# Patient Record
Sex: Female | Born: 1960 | Race: Black or African American | Hispanic: No | Marital: Married | Smoking: Never smoker
Health system: Southern US, Community
[De-identification: ages and names within clinical notes are randomized; demographics above are authoritative.]

## PROBLEM LIST (undated history)

## (undated) DIAGNOSIS — K219 Gastro-esophageal reflux disease without esophagitis: Secondary | ICD-10-CM

## (undated) DIAGNOSIS — H269 Unspecified cataract: Secondary | ICD-10-CM

## (undated) DIAGNOSIS — M359 Systemic involvement of connective tissue, unspecified: Secondary | ICD-10-CM

## (undated) DIAGNOSIS — M810 Age-related osteoporosis without current pathological fracture: Secondary | ICD-10-CM

## (undated) DIAGNOSIS — Z5189 Encounter for other specified aftercare: Secondary | ICD-10-CM

## (undated) DIAGNOSIS — E785 Hyperlipidemia, unspecified: Secondary | ICD-10-CM

## (undated) DIAGNOSIS — F419 Anxiety disorder, unspecified: Secondary | ICD-10-CM

## (undated) DIAGNOSIS — F32A Depression, unspecified: Secondary | ICD-10-CM

## (undated) DIAGNOSIS — M1990 Unspecified osteoarthritis, unspecified site: Secondary | ICD-10-CM

## (undated) DIAGNOSIS — D649 Anemia, unspecified: Secondary | ICD-10-CM

## (undated) DIAGNOSIS — K3532 Acute appendicitis with perforation and localized peritonitis, without abscess: Secondary | ICD-10-CM

## (undated) DIAGNOSIS — H409 Unspecified glaucoma: Secondary | ICD-10-CM

## (undated) DIAGNOSIS — G4730 Sleep apnea, unspecified: Secondary | ICD-10-CM

## (undated) HISTORY — DX: Unspecified glaucoma: H40.9

## (undated) HISTORY — PX: APPENDECTOMY: SHX54

## (undated) HISTORY — DX: Anxiety disorder, unspecified: F41.9

## (undated) HISTORY — DX: Systemic involvement of connective tissue, unspecified: M35.9

## (undated) HISTORY — DX: Depression, unspecified: F32.A

## (undated) HISTORY — DX: Unspecified osteoarthritis, unspecified site: M19.90

## (undated) HISTORY — DX: Hyperlipidemia, unspecified: E78.5

## (undated) HISTORY — DX: Age-related osteoporosis without current pathological fracture: M81.0

## (undated) HISTORY — PX: BREAST BIOPSY: SHX20

## (undated) HISTORY — DX: Encounter for other specified aftercare: Z51.89

## (undated) HISTORY — DX: Unspecified cataract: H26.9

## (undated) HISTORY — DX: Acute appendicitis with perforation and localized peritonitis, without abscess: K35.32

## (undated) HISTORY — DX: Sleep apnea, unspecified: G47.30

---
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
## 1983-09-22 HISTORY — PX: TUBAL LIGATION: SHX77

## 2011-12-07 ENCOUNTER — Emergency Department: Payer: Self-pay | Admitting: Emergency Medicine

## 2011-12-12 ENCOUNTER — Ambulatory Visit: Payer: Self-pay

## 2013-12-06 ENCOUNTER — Ambulatory Visit: Payer: Self-pay

## 2015-01-14 ENCOUNTER — Ambulatory Visit
Admit: 2015-01-14 | Disposition: A | Discharge status: Home / Self Care | Payer: Self-pay | Attending: Urgent Care | Admitting: Urgent Care

## 2015-01-18 ENCOUNTER — Other Ambulatory Visit: Payer: Self-pay | Admitting: Oncology

## 2015-01-18 DIAGNOSIS — R928 Other abnormal and inconclusive findings on diagnostic imaging of breast: Secondary | ICD-10-CM

## 2015-01-24 ENCOUNTER — Ambulatory Visit
Admission: RE | Admit: 2015-01-24 | Discharge: 2015-01-24 | Disposition: A | Discharge status: Home / Self Care | Payer: Self-pay | Source: Ambulatory Visit | Attending: Oncology | Admitting: Oncology

## 2015-01-24 DIAGNOSIS — R928 Other abnormal and inconclusive findings on diagnostic imaging of breast: Secondary | ICD-10-CM | POA: Insufficient documentation

## 2015-02-04 ENCOUNTER — Encounter: Payer: Self-pay | Admitting: *Deleted

## 2015-02-04 NOTE — Progress Notes (Signed)
Letter mailed from the [facility] to inform patient of her normal mammogram results.  Patient is to follow-up with annual screening in one year.  HSIS to Christy. 

## 2016-01-29 ENCOUNTER — Ambulatory Visit: Payer: Self-pay | Attending: Oncology | Admitting: *Deleted

## 2016-01-29 ENCOUNTER — Ambulatory Visit
Admission: RE | Admit: 2016-01-29 | Discharge: 2016-01-29 | Disposition: A | Discharge status: Home / Self Care | Payer: Self-pay | Source: Ambulatory Visit | Attending: Oncology | Admitting: Oncology

## 2016-01-29 ENCOUNTER — Encounter: Payer: Self-pay | Admitting: *Deleted

## 2016-01-29 VITALS — BP 117/97 | HR 90 | Temp 98.0°F | Ht 67.32 in | Wt 155.8 lb

## 2016-01-29 DIAGNOSIS — Z0000 Encounter for general adult medical examination without abnormal findings: Secondary | ICD-10-CM

## 2016-01-29 NOTE — Patient Instructions (Signed)
Gave patient hand-out, Women Staying Healthy, Active and Well from [facility], with education on breast health, pap smears, heart and colon health. 

## 2016-01-29 NOTE — Progress Notes (Signed)
Subjective:     Patient ID: Frances Davidson, female   DOB: 1960-12-26, 55 y.o.   MRN: [id number]  HPI   Review of Systems     Objective:   Physical Exam  Pulmonary/Chest: Right breast exhibits no inverted nipple, no mass, no nipple discharge, no skin change and no tenderness. Left breast exhibits no inverted nipple, no mass, no nipple discharge, no skin change and no tenderness. Breasts are asymmetrical.    Left breast larger than the right breast  Abdominal: There is no splenomegaly or hepatomegaly.  Genitourinary: Rectal exam shows external hemorrhoid. There is no rash, tenderness, lesion or injury on the right labia. There is no rash, tenderness, lesion or injury on the left labia. Cervix exhibits no motion tenderness, no discharge and no friability. Right adnexum displays no mass, no tenderness and no fullness. Left adnexum displays no mass, no tenderness and no fullness. No erythema, tenderness or bleeding in the vagina. No foreign body around the vagina. No signs of injury around the vagina. No vaginal discharge found.       Assessment:     55 year old Black female returns to [facility] for annual screening.  Husband is present during the interview and exam.  Clinical breast exam unremarkable.  Taught self breast awareness.  Pelvic exam without masses or lesions.  Last pap on 12/06/13 was HPV negative and normal.  There is an irregular shaped, dark mole at the left upper chest. Encouraged patient to follow up with a primary care provider for further evaluation of the mole.  Jeanella Anton to give patient numbers to the [facility].  Patient has been screened for eligibility.  She does not have any insurance, Medicare or Medicaid.  She also meets financial eligibility.  Hand-out given on the Affordable Care Act.     Plan:     Screening mammogram ordered.  Informed next pap will be due 2020.  Will follow-up per [facility] protocol

## 2016-01-30 ENCOUNTER — Encounter: Payer: Self-pay | Admitting: *Deleted

## 2016-01-30 NOTE — Progress Notes (Signed)
Letter mailed from the [facility] to inform patient of her normal mammogram results.  Patient is to follow-up with annual screening in one year.  HSIS to Christy. 

## 2016-07-22 DIAGNOSIS — K3532 Acute appendicitis with perforation and localized peritonitis, without abscess: Secondary | ICD-10-CM

## 2016-07-22 HISTORY — DX: Acute appendicitis with perforation, localized peritonitis, and gangrene, without abscess: K35.32

## 2016-08-16 ENCOUNTER — Emergency Department: Payer: Self-pay

## 2016-08-16 ENCOUNTER — Encounter: Payer: Self-pay | Admitting: Emergency Medicine

## 2016-08-16 ENCOUNTER — Inpatient Hospital Stay
Admission: EM | Admit: 2016-08-16 | Discharge: 2016-08-20 | DRG: 373 | Disposition: A | Discharge status: Home / Self Care | Payer: Self-pay | Attending: Surgery | Admitting: Surgery

## 2016-08-16 DIAGNOSIS — K5900 Constipation, unspecified: Secondary | ICD-10-CM | POA: Diagnosis present

## 2016-08-16 DIAGNOSIS — F17200 Nicotine dependence, unspecified, uncomplicated: Secondary | ICD-10-CM | POA: Diagnosis present

## 2016-08-16 DIAGNOSIS — K353 Acute appendicitis with localized peritonitis: Principal | ICD-10-CM | POA: Diagnosis present

## 2016-08-16 DIAGNOSIS — K3533 Acute appendicitis with perforation and localized peritonitis, with abscess: Secondary | ICD-10-CM | POA: Diagnosis present

## 2016-08-16 LAB — COMPREHENSIVE METABOLIC PANEL
ALT: 13 U/L — ABNORMAL LOW (ref 14–54)
ANION GAP: 9 (ref 5–15)
AST: 20 U/L (ref 15–41)
Albumin: 3.8 g/dL (ref 3.5–5.0)
Alkaline Phosphatase: 67 U/L (ref 38–126)
BUN: 10 mg/dL (ref 6–20)
CHLORIDE: 103 mmol/L (ref 101–111)
CO2: 27 mmol/L (ref 22–32)
CREATININE: 1.17 mg/dL — AB (ref 0.44–1.00)
Calcium: 8.9 mg/dL (ref 8.9–10.3)
GFR, EST AFRICAN AMERICAN: 59 mL/min — AB (ref >60)
GFR, EST NON AFRICAN AMERICAN: 51 mL/min — AB (ref >60)
Glucose, Bld: 110 mg/dL — ABNORMAL HIGH (ref 65–99)
Potassium: 3.6 mmol/L (ref 3.5–5.1)
SODIUM: 139 mmol/L (ref 135–145)
Total Bilirubin: 0.8 mg/dL (ref 0.3–1.2)
Total Protein: 8 g/dL (ref 6.5–8.1)

## 2016-08-16 LAB — URINALYSIS COMPLETE WITH MICROSCOPIC (ARMC ONLY)
BILIRUBIN URINE: NEGATIVE
Glucose, UA: NEGATIVE mg/dL
HGB URINE DIPSTICK: NEGATIVE
Nitrite: NEGATIVE
PH: 5 (ref 5.0–8.0)
PROTEIN: 30 mg/dL — AB
Specific Gravity, Urine: 1.027 (ref 1.005–1.030)

## 2016-08-16 LAB — CBC
HCT: 36.2 % (ref 35.0–47.0)
HEMOGLOBIN: 12.2 g/dL (ref 12.0–16.0)
MCH: 30.4 pg (ref 26.0–34.0)
MCHC: 33.7 g/dL (ref 32.0–36.0)
MCV: 90.2 fL (ref 80.0–100.0)
PLATELETS: 322 10*3/uL (ref 150–440)
RBC: 4.01 MIL/uL (ref 3.80–5.20)
RDW: 13.4 % (ref 11.5–14.5)
WBC: 14.7 10*3/uL — AB (ref 3.6–11.0)

## 2016-08-16 LAB — LIPASE, BLOOD: LIPASE: 14 U/L (ref 11–51)

## 2016-08-16 MED ORDER — SODIUM CHLORIDE 0.9% FLUSH
3.0000 mL | INTRAVENOUS | Status: DC | PRN
Start: 2016-08-16 — End: 2016-08-20

## 2016-08-16 MED ORDER — SODIUM CHLORIDE 0.9% FLUSH
3.0000 mL | Freq: Two times a day (BID) | INTRAVENOUS | Status: DC
  Administered 2016-08-16 – 2016-08-19 (×7): 3 mL via INTRAVENOUS

## 2016-08-16 MED ORDER — IOPAMIDOL (ISOVUE-300) INJECTION 61%
100.0000 mL | Freq: Once | INTRAVENOUS | Status: AC | PRN
Start: 2016-08-16 — End: 2016-08-16
  Administered 2016-08-16: 100 mL via INTRAVENOUS
  Filled 2016-08-16: qty 100

## 2016-08-16 MED ORDER — MORPHINE SULFATE (PF) 4 MG/ML IV SOLN: 2.0000 mg | INTRAVENOUS | Status: DC | PRN

## 2016-08-16 MED ORDER — MORPHINE SULFATE (PF) 4 MG/ML IV SOLN
4.0000 mg | Freq: Once | INTRAVENOUS | Status: AC
  Administered 2016-08-16: 4 mg via INTRAVENOUS
  Filled 2016-08-16: qty 1

## 2016-08-16 MED ORDER — ACETAMINOPHEN 325 MG PO TABS: 650.0000 mg | ORAL_TABLET | Freq: Four times a day (QID) | ORAL | Status: DC | PRN

## 2016-08-16 MED ORDER — ONDANSETRON HCL 4 MG/2ML IJ SOLN
4.0000 mg | Freq: Once | INTRAMUSCULAR | Status: AC
  Administered 2016-08-16: 4 mg via INTRAVENOUS
  Filled 2016-08-16: qty 2

## 2016-08-16 MED ORDER — PIPERACILLIN-TAZOBACTAM 3.375 G IVPB
3.3750 g | Freq: Three times a day (TID) | INTRAVENOUS | Status: DC
  Administered 2016-08-17 – 2016-08-19 (×7): 3.375 g via INTRAVENOUS
  Filled 2016-08-16 (×9): qty 50

## 2016-08-16 MED ORDER — HYDROMORPHONE HCL 1 MG/ML IJ SOLN
1.0000 mg | Freq: Once | INTRAMUSCULAR | Status: AC
  Administered 2016-08-16: 1 mg via INTRAVENOUS

## 2016-08-16 MED ORDER — HEPARIN SODIUM (PORCINE) 5000 UNIT/ML IJ SOLN
5000.0000 [IU] | Freq: Three times a day (TID) | INTRAMUSCULAR | Status: DC
  Administered 2016-08-16 – 2016-08-20 (×9): 5000 [IU] via SUBCUTANEOUS
  Filled 2016-08-16 (×10): qty 1

## 2016-08-16 MED ORDER — HYDROCODONE-ACETAMINOPHEN 5-325 MG PO TABS
1.0000 | ORAL_TABLET | ORAL | Status: DC | PRN
  Administered 2016-08-17 – 2016-08-18 (×2): 2 via ORAL
  Administered 2016-08-19: 1 via ORAL
  Filled 2016-08-16: qty 1
  Filled 2016-08-16: qty 2
  Filled 2016-08-16 (×2): qty 1

## 2016-08-16 MED ORDER — ONDANSETRON HCL 4 MG PO TABS
4.0000 mg | ORAL_TABLET | Freq: Four times a day (QID) | ORAL | Status: DC | PRN
  Filled 2016-08-16: qty 1

## 2016-08-16 MED ORDER — PIPERACILLIN-TAZOBACTAM 3.375 G IVPB 30 MIN
3.3750 g | Freq: Once | INTRAVENOUS | Status: AC
  Administered 2016-08-16: 3.375 g via INTRAVENOUS
  Filled 2016-08-16 (×2): qty 50

## 2016-08-16 MED ORDER — MORPHINE SULFATE (PF) 2 MG/ML IV SOLN: 2.0000 mg | INTRAVENOUS | Status: DC | PRN

## 2016-08-16 MED ORDER — HYDROMORPHONE HCL 1 MG/ML IJ SOLN
INTRAMUSCULAR | Status: AC
  Administered 2016-08-16: 1 mg via INTRAVENOUS
  Filled 2016-08-16: qty 1

## 2016-08-16 MED ORDER — SODIUM CHLORIDE 0.9 % IV SOLN: 250.0000 mL | INTRAVENOUS | Status: DC | PRN

## 2016-08-16 MED ORDER — ONDANSETRON HCL 4 MG/2ML IJ SOLN: 4.0000 mg | Freq: Four times a day (QID) | INTRAMUSCULAR | Status: DC | PRN

## 2016-08-16 MED ORDER — IOPAMIDOL (ISOVUE-300) INJECTION 61%
30.0000 mL | Freq: Once | INTRAVENOUS | Status: AC | PRN
  Administered 2016-08-16: 30 mL via ORAL
  Filled 2016-08-16: qty 30

## 2016-08-16 MED ORDER — SODIUM CHLORIDE 0.9 % IV BOLUS (SEPSIS)
1000.0000 mL | Freq: Once | INTRAVENOUS | Status: AC
  Administered 2016-08-16: 1000 mL via INTRAVENOUS

## 2016-08-16 NOTE — ED Triage Notes (Signed)
Patient to ER for c/o RLQ abd pain. States she has not had BM in 1.5 weeks. Patient reports feeling "bulge" to RLQ that is painful. Patient has been taking medications to help with BM, but has been unsuccessful.

## 2016-08-16 NOTE — ED Notes (Signed)
ED Provider at bedside. 

## 2016-08-16 NOTE — ED Provider Notes (Signed)
[facility] Emergency Department Provider Note  Time seen: 2:19 PM  I have reviewed the triage vital signs and the nursing notes.   HISTORY  Chief Complaint Abdominal Pain    HPI Frances Davidson is a 55 y.o. female presents to the emergency department with right-sided abdominal pain and constipation. According to the patient for the past 1.5 weeks she has been constipated unable to have a bowel movement. States she has now developed right sided abdominal pain over the past few days which has progressively worsen. States she is belching a lot and feeling nauseated but has not vomited. Denies fever but states she has been experiencing chills at home. Denies dysuria. States prior to the constipation she was having very hard small bowel movements. Patient has taken a laxative without any relief. Describes the pain as an 8/10 dull aching pain on the right side of her abdomen.  History reviewed. No pertinent past medical history.  There are no active problems to display for this patient.   Past Surgical History:  Procedure Laterality Date  . BREAST BIOPSY Left     Prior to Admission medications   Not on File    No Known Allergies  No family history on file.  Social History Social History  Substance Use Topics  . Smoking status: Current Some Day Smoker  . Smokeless tobacco: Never Used  . Alcohol use No    Review of Systems Constitutional: Negative for fever. Cardiovascular: Negative for chest pain. Respiratory: Negative for shortness of breath. Gastrointestinal: Right-sided abdominal pain, nausea, positive for constipation. Negative for vomiting or diarrhea. Genitourinary: Negative for dysuria. Musculoskeletal: Negative for back pain. Neurological: Negative for headache 10-point ROS otherwise negative.  ____________________________________________   PHYSICAL EXAM:  VITAL SIGNS: ED Triage Vitals  Enc Vitals Group     BP 08/16/16 1315 109/81      Pulse Rate 08/16/16 1315 (!) 108     Resp 08/16/16 1315 20     Temp 08/16/16 1315 99 F (37.2 C)     Temp Source 08/16/16 1315 Oral     SpO2 08/16/16 1315 98 %     Weight 08/16/16 1316 155 lb (70.3 kg)     Height 08/16/16 1316 5\' 6"  (1.676 m)     Head Circumference --      Peak Flow --      Pain Score 08/16/16 1316 8     Pain Loc --      Pain Edu? --      Excl. in [place]? --     Constitutional: Alert and oriented. Well appearing and in no distress. Eyes: Normal exam ENT   Head: Normocephalic and atraumatic.   Mouth/Throat: Mucous membranes are moist. Cardiovascular: Normal rate, regular rhythm. No murmur Respiratory: Normal respiratory effort without tachypnea nor retractions. Breath sounds are clear  Gastrointestinal: Soft, moderate right upper and right lower quadrant tenderness palpation. No rebound or guarding. No left-sided tenderness. Musculoskeletal: Nontender with normal range of motion in all extremities.  Neurologic:  Normal speech and language. No gross focal neurologic deficits  Skin:  Skin is warm, dry and intact.  Psychiatric: Mood and affect are normal. Speech and behavior are normal.   ____________________________________________     RADIOLOGY  CT shows likely appendiceal abscess.  ____________________________________________   INITIAL IMPRESSION / ASSESSMENT AND PLAN / ED COURSE  Pertinent labs & imaging results that were available during my care of the patient were reviewed by me and considered in my medical decision making (  see chart for details).  The patient presents the emergency department with constipation right-sided abdominal pain. Patient has moderate tenderness to the entire right side of her abdomen, no rebound or guarding or distention. Active bowel sounds on examination. Denies any prior abdominal surgeries. Patient has a leukocytosis of 14,000, otherwise normal labs. Equivocal urinalysis we will send for urine culture. We will  proceed with a CT abdomen/pelvis to further evaluate.  CT shows a fluid collection in the right lower quadrant most consistent with appendiceal abscess. I discussed the patient with Dr. Burt Knack who will review the CT scan and be down to see the patient. I discussed the results with the patient.  Dr. Burt Knack will be admitting for IV antibiotics, and possible drainage.  ____________________________________________   FINAL CLINICAL IMPRESSION(S) / ED DIAGNOSES  Abdominal pain Constipation Appendiceal abscess   Harvest Dark, MD 08/16/16 [phone]

## 2016-08-16 NOTE — ED Notes (Signed)
Patient returned from radiology

## 2016-08-16 NOTE — H&P (Signed)
Frances Davidson is an 55 y.o. female.    Chief Complaint: Abdominal pain  HPI: This a patient with a week and a half of abdominal pain in the right lower quadrant she states she remembers a getting worse and then severe and then better but she's been having fevers and chills nausea but no emesis and she feels constipated. She also gets bloated after eating and it takes a while to subside. She's had some urinary pressure but no pain and no hematuria  A workup in the emergency room suggests ruptured appendicitis with periappendiceal abscess.  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  Family history noncontributory  History reviewed. No pertinent past medical history.  Past Surgical History:  Procedure Laterality Date  . BREAST BIOPSY Left     No family history on file. Social History:  reports that she has been smoking.  She has never used smokeless tobacco. She reports that she does not drink alcohol. Her drug history is not on file.  Allergies: No Known Allergies   (Not in a hospital admission)   Review of Systems  Constitutional: Positive for chills, diaphoresis, fever and malaise/fatigue. Negative for weight loss.  HENT: Negative.   Eyes: Negative.   Respiratory: Negative.   Cardiovascular: Negative.   Gastrointestinal: Positive for abdominal pain, constipation and nausea. Negative for blood in stool, diarrhea, heartburn, melena and vomiting.  Genitourinary: Negative for dysuria and hematuria.  Musculoskeletal: Negative.   Skin: Negative.   Neurological: Negative.   Endo/Heme/Allergies: Negative.   Psychiatric/Behavioral: Negative.      Physical Exam:  BP 109/81   Pulse (!) 108   Temp 99 F (37.2 C) (Oral)   Resp 20   Ht 5' 6"   (1.676 m)   Wt 155 lb (70.3 kg)   SpO2 98%   BMI 25.02 kg/m   Physical Exam  Constitutional: She is oriented to person, place, and time and well-developed, well-nourished, and in no distress. No distress.  HENT:  Head: Normocephalic and atraumatic.  Eyes: Pupils are equal, round, and reactive to light. Right eye exhibits no discharge. Left eye exhibits no discharge. No scleral icterus.  Neck: Normal range of motion.  Cardiovascular: Normal rate, regular rhythm and normal heart sounds.   Pulmonary/Chest: Effort normal. No respiratory distress. She has no wheezes. She has no rales.  Abdominal: Soft. She exhibits distension and mass. There is tenderness. There is guarding. There is no rebound.  Maximal tenderness in the right lower quadrant and right upper quadrant with mass effect and fullness.  Musculoskeletal: Normal range of motion. She exhibits no edema or tenderness.  Lymphadenopathy:    She has no cervical adenopathy.  Neurological: She is alert and oriented to person, place, and time.  Skin: Skin is warm and dry. No rash noted. She is not diaphoretic. No erythema.  Psychiatric: Mood and affect normal.  Vitals reviewed.       Results for orders placed or performed during the hospital encounter of 08/16/16 (from the past 48 hour(s))  Lipase, blood     Status: None   Collection Time: 08/16/16  1:20 PM  Result Value Ref Range   Lipase 14 11 - 51 U/L  Comprehensive metabolic panel     Status: Abnormal   Collection  Time: 08/16/16  1:20 PM  Result Value Ref Range   Sodium 139 135 - 145 mmol/L   Potassium 3.6 3.5 - 5.1 mmol/L   Chloride 103 101 - 111 mmol/L   CO2 27 22 - 32 mmol/L   Glucose, Bld 110 (H) 65 - 99 mg/dL   BUN 10 6 - 20 mg/dL   Creatinine, Ser 1.17 (H) 0.44 - 1.00 mg/dL   Calcium 8.9 8.9 - 10.3 mg/dL   Total Protein 8.0 6.5 - 8.1 g/dL   Albumin 3.8 3.5 - 5.0 g/dL   AST 20 15 - 41 U/L   ALT 13 (L) 14 - 54 U/L   Alkaline Phosphatase 67 38 - 126 U/L   Total  Bilirubin 0.8 0.3 - 1.2 mg/dL   GFR calc non Af Amer 51 (L) >60 mL/min   GFR calc Af Amer 59 (L) >60 mL/min    Comment: (NOTE) The eGFR has been calculated using the CKD EPI equation. This calculation has not been validated in all clinical situations. eGFR's persistently <60 mL/min signify possible Chronic Kidney Disease.    Anion gap 9 5 - 15  CBC     Status: Abnormal   Collection Time: 08/16/16  1:20 PM  Result Value Ref Range   WBC 14.7 (H) 3.6 - 11.0 K/uL   RBC 4.01 3.80 - 5.20 MIL/uL   Hemoglobin 12.2 12.0 - 16.0 g/dL   HCT 36.2 35.0 - 47.0 %   MCV 90.2 80.0 - 100.0 fL   MCH 30.4 26.0 - 34.0 pg   MCHC 33.7 32.0 - 36.0 g/dL   RDW 13.4 11.5 - 14.5 %   Platelets 322 150 - 440 K/uL  Urinalysis complete, with microscopic     Status: Abnormal   Collection Time: 08/16/16  1:20 PM  Result Value Ref Range   Color, Urine AMBER (A) YELLOW   APPearance HAZY (A) CLEAR   Glucose, UA NEGATIVE NEGATIVE mg/dL   Bilirubin Urine NEGATIVE NEGATIVE   Ketones, ur TRACE (A) NEGATIVE mg/dL   Specific Gravity, Urine 1.027 1.005 - 1.030   Hgb urine dipstick NEGATIVE NEGATIVE   pH 5.0 5.0 - 8.0   Protein, ur 30 (A) NEGATIVE mg/dL   Nitrite NEGATIVE NEGATIVE   Leukocytes, UA 1+ (A) NEGATIVE   RBC / HPF 0-5 0 - 5 RBC/hpf   WBC, UA 6-30 0 - 5 WBC/hpf   Bacteria, UA RARE (A) NONE SEEN   Squamous Epithelial / LPF 6-30 (A) NONE SEEN   Mucous PRESENT    Dg Abdomen 1 View  Result Date: 08/16/2016 CLINICAL DATA:  Last normal BM was 1.5 weeks ago. Pt states she has been taking laxatives off/on and it helps some but the feeling doesn't go completely away. Pain mostly on the right lower quadrant area. EXAM: ABDOMEN - 1 VIEW COMPARISON:  None. FINDINGS: Bowel gas pattern is nonobstructive. No evidence of soft tissue mass or abnormal fluid collection. No evidence of free intraperitoneal air, although the upper most portion of the abdomen is excluded on this exam. Mild degenerative change noted within the  slightly scoliotic lumbar spine. No acute or suspicious osseous finding. Dystrophic calcifications within the central pelvis are suspected uterine fibroids. Additional small vascular phleboliths are present within the lower pelvis. IMPRESSION: No acute findings.  Nonobstructive bowel gas pattern. Electronically Signed   By: Franki Cabot M.D.   On: 08/16/2016 14:06   Ct Abdomen Pelvis W Contrast  Result Date: 08/16/2016 CLINICAL DATA:  Constipation.  Right lower  quadrant abdominal pain. EXAM: CT ABDOMEN AND PELVIS WITH CONTRAST TECHNIQUE: Multidetector CT imaging of the abdomen and pelvis was performed using the standard protocol following bolus administration of intravenous contrast. CONTRAST:  192m ISOVUE-300 IOPAMIDOL (ISOVUE-300) INJECTION 61%, 344mISOVUE-300 IOPAMIDOL (ISOVUE-300) INJECTION 61% COMPARISON:  One-view abdomen radiographs from the same day. FINDINGS: Lower chest: Mild dependent atelectasis is present bilaterally. There is no focal nodule, mass, or airspace disease. The distal esophagus is mildly dilated. The heart size is normal. No significant pleural or pericardial effusion is present. Hepatobiliary: Liver is within normal limits. There there are no focal lesions. Liver contour is smooth. The common bile duct and gallbladder are within normal limits. Pancreas: No significant inflammatory changes are present. There is mild pancreatic duct dilation without obstructing mass or stone. No solid or cystic mass lesion is present. Spleen: Within normal limits. Adrenals/Urinary Tract: The adrenal glands are normal bilaterally. Kidneys are somewhat under rotated bilaterally. Extra renal pelvis is noted bilaterally. There is slight dilation of the collecting system bilaterally which is likely due to rotation. Inflammatory changes are present about the right ureter. An ill-defined right hilar collection measures 2.5 x 2.0 x 1.9 cm. Stomach/Bowel: The stomach and duodenum are within normal limits. The  small bowel is unremarkable. A multiloculated enhancing collection is present in the right lower quadrant at the level of the ileocecal valve and appendix. The collection measures 7.5 x 2.9 x 4.1 cm. The descending colon is mostly fluid filled. The transverse colon is unremarkable. Diverticular changes are present within the sigmoid colon without focal inflammation to suggest diverticulitis. Vascular/Lymphatic: No significant retroperitoneal adenopathy is present. No significant vascular calcifications or aneurysm are evident. Reproductive: Multiple calcified fibroids are present in the uterus. The right ovary appears separate from the multiloculated enhancing collection or mass. The left ovary is within normal limits. Other: No significant free fluid or free air is present. Musculoskeletal: Mild leftward curvature is present and AA upper lumbar spine. There is pseudoarticulation of the right transverse process of L5 and the sacral ala. No focal lytic or blastic lesions are present. Vertebral body heights and AP alignment are maintained. IMPRESSION: 1. Multiloculated enhance seen mass lesion or collection at the level of the ileocecal valve and appendix measures 7.5 x 2.9 x 4.1 cm. This is most concerning for an appendiceal abscess. A tubo-ovarian abscess is considered less likely. This appears inflammatory rather than neoplastic. There does appear to be some inflammation ascending along the right ureter. 2. A second low-density peripherally enhancing collection is present at the right renal hilum measuring 2.5 x 2.0 x 1.9 cm. This this likely represents a a necrotic lymph node along the ileocolonic ligament. Recommend follow-up CT after resolution or intervention for the larger right lower quadrant process. 3. Incomplete rotation of the kidneys bilaterally without evidence for obstruction. 4. Sigmoid diverticulosis without diverticulitis. These results were called by telephone at the time of interpretation on  08/16/2016 at 4:32 pm to Dr. KEHarvest Dark who verbally acknowledged these results. Electronically Signed   By: ChSan Morelle.D.   On: 08/16/2016 16:32     Assessment/Plan  This patient with a likely ruptured appendix CT scan is been personally reviewed suggesting a multiloculated phlegmon made up of small abscesses that are not amenable to drainage. This would be the worst possible time to operate on this patient. My recommendations would be for IV antibiotic therapy for a few days and then repeat his CT scan to see if there is any drainable  collection. At this point it is not amenable to drainage due to the small nature of the individual and multiple abscesses. This was discussed with the emergency room physician as well as with patient and husband they understood and agreed with this plan. I also discussed the fact that she's having nausea but no emesis but the CT scan suggests that this could easily progress to a bowel obstruction in which case nasogastric and TPN may be required.  Florene Glen, MD, FACS

## 2016-08-17 LAB — CBC
HEMATOCRIT: 31.3 % — AB (ref 35.0–47.0)
Hemoglobin: 10.4 g/dL — ABNORMAL LOW (ref 12.0–16.0)
MCH: 30 pg (ref 26.0–34.0)
MCHC: 33.3 g/dL (ref 32.0–36.0)
MCV: 90 fL (ref 80.0–100.0)
PLATELETS: 251 10*3/uL (ref 150–440)
RBC: 3.48 MIL/uL — ABNORMAL LOW (ref 3.80–5.20)
RDW: 13.4 % (ref 11.5–14.5)
WBC: 10.9 10*3/uL (ref 3.6–11.0)

## 2016-08-17 LAB — URINE CULTURE: Culture: <10000 — AB

## 2016-08-17 LAB — BASIC METABOLIC PANEL
Anion gap: 6 (ref 5–15)
BUN: 7 mg/dL (ref 6–20)
CHLORIDE: 104 mmol/L (ref 101–111)
CO2: 27 mmol/L (ref 22–32)
CREATININE: 0.99 mg/dL (ref 0.44–1.00)
Calcium: 8.3 mg/dL — ABNORMAL LOW (ref 8.9–10.3)
GFR calc Af Amer: >60 mL/min (ref >60)
GFR calc non Af Amer: >60 mL/min (ref >60)
GLUCOSE: 87 mg/dL (ref 65–99)
POTASSIUM: 3.7 mmol/L (ref 3.5–5.1)
SODIUM: 137 mmol/L (ref 135–145)

## 2016-08-17 MED ORDER — ALUM & MAG HYDROXIDE-SIMETH 200-200-20 MG/5ML PO SUSP
30.0000 mL | ORAL | Status: DC | PRN
Start: 2016-08-17 — End: 2016-08-20
  Administered 2016-08-17 – 2016-08-18 (×2): 30 mL via ORAL
  Filled 2016-08-17 (×2): qty 30

## 2016-08-17 NOTE — Progress Notes (Signed)
Initial Nutrition Assessment  DOCUMENTATION CODES:   Not applicable  INTERVENTION:  1. Monitor intake on CLD, diet advancement per MD/PA/NP 2. Recommend Boost Breeze po TID, each supplement provides 250 kcal and 9 grams of protein while on CLD  NUTRITION DIAGNOSIS:   Inadequate oral intake related to nausea, poor appetite as evidenced by per patient/family report.  GOAL:   Patient will meet greater than or equal to 90% of their needs  MONITOR:   PO intake, Supplement acceptance, I & O's, Labs, Weight trends  REASON FOR ASSESSMENT:   Malnutrition Screening Tool    ASSESSMENT:   This a patient with a week and a half of abdominal pain in the right lower quadrant she states she remembers a getting worse and then severe and then better but she's been having fevers and chills nausea but no emesis and she feels constipated. Believed to be a ruptured appendix by MD. Waiting for drainable abscesses  Spoke with patient, Husband at bedside. She reports good PO intake PTA up until about 1.5 weeks ago when she started feeling constipated, nauseated. Decreased PO intake to 2 small meals per day.  Reports a normal weight of 162-165# States she lost 7# over 1.5 weeks, Husband did not believe this. Would be a 7#/4.3% severe wt loss over 1.5 weeks. Pt had grits, bacon, eggs for breakfast.  Ate 50%, Denies nausea at this time, but continues with "tightness," mild distension in her abdomen. She states MD was going to put her on a CLD after Lunch, but current diet order is clear liquids. Nutrition-Focused physical exam completed. Findings are no fat depletion, no muscle depletion, and no edema.  Labs and medications reviewed  Diet Order:  Diet clear liquid Room service appropriate? Yes; Fluid consistency: Thin  Skin:  Reviewed, no issues  Last BM:  11/27  Height:   Ht Readings from Last 1 Encounters:  08/16/16 5\' 6"  (1.676 m)    Weight:   Wt Readings from Last 1 Encounters:   08/16/16 154 lb 4.8 oz (70 kg)    Ideal Body Weight:  59.09 kg  BMI:  Body mass index is 24.9 kg/m.  Estimated Nutritional Needs:   Kcal:  1750-2100 calories  Protein:  70-84 gm  Fluid:  >/= 1.75L  EDUCATION NEEDS:   No education needs identified at this time  Satira Anis. Francess Mullen, MS, RD LDN Inpatient Clinical Dietitian Pager [phone]

## 2016-08-17 NOTE — Progress Notes (Signed)
08/17/2016  Subjective: No acute events overnight. Patient reports that her pain is improved on the right lower quadrant. She did tolerate regular diet this morning but felt some tightness on the right lower side afterwards. Denies any fevers or chills.  Vital signs: Temp:  [98.1 F (36.7 C)-99 F (37.2 C)] 99 F (37.2 C) (11/27 1300) Pulse Rate:  [84-92] 92 (11/27 1300) Resp:  [16-18] 18 (11/27 1300) BP: (99-102)/(65-76) 102/68 (11/27 1300) SpO2:  [96 %-98 %] 97 % (11/27 1300) Weight:  [70 kg (154 lb 4.8 oz)] 70 kg (154 lb 4.8 oz) (11/26 1911)   Intake/Output: 11/26 0701 - 11/27 0700 In: 50 [IV Piggyback:50] Out: 450 [Urine:450] Last BM Date: 08/17/16  Physical Exam: Constitutional: No acute distress Abdomen: Soft, tender to palpation in the right lower quadrant but the patient reports this has improved compared to yesterday.  Labs:   Recent Labs  08/16/16 1320 08/17/16 0614  WBC 14.7* 10.9  HGB 12.2 10.4*  HCT 36.2 31.3*  PLT 322 251    Recent Labs  08/16/16 1320 08/17/16 0614  NA 139 137  K 3.6 3.7  CL 103 104  CO2 27 27  GLUCOSE 110* 87  BUN 10 7  CREATININE 1.17* 0.99  CALCIUM 8.9 8.3*   No results for input(s): LABPROT, INR in the last 72 hours.  Imaging: Ct Abdomen Pelvis W Contrast  Result Date: 08/16/2016 CLINICAL DATA:  Constipation.  Right lower quadrant abdominal pain. EXAM: CT ABDOMEN AND PELVIS WITH CONTRAST TECHNIQUE: Multidetector CT imaging of the abdomen and pelvis was performed using the standard protocol following bolus administration of intravenous contrast. CONTRAST:  168mL ISOVUE-300 IOPAMIDOL (ISOVUE-300) INJECTION 61%, 35mL ISOVUE-300 IOPAMIDOL (ISOVUE-300) INJECTION 61% COMPARISON:  One-view abdomen radiographs from the same day. FINDINGS: Lower chest: Mild dependent atelectasis is present bilaterally. There is no focal nodule, mass, or airspace disease. The distal esophagus is mildly dilated. The heart size is normal. No significant  pleural or pericardial effusion is present. Hepatobiliary: Liver is within normal limits. There there are no focal lesions. Liver contour is smooth. The common bile duct and gallbladder are within normal limits. Pancreas: No significant inflammatory changes are present. There is mild pancreatic duct dilation without obstructing mass or stone. No solid or cystic mass lesion is present. Spleen: Within normal limits. Adrenals/Urinary Tract: The adrenal glands are normal bilaterally. Kidneys are somewhat under rotated bilaterally. Extra renal pelvis is noted bilaterally. There is slight dilation of the collecting system bilaterally which is likely due to rotation. Inflammatory changes are present about the right ureter. An ill-defined right hilar collection measures 2.5 x 2.0 x 1.9 cm. Stomach/Bowel: The stomach and duodenum are within normal limits. The small bowel is unremarkable. A multiloculated enhancing collection is present in the right lower quadrant at the level of the ileocecal valve and appendix. The collection measures 7.5 x 2.9 x 4.1 cm. The descending colon is mostly fluid filled. The transverse colon is unremarkable. Diverticular changes are present within the sigmoid colon without focal inflammation to suggest diverticulitis. Vascular/Lymphatic: No significant retroperitoneal adenopathy is present. No significant vascular calcifications or aneurysm are evident. Reproductive: Multiple calcified fibroids are present in the uterus. The right ovary appears separate from the multiloculated enhancing collection or mass. The left ovary is within normal limits. Other: No significant free fluid or free air is present. Musculoskeletal: Mild leftward curvature is present and AA upper lumbar spine. There is pseudoarticulation of the right transverse process of L5 and the sacral ala. No focal  lytic or blastic lesions are present. Vertebral body heights and AP alignment are maintained. IMPRESSION: 1. Multiloculated  enhance seen mass lesion or collection at the level of the ileocecal valve and appendix measures 7.5 x 2.9 x 4.1 cm. This is most concerning for an appendiceal abscess. A tubo-ovarian abscess is considered less likely. This appears inflammatory rather than neoplastic. There does appear to be some inflammation ascending along the right ureter. 2. A second low-density peripherally enhancing collection is present at the right renal hilum measuring 2.5 x 2.0 x 1.9 cm. This this likely represents a a necrotic lymph node along the ileocolonic ligament. Recommend follow-up CT after resolution or intervention for the larger right lower quadrant process. 3. Incomplete rotation of the kidneys bilaterally without evidence for obstruction. 4. Sigmoid diverticulosis without diverticulitis. These results were called by telephone at the time of interpretation on 08/16/2016 at 4:32 pm to Dr. Harvest Dark , who verbally acknowledged these results. Electronically Signed   By: San Morelle M.D.   On: 08/16/2016 16:32    Assessment/Plan: 55 year old female with rupture appendicitis with appendiceal abscess  -We'll back down the patient to clear liquid diet at this point given the mild soreness that she had after her regular diet this morning. Continue IV fluid hydration and IV antibiotics. -Repeat labs in the morning. There may be a need for CT scan over the next couple of days to reevaluate her abdomen to see if there is anything that could be drained.   Melvyn Neth, [place]

## 2016-08-17 NOTE — Progress Notes (Signed)
Pt alert with husband in room. Husband raised question about finances and insurance. CH is available.   08/17/16 1125  Clinical Encounter Type  Visited With Patient and family together  Visit Type Initial  Referral From Nurse  Spiritual Encounters  Spiritual Needs Emotional  Stress Factors  Patient Stress Factors None identified  Family Stress Factors Financial concerns

## 2016-08-18 LAB — CBC WITH DIFFERENTIAL/PLATELET
Basophils Absolute: 0.1 10*3/uL (ref 0–0.1)
Basophils Relative: 1 %
EOS PCT: 2 %
Eosinophils Absolute: 0.2 10*3/uL (ref 0–0.7)
HEMATOCRIT: 33.9 % — AB (ref 35.0–47.0)
Hemoglobin: 11.3 g/dL — ABNORMAL LOW (ref 12.0–16.0)
LYMPHS ABS: 1.6 10*3/uL (ref 1.0–3.6)
LYMPHS PCT: 15 %
MCH: 29.8 pg (ref 26.0–34.0)
MCHC: 33.2 g/dL (ref 32.0–36.0)
MCV: 89.7 fL (ref 80.0–100.0)
MONO ABS: 0.6 10*3/uL (ref 0.2–0.9)
Monocytes Relative: 5 %
NEUTROS ABS: 8.4 10*3/uL — AB (ref 1.4–6.5)
Neutrophils Relative %: 77 %
PLATELETS: 293 10*3/uL (ref 150–440)
RBC: 3.78 MIL/uL — AB (ref 3.80–5.20)
RDW: 13.4 % (ref 11.5–14.5)
WBC: 10.8 10*3/uL (ref 3.6–11.0)

## 2016-08-18 MED ORDER — PANTOPRAZOLE SODIUM 40 MG PO TBEC
40.0000 mg | DELAYED_RELEASE_TABLET | Freq: Every day | ORAL | Status: DC
  Administered 2016-08-18 – 2016-08-20 (×3): 40 mg via ORAL
  Filled 2016-08-18 (×3): qty 1

## 2016-08-18 NOTE — Progress Notes (Signed)
08/18/2016  Subjective: No acute events overnight. She reports that she still having some mild discomfort in the right lower quadrant but the pain has not worsened. Has not had any fevers or chills. Denies any nausea and has had loose bowel movements.  Vital signs: Temp:  [99 F (37.2 C)-99.3 F (37.4 C)] 99.3 F (37.4 C) (11/28 0501) Pulse Rate:  [87-94] 87 (11/28 0501) Resp:  [18-22] 22 (11/28 0501) BP: (102-124)/(63-74) 108/74 (11/28 0501) SpO2:  [96 %-99 %] 96 % (11/28 0501)   Intake/Output: 11/27 0701 - 11/28 0700 In: 2221 [P.O.:2040; IV Piggyback:181] Out: 1250 [Urine:1250] Last BM Date: 08/17/16  Physical Exam: Constitutional: No acute distress Abdomen: Soft, nondistended, with mild tenderness to palpation in the right lower quadrant. No peritoneal signs  Labs:   Recent Labs  08/17/16 0614 08/18/16 0452  WBC 10.9 10.8  HGB 10.4* 11.3*  HCT 31.3* 33.9*  PLT 251 293    Recent Labs  08/16/16 1320 08/17/16 0614  NA 139 137  K 3.6 3.7  CL 103 104  CO2 27 27  GLUCOSE 110* 87  BUN 10 7  CREATININE 1.17* 0.99  CALCIUM 8.9 8.3*   No results for input(s): LABPROT, INR in the last 72 hours.  Imaging: No results found.  Assessment/Plan: 55 year old female with the ruptured appendicitis and associated abscess  -Patient currently stable and will advance her diet to regular diet this morning. -Patient's white blood cell count is stable although has stayed at 10.8. We'll recheck her white blood cell count tomorrow and pending on this she may require a repeat CT scan to assess the right lower quadrant abscess noted on CT scan.   Melvyn Neth, [place]

## 2016-08-19 LAB — CBC WITH DIFFERENTIAL/PLATELET
BASOS PCT: 1 %
Basophils Absolute: 0.1 10*3/uL (ref 0–0.1)
Eosinophils Absolute: 0.2 10*3/uL (ref 0–0.7)
Eosinophils Relative: 2 %
HEMATOCRIT: 31.8 % — AB (ref 35.0–47.0)
HEMOGLOBIN: 10.5 g/dL — AB (ref 12.0–16.0)
Lymphocytes Relative: 14 %
Lymphs Abs: 1.3 10*3/uL (ref 1.0–3.6)
MCH: 29.8 pg (ref 26.0–34.0)
MCHC: 33.1 g/dL (ref 32.0–36.0)
MCV: 90 fL (ref 80.0–100.0)
MONOS PCT: 5 %
Monocytes Absolute: 0.5 10*3/uL (ref 0.2–0.9)
NEUTROS ABS: 6.9 10*3/uL — AB (ref 1.4–6.5)
NEUTROS PCT: 78 %
Platelets: 279 10*3/uL (ref 150–440)
RBC: 3.54 MIL/uL — ABNORMAL LOW (ref 3.80–5.20)
RDW: 13.1 % (ref 11.5–14.5)
WBC: 8.9 10*3/uL (ref 3.6–11.0)

## 2016-08-19 MED ORDER — AMOXICILLIN-POT CLAVULANATE 875-125 MG PO TABS
1.0000 | ORAL_TABLET | Freq: Two times a day (BID) | ORAL | Status: DC
  Administered 2016-08-19 – 2016-08-20 (×3): 1 via ORAL
  Filled 2016-08-19 (×3): qty 1

## 2016-08-19 NOTE — Progress Notes (Signed)
08/19/2016  Subjective: No acute events overnight.  No worsening pain.  Tolerating regular diet well and continues with bowel function.  Does report occasional gas pain.  No nausea/vomiting.  Vital signs: Temp:  [98.3 F (36.8 C)-99 F (37.2 C)] 99 F (37.2 C) (11/29 0533) Pulse Rate:  [83-96] 96 (11/29 0533) Resp:  [18] 18 (11/29 0533) BP: (82-102)/(57-72) 102/72 (11/29 0812) SpO2:  [95 %-98 %] 96 % (11/29 0533)   Intake/Output: 11/28 0701 - 11/29 0700 In: 853.6 [P.O.:720; I.V.:93.6; IV Piggyback:40] Out: 500 [Urine:500] Last BM Date: 08/18/16  Physical Exam: Constitutional: No acute distress Abdomen:  Soft, nondistened, nontender to palpation.  Labs:   Recent Labs  08/18/16 0452 08/19/16 0450  WBC 10.8 8.9  HGB 11.3* 10.5*  HCT 33.9* 31.8*  PLT 293 279    Recent Labs  08/16/16 1320 08/17/16 0614  NA 139 137  K 3.6 3.7  CL 103 104  CO2 27 27  GLUCOSE 110* 87  BUN 10 7  CREATININE 1.17* 0.99  CALCIUM 8.9 8.3*   No results for input(s): LABPROT, INR in the last 72 hours.  Imaging: No results found.  Assessment/Plan: 55 yo female with ruptured appendicitis with abscess.  Her WBC today has continued to improve.  --Will change antibiotics to po Augmentin today. --Continue regular diet today. --Possible discharge tomorrow.   Melvyn Neth, [place]

## 2016-08-20 LAB — CBC WITH DIFFERENTIAL/PLATELET
BASOS ABS: 0.1 10*3/uL (ref 0–0.1)
Basophils Relative: 1 %
EOS PCT: 2 %
Eosinophils Absolute: 0.2 10*3/uL (ref 0–0.7)
HEMATOCRIT: 31.6 % — AB (ref 35.0–47.0)
Hemoglobin: 10.9 g/dL — ABNORMAL LOW (ref 12.0–16.0)
LYMPHS ABS: 1.4 10*3/uL (ref 1.0–3.6)
LYMPHS PCT: 19 %
MCH: 31.2 pg (ref 26.0–34.0)
MCHC: 34.4 g/dL (ref 32.0–36.0)
MCV: 90.7 fL (ref 80.0–100.0)
MONO ABS: 0.5 10*3/uL (ref 0.2–0.9)
MONOS PCT: 6 %
NEUTROS ABS: 5.2 10*3/uL (ref 1.4–6.5)
Neutrophils Relative %: 72 %
Platelets: 308 10*3/uL (ref 150–440)
RBC: 3.49 MIL/uL — ABNORMAL LOW (ref 3.80–5.20)
RDW: 13.6 % (ref 11.5–14.5)
WBC: 7.3 10*3/uL (ref 3.6–11.0)

## 2016-08-20 MED ORDER — HYDROCODONE-ACETAMINOPHEN 5-325 MG PO TABS: 1.0000 | ORAL_TABLET | ORAL | 0 refills | Status: DC | PRN

## 2016-08-20 MED ORDER — AMOXICILLIN-POT CLAVULANATE 875-125 MG PO TABS: 1.0000 | ORAL_TABLET | Freq: Two times a day (BID) | ORAL | 0 refills | Status: AC

## 2016-08-20 MED ORDER — PANTOPRAZOLE SODIUM 40 MG PO TBEC: 40.0000 mg | DELAYED_RELEASE_TABLET | Freq: Every day | ORAL | 3 refills | Status: DC

## 2016-08-20 NOTE — Progress Notes (Signed)
Patient is alert and oriented and ambulates without assistance, patient denies pain at this time. IV saline lock discontinued site without s/s of infiltration or infection. No acute distress noted. Patient given discharge instructions, follow up appointment, and prescription as ordered. Husband at the bedside to take patient home. Vital signs stable

## 2016-08-20 NOTE — Discharge Summary (Signed)
Patient ID: Kiri Molinaro MRN: [id number] DOB/AGE: August 01, 1961 55 y.o.  Admit date: 08/16/2016 Discharge date: 08/20/2016   Discharge Diagnoses:  Active Problems:   Appendiceal abscess   Procedures: None  Hospital Course: Patient was admitted on 11/26 with rupture appendicitis with a large abscess/inflammatory area in the right lower quadrant. She was initially made nothing by mouth with IV fluid hydration and IV antibiotics. Her white blood cell count was 14.7 on admission and eventually normalized down to 7.3 prior to discharge. Her diet was slowly advanced and then she was also transitioned to oral antibiotics. By 11/30 the patient was tolerating a regular diet with none to minimal pain or discomfort in the right lower quadrant, was ambulating without issues, had normal bowel function, and was deemed ready for discharge to home.  Consults: None  Disposition: Home, self-care  Discharge Instructions    Call MD for:  difficulty breathing, headache or visual disturbances    Complete by:  As directed    Call MD for:  persistant nausea and vomiting    Complete by:  As directed    Call MD for:  severe uncontrolled pain    Complete by:  As directed    Call MD for:  temperature >100.4    Complete by:  As directed    Diet - low sodium heart healthy    Complete by:  As directed    Driving Restrictions    Complete by:  As directed    Do not drive while taking narcotics for pain control   Increase activity slowly    Complete by:  As directed        Medication List    TAKE these medications   amoxicillin-clavulanate 875-125 MG tablet Commonly known as:  AUGMENTIN Take 1 tablet by mouth every 12 (twelve) hours.   HYDROcodone-acetaminophen 5-325 MG tablet Commonly known as:  NORCO/VICODIN Take 1-2 tablets by mouth every 4 (four) hours as needed for moderate pain.   pantoprazole 40 MG tablet Commonly known as:  PROTONIX Take 1 tablet (40 mg total) by mouth daily.    pseudoephedrine-acetaminophen 30-500 MG Tabs tablet Commonly known as:  TYLENOL SINUS Take 1 tablet by mouth.      Follow-up [place], MD Follow up in 3 week(s).   Specialty:  Surgery Why:  2-3 weeks Contact information: [address]  [address] [place] [address] [phone]

## 2016-08-20 NOTE — Care Management (Signed)
Patient to discharge on Augmentin, Protonix, and Vicodin, out of pocket cost with coupons provided from [facility] $14.76, $11.50, $8.78.  Patient discharged prior to providing application to [facility] and medication management application

## 2016-08-20 NOTE — Progress Notes (Signed)
08/20/2016  Subjective: No acute events overnight. Patient has tolerated her regular diet with no complications. Antibiotics had been transitioned to oral yesterday and there were no issues with that. Denies any nausea. Patient has been ambulating well.  Vital signs: Temp:  [98.3 F (36.8 C)-98.8 F (37.1 C)] 98.8 F (37.1 C) (11/30 0503) Pulse Rate:  [86-90] 86 (11/30 0503) Resp:  [18-20] 20 (11/30 0503) BP: (101-120)/(68-81) 120/81 (11/30 0503) SpO2:  [95 %-99 %] 99 % (11/30 0503)   Intake/Output: 11/29 0701 - 11/30 0700 In: 480 [P.O.:480] Out: 700 [Urine:700] Last BM Date: 08/18/16  Physical Exam: Constitutional: No acute distress Abdomen: Soft, nondistended, nontender to palpation.  Labs:   Recent Labs  08/19/16 0450 08/20/16 0601  WBC 8.9 7.3  HGB 10.5* 10.9*  HCT 31.8* 31.6*  PLT 279 308   No results for input(s): NA, K, CL, CO2, GLUCOSE, BUN, CREATININE, CALCIUM in the last 72 hours.  Invalid input(s): MAGNESIUM No results for input(s): LABPROT, INR in the last 72 hours.  Imaging: No results found.  Assessment/Plan: 55 year old female with rupture appendicitis with abscess.  -Patient will be discharged to home today. She will have an antibiotic course for a total 10 days.   Melvyn Neth, [place]

## 2016-09-07 ENCOUNTER — Encounter: Payer: Self-pay | Admitting: Surgery

## 2016-09-07 ENCOUNTER — Ambulatory Visit (INDEPENDENT_AMBULATORY_CARE_PROVIDER_SITE_OTHER): Payer: Self-pay | Admitting: Surgery

## 2016-09-07 VITALS — BP 149/105 | HR 94 | Temp 99.2°F | Ht 66.0 in | Wt 148.0 lb

## 2016-09-07 DIAGNOSIS — K3533 Acute appendicitis with perforation and localized peritonitis, with abscess: Secondary | ICD-10-CM

## 2016-09-07 DIAGNOSIS — K353 Acute appendicitis with localized peritonitis: Secondary | ICD-10-CM

## 2016-09-07 NOTE — Patient Instructions (Addendum)
We have your surgery scheduled for the week of  February 5th tentatively. Please call our office once you see your schedule. Please see your follow up appointment listed below. Please see the blue pre-care sheet for surgery information.  Laparoscopic Appendectomy, Adult, Care After Refer to this sheet in the next few weeks. These instructions provide you with information about caring for yourself after your procedure. Your health care provider may also give you more specific instructions. Your treatment has been planned according to current medical practices, but problems sometimes occur. Call your health care provider if you have any problems or questions after your procedure. What can I expect after the procedure? After the procedure, it is common to have:  A decrease in your energy level.  Mild pain in the area where the surgical cuts (incisions) were made.  Constipation. This can be caused by pain medicine and a decrease in your activity. Follow these instructions at home: Medicines  Take over-the-counter and prescription medicines only as told by your health care provider.  Do not drive for 24 hours if you received a sedative.  Do not drive or operate heavy machinery while taking prescription pain medicine.  If you were prescribed an antibiotic medicine, take it as told by your health care provider. Do not stop taking the antibiotic even if you start to feel better. Activity  For 3 weeks or as long as told by your health care provider:  Do not lift anything that is heavier than 10 pounds (4.5 kg).  Do not play contact sports.  Gradually return to your normal activities. Ask your health care provider what activities are safe for you. Bathing  Keep your incisions clean and dry. Clean them as often as told by your health care provider:  Gently wash the incisions with soap and water.  Rinse the incisions with water to remove all soap.  Pat the incisions dry with a clean towel.  Do not rub the incisions.  You may take showers after 48 hours.  Do not take baths, swim, or use hot tubs for 2 weeks or as told by your health care provider. Incision care  Follow instructions from your healthcare provider about how to take care of your incisions. Make sure you:  Wash your hands with soap and water before you change your bandage (dressing). If soap and water are not available, use hand sanitizer.  Change your dressing as told by your health care provider.  Leave stitches (sutures), skin glue, or adhesive strips in place. These skin closures may need to stay in place for 2 weeks or longer. If adhesive strip edges start to loosen and curl up, you may trim the loose edges. Do not remove adhesive strips completely unless your health care provider tells you to do that.  Check your incision areas every day for signs of infection. Check for:  More redness, swelling, or pain.  More fluid or blood.  Warmth.  Pus or a bad smell. Other Instructions  If you were sent home with a drain, follow instructions from your health care provider about how to care for the drain and how to empty it.  Take deep breaths. This helps to prevent your lungs from becoming inflamed.  To relieve and prevent constipation:  Drink plenty of fluids.  Eat plenty of fruits and vegetables.  Keep all follow-up visits as told by your health care provider. This is important. Contact a health care provider if:  You have more redness, swelling, or pain  around an incision.  You have more fluid or blood coming from an incision.  Your incision feels warm to the touch.  You have pus or a bad smell coming from an incision or dressing.  Your incision edges break open after your sutures have been removed.  You have increasing pain in your shoulders.  You feel dizzy or you faint.  You develop shortness of breath.  You keep feeling nauseous or vomiting.  You have diarrhea or you cannot control  your bowel functions.  You lose your appetite.  You develop swelling or pain in your legs. Get help right away if:  You have a fever.  You develop a rash.  You have difficulty breathing.  You have sharp pains in your chest. This information is not intended to replace advice given to you by your health care provider. Make sure you discuss any questions you have with your health care provider. Document Released: 09/07/2005 Document Revised: 02/07/2016 Document Reviewed: 02/25/2015 Elsevier Interactive Patient Education  2017 [employer].

## 2016-09-07 NOTE — Progress Notes (Signed)
09/07/2016  HPI: Patient had been admitted to the hospital on level/26 with acute appendicitis with possible abscess versus phlegmon at the tip of the appendix. She was treated conservatively with antibiotics was discharged to home on 11/30. She presents today for her first follow-up appointment. She has finished her antibiotic course and reports that she's been doing well with no worsening pain on the right lower quadrant. Denies having any fevers, chills, chest pain, shortness of breath. Reports some occasional constipation.  Vital signs: BP (!) 149/105   Pulse 94   Temp 99.2 F (37.3 C) (Oral)   Ht 5\' 6"  (1.676 m)   Wt 67.1 kg (148 lb)   BMI 23.89 kg/m    Physical Exam: Constitutional: No acute distress Abdomen:  Soft, nondistended, nontender to palpation.  Assessment/Plan: 55 year old female with the appendiceal abscess treated conservatively with antibiotics.  -At this point the patient continues to do well clinically and there is no indication at this time for any laboratory studies or imaging studies. I reassured the patient that if she continues to do well we can discuss doing an interval appendectomy in the next 6 weeks. In the meantime we will book her tentatively for February and she will come back to see [place] at the end of January for preoperative evaluation for surgery. In the meantime the patient does understand that if she is to get any worsening symptoms, fevers, chills, or worsening pain on the right lower quadrant that she is to call us right away so that she can be reevaluated.   Melvyn Neth, [place]

## 2016-09-10 ENCOUNTER — Ambulatory Visit: Payer: Self-pay | Admitting: Surgery

## 2016-10-15 ENCOUNTER — Encounter: Payer: Self-pay | Admitting: Surgery

## 2016-10-15 ENCOUNTER — Ambulatory Visit (INDEPENDENT_AMBULATORY_CARE_PROVIDER_SITE_OTHER): Payer: Self-pay | Admitting: Surgery

## 2016-10-15 VITALS — BP 101/70 | HR 114 | Temp 98.6°F | Ht 66.0 in | Wt 150.4 lb

## 2016-10-15 DIAGNOSIS — K3533 Acute appendicitis with perforation and localized peritonitis, with abscess: Secondary | ICD-10-CM

## 2016-10-15 DIAGNOSIS — K353 Acute appendicitis with localized peritonitis: Secondary | ICD-10-CM

## 2016-10-15 NOTE — Patient Instructions (Addendum)
You have requested to have your Appendix removed. We will arrange for this to be done on 10/27/16 at [facility] with Dr. Hampton Abbot.  You will most likely be out of work 1-2 weeks for this surgery. You will return after your post-op appointment with a lifting restriction for approximately 4 more weeks. If you have FMLA or Disability paperwork for your job please have this faxed to our office or bring this in prior to surgery. This will be filled out within 3 days of your surgery being completed. Our fax number is [phone].  You will be able to eat anything you would like to following surgery. But, start by eating a bland diet and advance this as tolerated.   Laparoscopic Appendectomy, Adult A laparoscopic appendectomy is a surgery to take out the appendix. The appendix is a finger-like structure that is attached to the large intestine. In this surgery, the appendix is removed through two or three small cuts (incisions) with the help of a thin, lighted tube that has a tiny camera on the end (laparoscope). A laparoscopic appendectomy may be done to prevent an inflamed appendix from bursting (rupturing). Or, it may be done to treat the infection from an appendix that has already ruptured. It is usually done immediately after inflammation of the appendix (appendicitis) is diagnosed. Tell a health care provider about:  Any allergies you have.  All medicines you are taking, including vitamins, herbs, eye drops, creams, and over-the-counter medicines.  Any problems you or family members have had with anesthetic medicines.  Any blood disorders you have.  Any surgeries you have had.  Any medical conditions you have.  Whether you are pregnant or may be pregnant. What are the risks? Generally, this is a safe procedure. However, problems may occur, including:  Infection.  Bleeding.  Allergic reactions to medicines.  Damage to other structures or organs.  The formation of collections of  pus (abscesses).  Long-lasting pain or scarring at the incision sites or inside the abdomen.  Blood clots in the legs. What happens before the procedure?  Follow instructions from your health care provider about eating or drinking restrictions.  Ask your health care provider about:  Changing or stopping your regular medicines. This is especially important if you are taking diabetes medicines or blood thinners.  Taking medicines such as aspirin and ibuprofen. These medicines can thin your blood. Do not take these medicines before your procedure if your health care provider instructs you not to.  Ask your health care provider how your surgical site will be marked or identified.  You may be given antibiotic medicine to help prevent infection or to treat existing inflammation or infection.  If you will be going home on the same day as your surgery, plan to have someone with you for 24 hours. What happens during the procedure?  To reduce your risk of infection:  Your health care team will wash or sanitize their hands.  Your skin will be washed with soap.  An IV tube will be inserted into one of your veins. You will receive medicine and fluids through this tube.  You will be given one or more of the following:  A medicine to help you relax (sedative).  A medicine to numb the area (local anesthetic).  A medicine to make you fall asleep (general anesthetic).  A medicine that is injected into your spine to numb the area below and slightly above the injection site (spinal anesthetic).  A medicine that is injected into  an area of your body to numb everything below the injection site (regional anesthetic).  A thin, flexible tube (catheter) may be put into your bladder to drain urine.  A tube may be passed through your nose and into your stomach (NG tube, or nasogastric tube) to drain any stomach contents.  Your surgeon will make two or three small incisions near your belly button  (navel).  Air-like gas will be used to fill your abdomen. The gas will make your abdomen expand. This helps the surgeon to see clearly and gives him or her more room to work.  A laparoscope will be passed through one of the incisions.  Other long, thin surgical instruments will be passed through the other incisions.  The appendix will be located and removed through one of the incisions.  The abdomen may be washed out to remove bacteria.  The incisions will be closed with stitches (sutures), staples, or adhesive strips.  A bandage (dressing) may be used to cover the incisions.  If a tube was inserted into your bladder or stomach, it will be removed. What happens after the procedure?  Your blood pressure, heart rate, breathing rate, and blood oxygen level will be monitored often until the medicines you were given have worn off.  You will be given pain medicine as needed to keep you comfortable.  If your appendix did not rupture, you may be able to go home the same day as your surgery.  Do not drive for 24 hours if you received a sedative.  If your appendix ruptured:  You will get antibiotic medicine through an IV tube.  You may be sent home with a temporary drain. This information is not intended to replace advice given to you by your health care provider. Make sure you discuss any questions you have with your health care provider. Document Released: 04/21/2004 Document Revised: 02/13/2016 Document Reviewed: 02/25/2015 Elsevier Interactive Patient Education  2017 Elsevier Inc.   Laparoscopic Appendectomy, Adult, Care After Refer to this sheet in the next few weeks. These instructions provide you with information about caring for yourself after your procedure. Your health care provider may also give you more specific instructions. Your treatment has been planned according to current medical practices, but problems sometimes occur. Call your health care provider if you have any  problems or questions after your procedure. What can I expect after the procedure? After the procedure, it is common to have:  A decrease in your energy level.  Mild pain in the area where the surgical cuts (incisions) were made.  Constipation. This can be caused by pain medicine and a decrease in your activity. Follow these instructions at home: Medicines  Take over-the-counter and prescription medicines only as told by your health care provider.  Do not drive for 24 hours if you received a sedative.  Do not drive or operate heavy machinery while taking prescription pain medicine.  If you were prescribed an antibiotic medicine, take it as told by your health care provider. Do not stop taking the antibiotic even if you start to feel better. Activity  For 3 weeks or as long as told by your health care provider:  Do not lift anything that is heavier than 10 pounds (4.5 kg).  Do not play contact sports.  Gradually return to your normal activities. Ask your health care provider what activities are safe for you. Bathing  Keep your incisions clean and dry. Clean them as often as told by your health care provider:  Gently wash the incisions with soap and water.  Rinse the incisions with water to remove all soap.  Pat the incisions dry with a clean towel. Do not rub the incisions.  You may take showers after 48 hours.  Do not take baths, swim, or use hot tubs for 2 weeks or as told by your health care provider. Incision care  Follow instructions from your healthcare provider about how to take care of your incisions. Make sure you:  Wash your hands with soap and water before you change your bandage (dressing). If soap and water are not available, use hand sanitizer.  Change your dressing as told by your health care provider.  Leave stitches (sutures), skin glue, or adhesive strips in place. These skin closures may need to stay in place for 2 weeks or longer. If adhesive strip  edges start to loosen and curl up, you may trim the loose edges. Do not remove adhesive strips completely unless your health care provider tells you to do that.  Check your incision areas every day for signs of infection. Check for:  More redness, swelling, or pain.  More fluid or blood.  Warmth.  Pus or a bad smell. Other Instructions  If you were sent home with a drain, follow instructions from your health care provider about how to care for the drain and how to empty it.  Take deep breaths. This helps to prevent your lungs from becoming inflamed.  To relieve and prevent constipation:  Drink plenty of fluids.  Eat plenty of fruits and vegetables.  Keep all follow-up visits as told by your health care provider. This is important. Contact a health care provider if:  You have more redness, swelling, or pain around an incision.  You have more fluid or blood coming from an incision.  Your incision feels warm to the touch.  You have pus or a bad smell coming from an incision or dressing.  Your incision edges break open after your sutures have been removed.  You have increasing pain in your shoulders.  You feel dizzy or you faint.  You develop shortness of breath.  You keep feeling nauseous or vomiting.  You have diarrhea or you cannot control your bowel functions.  You lose your appetite.  You develop swelling or pain in your legs. Get help right away if:  You have a fever.  You develop a rash.  You have difficulty breathing.  You have sharp pains in your chest. This information is not intended to replace advice given to you by your health care provider. Make sure you discuss any questions you have with your health care provider. Document Released: 09/07/2005 Document Revised: 02/07/2016 Document Reviewed: 02/25/2015 Elsevier Interactive Patient Education  2017 [employer].

## 2016-10-15 NOTE — Progress Notes (Signed)
10/15/2016  History of Present Illness: Frances Davidson is a 56 y.o. female who presents with a history of acute appendicitis with possible abscess vs phlegmon on 11/26.  She was discharged on 11/30 after conservative management.  She presents today for discussion and scheduling for surgery.    She reports that she has not had any abdominal pain since discharge and has been having a regular diet without nausea or vomiting or pain.  Denies having any fevers or chills, chest pain or shortness of breath.  She is still interested in having surgery to remove the appendix so this does not happen again.  Past Medical History: Past Medical History:  Diagnosis Date  . 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 BREAST BIOPSY Left    1976 -  . BREAST BIOPSY Left   . TUBAL LIGATION  1985    Home Medications: Prior to Admission medications   Medication Sig Start Date End Date Taking? Authorizing Provider  pantoprazole (PROTONIX) 40 MG tablet Take 1 tablet (40 mg total) by mouth daily. 08/20/16  Yes Olean Ree, MD  pseudoephedrine-acetaminophen (TYLENOL SINUS) 30-500 MG TABS tablet Take 2 tablets by mouth daily as needed (sinus headache).    Yes Historical Provider, MD    Allergies: No Known Allergies  Social History:  reports that she has been smoking Cigarettes.  She has never used smokeless tobacco. She reports that she drinks alcohol. She reports that she does not use drugs.   Family History: Family History  Problem Relation Age of Onset  . Diabetes Mother   . Lung cancer Father   . Hypertension Father     Review of Systems: Review of Systems  Constitutional: Negative for chills and fever.  HENT: Negative for hearing loss.   Eyes: Negative for blurred vision.  Respiratory: Negative for cough and shortness of breath.   Cardiovascular: Negative for chest pain and leg swelling.  Gastrointestinal: Negative for  abdominal pain, constipation, diarrhea, heartburn, nausea and vomiting.  Genitourinary: Negative for dysuria.  Musculoskeletal: Negative for myalgias.  Skin: Negative for rash.  Neurological: Negative for dizziness.  Psychiatric/Behavioral: Negative for depression.  All other systems reviewed and are negative.   Physical Exam BP 101/70   Pulse (!) 114   Temp 98.6 F (37 C) (Oral)   Ht 5\' 6"  (1.676 m)   Wt 68.2 kg (150 lb 6.4 oz)   BMI 24.28 kg/m  CONSTITUTIONAL: No acute distress HEENT:  Normocephalic, atraumatic, extraocular motion intact. NECK: Trachea is midline, and there is no jugular venous distension.  RESPIRATORY:  Lungs are clear, and breath sounds are equal bilaterally. Normal respiratory effort without pathologic use of accessory muscles. CARDIOVASCULAR: Heart is regular without murmurs, gallops, or rubs. GI: The abdomen is soft, non-distended, non-tender to palpation. There were no palpable masses.  MUSCULOSKELETAL:  Normal muscle strength and tone in all four extremities.  No peripheral edema or cyanosis. SKIN: Skin turgor is normal. There are no pathologic skin lesions.  NEUROLOGIC:  Motor and sensation is grossly normal.  Cranial nerves are grossly intact. PSYCH:  Alert and oriented to person, place and time. Affect is normal.   Assessment and Plan: This is a 56 y.o. female who presents with a history of acute appendicitis with abscess vs phlegmon, treated conservatively.  --Had a lengthy discussion with the patient regarding the options of continued watchful waiting vs surgical management.  She is anxious about this potentially happening again  despite the low risk and she would like to undergo an interval appendectomy. --The risks and benefits of a laparoscopic appendectomy were discussed with the patient, including risk of bleeding, infection, injury to surrounding structures, and the need to convert to an open procedure.  She understands these risks and has given  informed consent. --She will be scheduled for 10/27/16 for a laparoscopic appendectomy.  All of her questions have been answered.   Melvyn Neth, [place]

## 2016-10-16 ENCOUNTER — Telehealth: Payer: Self-pay | Admitting: Surgery

## 2016-10-16 NOTE — Telephone Encounter (Signed)
Pt advised of pre op date/time and sx date. Sx: 10/27/16 with Dr Audry Riles appendectomy.  Pre op: 10/20/16 @ 1:00pm--office.   Patient made aware to call [phone], between 1-3:00pm the day before surgery, to find out what time to arrive.    Patient has been advised of the physician estimate for CPT: [phone]. Amount: 570.00  Patient was advised of the deposit of 250.00. Patient will call back to advise if she can make this payment.

## 2016-10-20 ENCOUNTER — Inpatient Hospital Stay: Admission: RE | Admit: 2016-10-20 | Payer: Self-pay | Source: Ambulatory Visit

## 2016-10-21 ENCOUNTER — Encounter
Admission: RE | Admit: 2016-10-21 | Discharge: 2016-10-21 | Disposition: A | Discharge status: Home / Self Care | Payer: Self-pay | Source: Ambulatory Visit | Attending: Surgery | Admitting: Surgery

## 2016-10-21 DIAGNOSIS — Z01818 Encounter for other preprocedural examination: Secondary | ICD-10-CM | POA: Insufficient documentation

## 2016-10-21 DIAGNOSIS — K353 Acute appendicitis with localized peritonitis: Secondary | ICD-10-CM | POA: Insufficient documentation

## 2016-10-21 HISTORY — DX: Anemia, unspecified: D64.9

## 2016-10-21 HISTORY — DX: Gastro-esophageal reflux disease without esophagitis: K21.9

## 2016-10-21 LAB — CBC
HCT: 36.2 % (ref 35.0–47.0)
Hemoglobin: 12 g/dL (ref 12.0–16.0)
MCH: 30.3 pg (ref 26.0–34.0)
MCHC: 33.2 g/dL (ref 32.0–36.0)
MCV: 91.3 fL (ref 80.0–100.0)
PLATELETS: 210 10*3/uL (ref 150–440)
RBC: 3.97 MIL/uL (ref 3.80–5.20)
RDW: 15.1 % — ABNORMAL HIGH (ref 11.5–14.5)
WBC: 4.7 10*3/uL (ref 3.6–11.0)

## 2016-10-21 NOTE — Patient Instructions (Signed)
Your procedure is scheduled on: October 27, 2016 TUESDAY Su procedimiento est programado para: Report to [facility]. Presntese a: To find out your arrival time please call [phone] between 1PM - 3PM on Monday October 26, 2016 Para saber su hora de llegada por favor llame al [phone] entre la 1PM - 3PM el da:  Remember: Instructions that are not followed completely may result in serious medical risk, up to and including death, or upon the discretion of your surgeon and anesthesiologist your surgery may need to be rescheduled.  Recuerde: Las instrucciones que no se siguen completamente Heritage manager en un riesgo de salud grave, incluyendo hasta la [place] o a discrecin de su cirujano y Environmental health practitioner, su ciruga se puede posponer.   __X__ 1. Do not eat food or drink liquids after midnight. No gum chewing or hard candies.  No coma alimentos ni tome lquidos despus de la medianoche.  No mastique chicle ni caramelos  duros.     __X__ 2. No alcohol for 24 hours before or after surgery.    No tome alcohol durante las 24 horas antes ni despus de la [place].   ____ 3. Bring all medications with you on the day of surgery if instructed.    Lleve todos los medicamentos con usted el da de su ciruga si se le ha indicado as.   __X__ 4. Notify your doctor if there is any change in your medical condition (cold, fever,                             infections).    Informe a su mdico si hay algn cambio en su condicin mdica (resfriado, fiebre, infecciones).   Do not wear jewelry, make-up, hairpins, clips or nail polish.  No use joyas, maquillajes, pinzas/ganchos para el cabello ni esmalte de uas.  Do not wear lotions, powders, or perfumes. You may wear deodorant.  No use lociones, polvos o perfumes.  Puede usar desodorante.    Do not shave 48 hours prior to surgery. Men may shave face and neck.  No se afeite 48 horas antes de la [place].  Los  hombres pueden [employer] cara y el cuello.   Do not bring valuables to the hospital.   No lleve objetos [place] is not responsible for any belongings or valuables.  [place] no se hace responsable de ningn tipo de pertenencias u objetos de Geographical information systems officer.               Contacts, dentures or bridgework may not be worn into surgery.  Los lentes de [place], las dentaduras postizas o puentes no se pueden usar en la [place].  Leave your suitcase in the car. After surgery it may be brought to your room.  Deje su maleta en el auto.  Despus de la ciruga podr traerla a su habitacin.  For patients admitted to the hospital, discharge time is determined by your treatment team.  Para los pacientes que sean ingresados al hospital, el tiempo en el cual se le dar de alta es determinado por su                equipo de [place].   Patients discharged the day of surgery will not be allowed to drive home. A los pacientes que se les da de alta el mismo da de la ciruga no se les permitir conducir a Holiday representative.  Please read over the following fact sheets that you were given: Por favor [place] informacin que le dieron:   CHG INSTRUCTIONS    __X__ Take these medicines the morning of surgery with A SIP OF WATER:          [employer] medicinas la maana de la ciruga con UN SORBO DE AGUA:  1. TAKE PROTONIX NIGHT BEFORE AND MORNING OF SURGERY  2.   3.   4.       5.  6.  ____ Fleet Enema (as directed)          Enema de Fleet (segn lo indicado)    __X_ Use CHG Soap as directed          Utilice el jabn de CHG segn lo indicado  ____ Use inhalers on the day of surgery          Use los inhaladores el da de la ciruga  ____ Stop metformin 2 days prior to surgery          Deje de tomar el metformin 2 das antes de la ciruga    ____ Take 1/2 of usual insulin dose the night before surgery and none on the morning of surgery           Tome la mitad de la  dosis habitual de insulina la noche antes de la [place] y no tome nada en la maana de la             ciruga  ____ Stop Coumadin/Plavix/aspirin on           Deje de tomar el Coumadin/Plavix/aspirina el da:  __X__ Stop Anti-inflammatories on MOTRIN, ADVIL, ALEVE, IBUPROFEN USE TYLENOL ONLY          Deje de tomar antiinflamatorios el da:   ____ Stop supplements until after surgery            Deje de tomar suplementos hasta despus de la ciruga  ____ Bring C-Pap to the hospital          [place] al hospital

## 2016-10-26 MED ORDER — CEFOXITIN SODIUM-DEXTROSE 2-2.2 GM-% IV SOLR (PREMIX)
2.0000 g | INTRAVENOUS | Status: AC
  Administered 2016-10-27: 2000 mg via INTRAVENOUS

## 2016-10-27 ENCOUNTER — Ambulatory Visit: Payer: Self-pay | Admitting: Anesthesiology

## 2016-10-27 ENCOUNTER — Encounter: Payer: Self-pay | Admitting: *Deleted

## 2016-10-27 ENCOUNTER — Ambulatory Visit
Admission: RE | Admit: 2016-10-27 | Discharge: 2016-10-27 | Disposition: A | Discharge status: Home / Self Care | Payer: Self-pay | Source: Ambulatory Visit | Attending: Surgery | Admitting: Surgery

## 2016-10-27 ENCOUNTER — Encounter
Admission: RE | Disposition: A | Discharge status: Home / Self Care | Payer: Self-pay | Source: Ambulatory Visit | Attending: Surgery

## 2016-10-27 DIAGNOSIS — K219 Gastro-esophageal reflux disease without esophagitis: Secondary | ICD-10-CM | POA: Insufficient documentation

## 2016-10-27 DIAGNOSIS — K353 Acute appendicitis with localized peritonitis: Secondary | ICD-10-CM | POA: Insufficient documentation

## 2016-10-27 DIAGNOSIS — F17210 Nicotine dependence, cigarettes, uncomplicated: Secondary | ICD-10-CM | POA: Insufficient documentation

## 2016-10-27 DIAGNOSIS — Z8719 Personal history of other diseases of the digestive system: Secondary | ICD-10-CM

## 2016-10-27 HISTORY — PX: LAPAROSCOPIC APPENDECTOMY: SHX408

## 2016-10-27 SURGERY — APPENDECTOMY, LAPAROSCOPIC
Anesthesia: General

## 2016-10-27 MED ORDER — EPHEDRINE SULFATE 50 MG/ML IJ SOLN
INTRAMUSCULAR | Status: DC | PRN
  Administered 2016-10-27: 5 mg via INTRAVENOUS
  Administered 2016-10-27 (×3): 10 mg via INTRAVENOUS

## 2016-10-27 MED ORDER — BUPIVACAINE-EPINEPHRINE (PF) 0.5% -1:200000 IJ SOLN
INTRAMUSCULAR | Status: AC
  Filled 2016-10-27: qty 30

## 2016-10-27 MED ORDER — ONDANSETRON HCL 4 MG/2ML IJ SOLN
INTRAMUSCULAR | Status: AC
  Administered 2016-10-27: 4 mg via INTRAVENOUS
  Filled 2016-10-27: qty 2

## 2016-10-27 MED ORDER — HYDROMORPHONE HCL 1 MG/ML IJ SOLN
INTRAMUSCULAR | Status: AC
  Administered 2016-10-27: 0.5 mg via INTRAVENOUS
  Filled 2016-10-27: qty 1

## 2016-10-27 MED ORDER — LIDOCAINE HCL (CARDIAC) 20 MG/ML IV SOLN
INTRAVENOUS | Status: DC | PRN
  Administered 2016-10-27: 60 mg via INTRAVENOUS

## 2016-10-27 MED ORDER — HYDROMORPHONE HCL 1 MG/ML IJ SOLN
0.5000 mg | INTRAMUSCULAR | Status: DC | PRN
  Administered 2016-10-27 (×2): 0.5 mg via INTRAVENOUS

## 2016-10-27 MED ORDER — GABAPENTIN 300 MG PO CAPS
300.0000 mg | ORAL_CAPSULE | ORAL | Status: AC
  Administered 2016-10-27: 300 mg via ORAL

## 2016-10-27 MED ORDER — SODIUM CHLORIDE 0.9 % IJ SOLN
INTRAMUSCULAR | Status: AC
  Filled 2016-10-27: qty 10

## 2016-10-27 MED ORDER — ACETAMINOPHEN 500 MG PO TABS
ORAL_TABLET | ORAL | Status: AC
  Administered 2016-10-27: 1000 mg via ORAL
  Filled 2016-10-27: qty 2

## 2016-10-27 MED ORDER — DEXAMETHASONE SODIUM PHOSPHATE 10 MG/ML IJ SOLN
INTRAMUSCULAR | Status: AC
  Filled 2016-10-27: qty 1

## 2016-10-27 MED ORDER — KETOROLAC TROMETHAMINE 30 MG/ML IJ SOLN
30.0000 mg | Freq: Once | INTRAMUSCULAR | Status: AC
  Administered 2016-10-27: 30 mg via INTRAVENOUS

## 2016-10-27 MED ORDER — FENTANYL CITRATE (PF) 100 MCG/2ML IJ SOLN
25.0000 ug | INTRAMUSCULAR | Status: DC | PRN
  Administered 2016-10-27 (×5): 25 ug via INTRAVENOUS

## 2016-10-27 MED ORDER — IBUPROFEN 600 MG PO TABS: 600.0000 mg | ORAL_TABLET | Freq: Three times a day (TID) | ORAL | 0 refills | Status: DC | PRN

## 2016-10-27 MED ORDER — MIDAZOLAM HCL 2 MG/2ML IJ SOLN
INTRAMUSCULAR | Status: DC | PRN
  Administered 2016-10-27: 2 mg via INTRAVENOUS

## 2016-10-27 MED ORDER — PROPOFOL 10 MG/ML IV BOLUS
INTRAVENOUS | Status: DC | PRN
  Administered 2016-10-27: 140 mg via INTRAVENOUS

## 2016-10-27 MED ORDER — ONDANSETRON HCL 4 MG/2ML IJ SOLN
INTRAMUSCULAR | Status: DC | PRN
  Administered 2016-10-27: 4 mg via INTRAVENOUS

## 2016-10-27 MED ORDER — FENTANYL CITRATE (PF) 100 MCG/2ML IJ SOLN
INTRAMUSCULAR | Status: AC
  Filled 2016-10-27: qty 2

## 2016-10-27 MED ORDER — CHLORHEXIDINE GLUCONATE CLOTH 2 % EX PADS: 6.0000 | MEDICATED_PAD | Freq: Once | CUTANEOUS | Status: DC

## 2016-10-27 MED ORDER — ONDANSETRON HCL 4 MG/2ML IJ SOLN
INTRAMUSCULAR | Status: AC
  Filled 2016-10-27: qty 2

## 2016-10-27 MED ORDER — ROCURONIUM BROMIDE 100 MG/10ML IV SOLN
INTRAVENOUS | Status: DC | PRN
  Administered 2016-10-27: 10 mg via INTRAVENOUS
  Administered 2016-10-27: 30 mg via INTRAVENOUS

## 2016-10-27 MED ORDER — SUGAMMADEX SODIUM 200 MG/2ML IV SOLN
INTRAVENOUS | Status: DC | PRN
  Administered 2016-10-27: 140 mg via INTRAVENOUS

## 2016-10-27 MED ORDER — ROCURONIUM BROMIDE 50 MG/5ML IV SOLN
INTRAVENOUS | Status: AC
  Filled 2016-10-27: qty 1

## 2016-10-27 MED ORDER — OXYCODONE HCL 5 MG PO TABS: 5.0000 mg | ORAL_TABLET | Freq: Four times a day (QID) | ORAL | 0 refills | Status: DC | PRN

## 2016-10-27 MED ORDER — LIDOCAINE HCL (PF) 2 % IJ SOLN
INTRAMUSCULAR | Status: AC
Start: 2016-10-27 — End: 2016-10-27
  Filled 2016-10-27: qty 2

## 2016-10-27 MED ORDER — KETOROLAC TROMETHAMINE 30 MG/ML IJ SOLN
INTRAMUSCULAR | Status: AC
  Administered 2016-10-27: 30 mg via INTRAVENOUS
  Filled 2016-10-27: qty 1

## 2016-10-27 MED ORDER — EPHEDRINE SULFATE 50 MG/ML IJ SOLN
INTRAMUSCULAR | Status: AC
  Filled 2016-10-27: qty 1

## 2016-10-27 MED ORDER — CEFOXITIN SODIUM-DEXTROSE 2-2.2 GM-% IV SOLR (PREMIX)
INTRAVENOUS | Status: AC
  Administered 2016-10-27: 2000 mg via INTRAVENOUS
  Filled 2016-10-27: qty 50

## 2016-10-27 MED ORDER — PHENYLEPHRINE HCL 10 MG/ML IJ SOLN
INTRAMUSCULAR | Status: DC | PRN
  Administered 2016-10-27: 100 ug via INTRAVENOUS
  Administered 2016-10-27: 200 ug via INTRAVENOUS

## 2016-10-27 MED ORDER — FENTANYL CITRATE (PF) 100 MCG/2ML IJ SOLN
INTRAMUSCULAR | Status: DC | PRN
  Administered 2016-10-27 (×2): 50 ug via INTRAVENOUS

## 2016-10-27 MED ORDER — FENTANYL CITRATE (PF) 100 MCG/2ML IJ SOLN
INTRAMUSCULAR | Status: AC
  Administered 2016-10-27: 25 ug via INTRAVENOUS
  Filled 2016-10-27: qty 2

## 2016-10-27 MED ORDER — PROPOFOL 10 MG/ML IV BOLUS
INTRAVENOUS | Status: AC
Start: 2016-10-27 — End: 2016-10-27
  Filled 2016-10-27: qty 20

## 2016-10-27 MED ORDER — MIDAZOLAM HCL 2 MG/2ML IJ SOLN
INTRAMUSCULAR | Status: AC
  Filled 2016-10-27: qty 2

## 2016-10-27 MED ORDER — BUPIVACAINE-EPINEPHRINE (PF) 0.5% -1:200000 IJ SOLN
INTRAMUSCULAR | Status: DC | PRN
  Administered 2016-10-27: 20 mL

## 2016-10-27 MED ORDER — ACETAMINOPHEN 500 MG PO TABS
1000.0000 mg | ORAL_TABLET | ORAL | Status: AC
  Administered 2016-10-27: 1000 mg via ORAL

## 2016-10-27 MED ORDER — DEXAMETHASONE SODIUM PHOSPHATE 10 MG/ML IJ SOLN
INTRAMUSCULAR | Status: DC | PRN
  Administered 2016-10-27: 10 mg via INTRAVENOUS

## 2016-10-27 MED ORDER — LACTATED RINGERS IV SOLN
INTRAVENOUS | Status: DC
  Administered 2016-10-27 (×2): via INTRAVENOUS

## 2016-10-27 MED ORDER — GABAPENTIN 300 MG PO CAPS
ORAL_CAPSULE | ORAL | Status: AC
  Administered 2016-10-27: 300 mg via ORAL
  Filled 2016-10-27: qty 1

## 2016-10-27 MED ORDER — ONDANSETRON HCL 4 MG/2ML IJ SOLN
4.0000 mg | Freq: Once | INTRAMUSCULAR | Status: AC | PRN
  Administered 2016-10-27: 4 mg via INTRAVENOUS

## 2016-10-27 MED ORDER — SUGAMMADEX SODIUM 200 MG/2ML IV SOLN
INTRAVENOUS | Status: AC
  Filled 2016-10-27: qty 2

## 2016-10-27 SURGICAL SUPPLY — 38 items
CANISTER SUCT 1200ML W/VALVE (MISCELLANEOUS) ×3 IMPLANT
CHLORAPREP W/TINT 26ML (MISCELLANEOUS) ×3 IMPLANT
CUTTER FLEX LINEAR 45M (STAPLE) IMPLANT
DERMABOND ADVANCED (GAUZE/BANDAGES/DRESSINGS) ×2
DERMABOND ADVANCED .7 DNX12 (GAUZE/BANDAGES/DRESSINGS) ×1 IMPLANT
ELECT CAUTERY BLADE 6.4 (BLADE) ×3 IMPLANT
ELECT REM PT RETURN 9FT ADLT (ELECTROSURGICAL) ×3
ELECTRODE REM PT RTRN 9FT ADLT (ELECTROSURGICAL) ×1 IMPLANT
ENDOPOUCH RETRIEVER 10 (MISCELLANEOUS) IMPLANT
GLOVE SURG SYN 7.0 (GLOVE) ×3 IMPLANT
GLOVE SURG SYN 7.5  E (GLOVE) ×2
GLOVE SURG SYN 7.5 E (GLOVE) ×1 IMPLANT
GOWN STRL REUS W/ TWL LRG LVL3 (GOWN DISPOSABLE) ×2 IMPLANT
GOWN STRL REUS W/TWL LRG LVL3 (GOWN DISPOSABLE) ×4
IRRIGATION STRYKERFLOW (MISCELLANEOUS) IMPLANT
IRRIGATOR STRYKERFLOW (MISCELLANEOUS)
IV NS 1000ML (IV SOLUTION)
IV NS 1000ML BAXH (IV SOLUTION) IMPLANT
KIT RM TURNOVER STRD PROC AR (KITS) ×3 IMPLANT
LABEL OR SOLS (LABEL) ×3 IMPLANT
LIGASURE MARYLAND LAP STAND (ELECTROSURGICAL) ×3 IMPLANT
NEEDLE HYPO 22GX1.5 SAFETY (NEEDLE) ×3 IMPLANT
NS IRRIG 500ML POUR BTL (IV SOLUTION) ×3 IMPLANT
PACK LAP CHOLECYSTECTOMY (MISCELLANEOUS) ×3 IMPLANT
PENCIL ELECTRO HAND CTR (MISCELLANEOUS) ×3 IMPLANT
RELOAD 45 VASCULAR/THIN (ENDOMECHANICALS) IMPLANT
RELOAD STAPLE TA45 3.5 REG BLU (ENDOMECHANICALS) ×3 IMPLANT
SCISSORS METZENBAUM CVD 33 (INSTRUMENTS) IMPLANT
SLEEVE ADV FIXATION 5X100MM (TROCAR) ×6 IMPLANT
SUT MNCRL 4-0 (SUTURE) ×4
SUT MNCRL 4-0 27XMFL (SUTURE) ×2
SUT VICRYL 0 AB UR-6 (SUTURE) ×3 IMPLANT
SUTURE MNCRL 4-0 27XMF (SUTURE) ×2 IMPLANT
TRAY FOLEY W/METER SILVER 16FR (SET/KITS/TRAYS/PACK) ×3 IMPLANT
TROCAR 130MM GELPORT  DAV (MISCELLANEOUS) IMPLANT
TROCAR XCEL BLUNT TIP 100MML (ENDOMECHANICALS) ×3 IMPLANT
TROCAR Z-THREAD OPTICAL 5X100M (TROCAR) ×3 IMPLANT
TUBING INSUFFLATOR HI FLOW (MISCELLANEOUS) ×3 IMPLANT

## 2016-10-27 NOTE — Transfer of Care (Signed)
Immediate Anesthesia Transfer of Care Note  Patient: Frances Davidson  Procedure(s) Performed: Procedure(s): APPENDECTOMY LAPAROSCOPIC (N/A)  Patient Location: PACU  Anesthesia Type:General  Level of Consciousness: oriented, patient cooperative and lethargic  Airway & Oxygen Therapy: Patient Spontanous Breathing and Patient connected to face mask oxygen  Post-op Assessment: Report given to RN and Post -op Vital signs reviewed and stable  Post vital signs: Reviewed and stable  Last Vitals:  Vitals:   10/27/16 0903 10/27/16 1130  BP: 125/88 112/78  Pulse: 88 100  Resp: 18 13  Temp: (!) 36 C     Last Pain:  Vitals:   10/27/16 0903  TempSrc: Tympanic         Complications: No apparent anesthesia complications

## 2016-10-27 NOTE — Anesthesia Post-op Follow-up Note (Cosign Needed)
Anesthesia QCDR form completed.        

## 2016-10-27 NOTE — Op Note (Signed)
  Procedure Date:  10/27/2016  Pre-operative Diagnosis:  History of appendicitis with phlegmon  Post-operative Diagnosis: History of appendicitis with phlegmon  Procedure:  Laparoscopic appendectomy  Surgeon:  Melvyn Neth, MD  Anesthesia:  General endotracheal  Estimated Blood Loss:  10 ml  Specimens:  appendix  Complications:  None  Indications for Procedure:  This is a 56 y.o. female with a history of acute appendicitis with a phlegmon on 07/2016 which was treated conservatively.  She presents today for interval appendectomy. The options of surgery versus observation were reviewed with the patient and/or family. The risks of bleeding, infection, recurrence of symptoms, negative laparoscopy, potential for an open procedure, bowel injury, abscess or infection, were all discussed with the patient and she was willing to proceed.  Description of Procedure: The patient was correctly identified in the preoperative area and brought into the operating room.  The patient was placed supine with VTE prophylaxis in place.  Appropriate time-outs were performed.  Anesthesia was induced and the patient was intubated.  Foley catheter was placed.  Appropriate antibiotics were infused.  The abdomen was prepped and draped in a sterile fashion. An infraumbilical incision was made. A cutdown technique was used to enter the abdominal cavity without injury, and a Hasson trocar was inserted.  Pneumoperitoneum was obtained with appropriate opening pressures.  Two 5-mm ports were placed in the suprapubic and left lateral positions under direct visualization.  The right lower quadrant was inspected and the appendix was identified and found to be inflamed and surrounded by adhesions.  The appendix was carefully dissected. The base of the appendix was dissected out and divided with a standard load Endo GIA. The mesoappendix was divided using the LigaSure.  The appendix was placed in an Endocatch bag and brought out  through the umbilical incision.  The right lower quadrant was then inspected again revealing an intact staple line, no bleeding, and no bowel injury.  The area was thoroughly irrigated.  The 5 mm ports were removed under direct visualization and the Hasson trocar was removed.  The fascial opening was closed using 0 vicryl suture.  Local anesthetic was infused in all incisions and the incisions were closed with 4-0 Monocryl.  The wounds were cleaned and sealed with DermaBond.  Foley catheter was removed and the patient was emerged from anesthesia and extubated and brought to the recovery room for further management.  The patient tolerated the procedure well and all counts were correct at the end of the case.   Melvyn Neth, MD

## 2016-10-27 NOTE — Discharge Instructions (Signed)
AMBULATORY SURGERY  DISCHARGE INSTRUCTIONS   1) The drugs that you were given will stay in your system until tomorrow so for the next 24 hours you should not:  A) Drive an automobile B) Make any legal decisions C) Drink any alcoholic beverage   2) You may resume regular meals tomorrow.  Today it is better to start with liquids and gradually work up to solid foods.  You may eat anything you prefer, but it is better to start with liquids, then soup and crackers, and gradually work up to solid foods.   3) Please notify your doctor immediately if you have any unusual bleeding, trouble breathing, redness and pain at the surgery site, drainage, fever, or pain not relieved by medication.    4) Additional Instructions:  Hospital main number  [id number]

## 2016-10-27 NOTE — Interval H&P Note (Signed)
History and Physical Interval Note:  10/27/2016 9:34 AM  Frances Davidson  has presented today for surgery, with the diagnosis of appendiceal abscess  The various methods of treatment have been discussed with the patient and family. After consideration of risks, benefits and other options for treatment, the patient has consented to  Procedure(s): APPENDECTOMY LAPAROSCOPIC (N/A) as a surgical intervention .  The patient's history has been reviewed, patient examined, no change in status, stable for surgery.  I have reviewed the patient's chart and labs.  Questions were answered to the patient's satisfaction.     Clif Serio

## 2016-10-27 NOTE — H&P (View-Only) (Signed)
10/15/2016  History of Present Illness: Frances Davidson is a 56 y.o. female who presents with a history of acute appendicitis with possible abscess vs phlegmon on 11/26.  She was discharged on 11/30 after conservative management.  She presents today for discussion and scheduling for surgery.    She reports that she has not had any abdominal pain since discharge and has been having a regular diet without nausea or vomiting or pain.  Denies having any fevers or chills, chest pain or shortness of breath.  She is still interested in having surgery to remove the appendix so this does not happen again.  Past Medical History: Past Medical History:  Diagnosis Date  . 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 BREAST BIOPSY Left    1976 -  . BREAST BIOPSY Left   . TUBAL LIGATION  1985    Home Medications: Prior to Admission medications   Medication Sig Start Date End Date Taking? Authorizing Provider  pantoprazole (PROTONIX) 40 MG tablet Take 1 tablet (40 mg total) by mouth daily. 08/20/16  Yes Olean Ree, MD  pseudoephedrine-acetaminophen (TYLENOL SINUS) 30-500 MG TABS tablet Take 2 tablets by mouth daily as needed (sinus headache).    Yes Historical Provider, MD    Allergies: No Known Allergies  Social History:  reports that she has been smoking Cigarettes.  She has never used smokeless tobacco. She reports that she drinks alcohol. She reports that she does not use drugs.   Family History: Family History  Problem Relation Age of Onset  . Diabetes Mother   . Lung cancer Father   . Hypertension Father     Review of Systems: Review of Systems  Constitutional: Negative for chills and fever.  HENT: Negative for hearing loss.   Eyes: Negative for blurred vision.  Respiratory: Negative for cough and shortness of breath.   Cardiovascular: Negative for chest pain and leg swelling.  Gastrointestinal: Negative for  abdominal pain, constipation, diarrhea, heartburn, nausea and vomiting.  Genitourinary: Negative for dysuria.  Musculoskeletal: Negative for myalgias.  Skin: Negative for rash.  Neurological: Negative for dizziness.  Psychiatric/Behavioral: Negative for depression.  All other systems reviewed and are negative.   Physical Exam BP 101/70   Pulse (!) 114   Temp 98.6 F (37 C) (Oral)   Ht 5\' 6"  (1.676 m)   Wt 68.2 kg (150 lb 6.4 oz)   BMI 24.28 kg/m  CONSTITUTIONAL: No acute distress HEENT:  Normocephalic, atraumatic, extraocular motion intact. NECK: Trachea is midline, and there is no jugular venous distension.  RESPIRATORY:  Lungs are clear, and breath sounds are equal bilaterally. Normal respiratory effort without pathologic use of accessory muscles. CARDIOVASCULAR: Heart is regular without murmurs, gallops, or rubs. GI: The abdomen is soft, non-distended, non-tender to palpation. There were no palpable masses.  MUSCULOSKELETAL:  Normal muscle strength and tone in all four extremities.  No peripheral edema or cyanosis. SKIN: Skin turgor is normal. There are no pathologic skin lesions.  NEUROLOGIC:  Motor and sensation is grossly normal.  Cranial nerves are grossly intact. PSYCH:  Alert and oriented to person, place and time. Affect is normal.   Assessment and Plan: This is a 56 y.o. female who presents with a history of acute appendicitis with abscess vs phlegmon, treated conservatively.  --Had a lengthy discussion with the patient regarding the options of continued watchful waiting vs surgical management.  She is anxious about this potentially happening again  despite the low risk and she would like to undergo an interval appendectomy. --The risks and benefits of a laparoscopic appendectomy were discussed with the patient, including risk of bleeding, infection, injury to surrounding structures, and the need to convert to an open procedure.  She understands these risks and has given  informed consent. --She will be scheduled for 10/27/16 for a laparoscopic appendectomy.  All of her questions have been answered.   Melvyn Neth, [place]

## 2016-10-27 NOTE — Anesthesia Procedure Notes (Signed)
Procedure Name: Intubation Date/Time: 10/27/2016 9:59 AM Performed by: Silvana Newness Pre-anesthesia Checklist: Patient identified, Emergency Drugs available, Suction available, Patient being monitored and Timeout performed Patient Re-evaluated:Patient Re-evaluated prior to inductionOxygen Delivery Method: Circle system utilized Preoxygenation: Pre-oxygenation with 100% oxygen Intubation Type: IV induction Ventilation: Mask ventilation without difficulty Laryngoscope Size: Mac and 3 Grade View: Grade I Tube type: Oral Tube size: 7.0 mm Number of attempts: 1 Airway Equipment and Method: Stylet Placement Confirmation: ETT inserted through vocal cords under direct vision,  positive ETCO2 and breath sounds checked- equal and bilateral Secured at: 18 cm Tube secured with: Tape Dental Injury: Teeth and Oropharynx as per pre-operative assessment

## 2016-10-27 NOTE — Anesthesia Preprocedure Evaluation (Signed)
Anesthesia Evaluation  Patient identified by MRN, date of birth, ID band Patient awake    Reviewed: Allergy & Precautions, H&P , NPO status , Patient's Chart, lab work & pertinent test results, reviewed documented beta blocker date and time   Airway Mallampati: II  TM Distance: >3 FB Neck ROM: full    Dental  (+) Teeth Intact   Pulmonary neg pulmonary ROS, Current Smoker,    Pulmonary exam normal        Cardiovascular negative cardio ROS Normal cardiovascular exam Rhythm:regular Rate:Normal     Neuro/Psych negative neurological ROS  negative psych ROS   GI/Hepatic negative GI ROS, Neg liver ROS, GERD  Medicated,  Endo/Other  negative endocrine ROS  Renal/GU negative Renal ROS  negative genitourinary   Musculoskeletal   Abdominal   Peds  Hematology negative hematology ROS (+) anemia ,   Anesthesia Other Findings Past Medical History: No date: Anemia No date: GERD (gastroesophageal reflux disease) 07/2016: Ruptured appendix Past Surgical History: No date: BREAST BIOPSY Left     Comment: 1976 - No date: BREAST BIOPSY Left 1985: TUBAL LIGATION BMI    Body Mass Index:  24.21 kg/m     Reproductive/Obstetrics negative OB ROS                             Anesthesia Physical Anesthesia Plan  ASA: II  Anesthesia Plan: General ETT   Post-op Pain Management:    Induction:   Airway Management Planned:   Additional Equipment:   Intra-op Plan:   Post-operative Plan:   Informed Consent: I have reviewed the patients History and Physical, chart, labs and discussed the procedure including the risks, benefits and alternatives for the proposed anesthesia with the patient or authorized representative who has indicated his/her understanding and acceptance.   Dental Advisory Given  Plan Discussed with: CRNA  Anesthesia Plan Comments:         Anesthesia Quick Evaluation

## 2016-10-28 ENCOUNTER — Telehealth: Payer: Self-pay

## 2016-10-28 NOTE — Telephone Encounter (Signed)
Patient came in today and paid $250.00 towards her surgery. She would like for you to call her when you come in to let her know where she needs to make the other payment at, how much time she has to do it, and if there is anything else that she needs to do. Please call patient and advice.

## 2016-10-28 NOTE — Anesthesia Postprocedure Evaluation (Signed)
Anesthesia Post Note  Patient: Frances Davidson  Procedure(s) Performed: Procedure(s) (LRB): APPENDECTOMY LAPAROSCOPIC (N/A)  Patient location during evaluation: PACU Anesthesia Type: General Level of consciousness: awake and alert Pain management: pain level controlled Vital Signs Assessment: post-procedure vital signs reviewed and stable Respiratory status: spontaneous breathing, nonlabored ventilation, respiratory function stable and patient connected to nasal cannula oxygen Cardiovascular status: blood pressure returned to baseline and stable Postop Assessment: no signs of nausea or vomiting Anesthetic complications: no     Last Vitals:  Vitals:   10/27/16 1239 10/27/16 1318  BP: 117/86 110/63  Pulse: 84 91  Resp: 16 16  Temp: 36.2 C     Last Pain:  Vitals:   10/27/16 1318  TempSrc:   PainSc: Duryea Adams

## 2016-10-29 LAB — SURGICAL PATHOLOGY

## 2016-10-29 NOTE — Telephone Encounter (Signed)
I have called patient back. I have advised her that when she get's her bill from her surgery, that there will be a number on the statement. She can call that number to set up a payment arrangement at that time.

## 2016-11-06 ENCOUNTER — Ambulatory Visit (INDEPENDENT_AMBULATORY_CARE_PROVIDER_SITE_OTHER): Payer: Self-pay | Admitting: Surgery

## 2016-11-06 ENCOUNTER — Encounter: Payer: Self-pay | Admitting: Surgery

## 2016-11-06 VITALS — BP 133/87 | HR 80 | Temp 97.5°F | Ht 66.0 in | Wt 151.4 lb

## 2016-11-06 DIAGNOSIS — Z9049 Acquired absence of other specified parts of digestive tract: Secondary | ICD-10-CM

## 2016-11-06 DIAGNOSIS — Z8719 Personal history of other diseases of the digestive system: Secondary | ICD-10-CM

## 2016-11-06 NOTE — Patient Instructions (Signed)
We would like for you to try Miralax. Please take 17 grams each day. Please increase your water intake to 72 ounces daily. Please do not lift anything over 20 pounds until November 27, 2016. Please call our office if you have questions or concerns.    Constipation, Adult Constipation is when a person has fewer bowel movements in a week than normal, has difficulty having a bowel movement, or has stools that are dry, hard, or larger than normal. Constipation may be caused by an underlying condition. It may become worse with age if a person takes certain medicines and does not take in enough fluids. Follow these instructions at home: Eating and drinking  Eat foods that have a lot of fiber, such as fresh fruits and vegetables, whole grains, and beans.  Limit foods that are high in fat, low in fiber, or overly processed, such as french fries, hamburgers, cookies, candies, and soda.  Drink enough fluid to keep your urine clear or pale yellow. General instructions  Exercise regularly or as told by your health care provider.  Go to the restroom when you have the urge to go. Do not hold it in.  Take over-the-counter and prescription medicines only as told by your health care provider. These include any fiber supplements.  Practice pelvic floor retraining exercises, such as deep breathing while relaxing the lower abdomen and pelvic floor relaxation during bowel movements.  Watch your condition for any changes.  Keep all follow-up visits as told by your health care provider. This is important. Contact a health care provider if:  You have pain that gets worse.  You have a fever.  You do not have a bowel movement after 4 days.  You vomit.  You are not hungry.  You lose weight.  You are bleeding from the anus.  You have thin, pencil-like stools. Get help right away if:  You have a fever and your symptoms suddenly get worse.  You leak stool or have blood in your stool.  Your abdomen is  bloated.  You have severe pain in your abdomen.  You feel dizzy or you faint. This information is not intended to replace advice given to you by your health care provider. Make sure you discuss any questions you have with your health care provider. Document Released: 06/05/2004 Document Revised: 03/27/2016 Document Reviewed: 02/26/2016 Elsevier Interactive Patient Education  2017 [employer].  .

## 2016-11-06 NOTE — Progress Notes (Signed)
11/06/2016  HPI: Patient is s/p laparoscopic appendectomy on 2/6.  She had presented in 07/2016 with appendicitis with phlegmon vs abscess which resolved conservatively.  Post-op, pt reports not needing much pain medication.  Denies any nausea or vomiting, but does have some constipation.  Reports feeling indigestion after meals and takes pantroprazole for it.  Vital signs: BP 133/87   Pulse 80   Temp 97.5 F (36.4 C) (Oral)   Ht 5\' 6"  (1.676 m)   Wt 68.7 kg (151 lb 6.4 oz)   BMI 24.44 kg/m    Physical Exam: Constitutional: No acute distress Abdomen: soft, nondistended, nontender to palpation.  Incisions are clean, dry, and intact.  No evidence of infection.  No drainage.  Assessment/Plan: 56 yo female s/p interval laparoscopic appendectomy.  --Patient has been advised regarding MiraLax for constipation.  She may take senna or dulcolax if no improvement with Miralax --Patient still has a no heavy lifting restriction of more than 10-15 lbs for 3 more weeks. --Patient may follow up on an as needed basis.   Melvyn Neth, [place]

## 2017-04-21 ENCOUNTER — Ambulatory Visit
Admission: RE | Admit: 2017-04-21 | Discharge: 2017-04-21 | Disposition: A | Discharge status: Home / Self Care | Payer: Self-pay | Source: Ambulatory Visit | Attending: Oncology | Admitting: Oncology

## 2017-04-21 ENCOUNTER — Encounter: Payer: Self-pay | Admitting: *Deleted

## 2017-04-21 ENCOUNTER — Ambulatory Visit: Payer: Self-pay | Attending: Oncology | Admitting: *Deleted

## 2017-04-21 ENCOUNTER — Encounter (INDEPENDENT_AMBULATORY_CARE_PROVIDER_SITE_OTHER): Payer: Self-pay

## 2017-04-21 VITALS — BP 138/99 | HR 78 | Temp 98.4°F | Ht 67.0 in | Wt 147.0 lb

## 2017-04-21 DIAGNOSIS — Z0000 Encounter for general adult medical examination without abnormal findings: Secondary | ICD-10-CM

## 2017-04-21 NOTE — Progress Notes (Signed)
Subjective:     Patient ID: Frances Davidson, female   DOB: [birth year], 56 y.o.   MRN: [id number]  HPI   Review of Systems     Objective:   Physical Exam  Pulmonary/Chest: Right breast exhibits no inverted nipple, no mass, no nipple discharge, no skin change and no tenderness. Left breast exhibits no inverted nipple, no mass, no nipple discharge, no skin change and no tenderness. Breasts are symmetrical.         Assessment:     56 year old Black female returns to [facility] for annual screening.  Clinical breast exam with diffuse grainy fibroglandular like tissue making the exam more difficult.  No dominant mass, skin changes, nipple discharge or lymphadenopathy noted.  Taught self breast awareness.  Last pap on 12/06/13 was negative / negative.  Last pelvic exam was normal in 2017.  Encouraged pelvic exam next year and next pap due in 2020.  Blood pressure elevated at 138/99.  She is to recheck her blood pressure at [facility] or [facility], and if remains higher than 140/90 she is to follow-up with her primary care provider.  Hand out on hypertention given to patient.  Patient has been screened for eligibility.  She does not have any insurance, Medicare or Medicaid.  She also meets financial eligibility.  Hand-out given on the Affordable Care Act.    Plan:     Screening mammogram ordered.  Will follow-up per [facility] protocol.

## 2017-04-21 NOTE — Patient Instructions (Signed)

## 2017-04-22 ENCOUNTER — Encounter: Payer: Self-pay | Admitting: *Deleted

## 2017-04-22 NOTE — Progress Notes (Signed)
Letter mailed from the [facility] to inform patient of her normal mammogram results.  Patient is to follow-up with annual screening in one year.  HSIS to Christy. 

## 2018-04-27 ENCOUNTER — Encounter: Payer: Self-pay | Admitting: *Deleted

## 2018-04-27 ENCOUNTER — Ambulatory Visit
Admission: RE | Admit: 2018-04-27 | Discharge: 2018-04-27 | Disposition: A | Discharge status: Home / Self Care | Payer: Self-pay | Source: Ambulatory Visit | Attending: Oncology | Admitting: Oncology

## 2018-04-27 ENCOUNTER — Encounter (INDEPENDENT_AMBULATORY_CARE_PROVIDER_SITE_OTHER): Payer: Self-pay

## 2018-04-27 ENCOUNTER — Ambulatory Visit: Payer: Self-pay | Attending: Oncology | Admitting: *Deleted

## 2018-04-27 VITALS — BP 122/77 | HR 73 | Temp 98.4°F | Resp 12 | Ht 66.0 in | Wt 155.0 lb

## 2018-04-27 DIAGNOSIS — Z0000 Encounter for general adult medical examination without abnormal findings: Secondary | ICD-10-CM | POA: Insufficient documentation

## 2018-04-27 NOTE — Patient Instructions (Signed)
Gave patient hand-out, Women Staying Healthy, Active and Well from [facility], with education on breast health, pap smears, heart and colon health. 

## 2018-04-27 NOTE — Progress Notes (Signed)
  Subjective:     Patient ID: Frances Davidson, female   DOB: December 29, 1960, 57 y.o.   MRN: [id number]  HPI   Review of Systems     Objective:   Physical Exam  Pulmonary/Chest: Right breast exhibits no inverted nipple, no mass, no nipple discharge, no skin change and no tenderness. Left breast exhibits no inverted nipple, no mass, no nipple discharge, no skin change and no tenderness. Breasts are asymmetrical.  Right breast slightly larger than the left         Assessment:     57 year old Black female returns to [facility] for annual screening.  Bilateral breast with diffuse fibroglandular like tissue making the clinical breast exam more difficult.  There is no dominant mass, skin changes, nipple discharge or lymphadenopathy.  Taught self breast awareness.  Last pap on 12/06/13 was negative / negative.  Next pap due in 2020.  Patient has been screened for eligibility.  She does not have any insurance, Medicare or Medicaid.  She also meets financial eligibility.  Hand-out given on the Affordable Care Act. Risk Assessment    Risk Scores      04/27/2018   Last edited by: Manus Rudd, RN   5-year risk: 1.4 %   Lifetime risk: 7.1 %            Plan:     Screening mammogram ordered.  Will follow-up per [facility] protocol.

## 2018-04-28 ENCOUNTER — Encounter: Payer: Self-pay | Admitting: *Deleted

## 2018-04-28 NOTE — Progress Notes (Signed)
Letter mailed from the [facility] to inform patient of her normal mammogram results.  Patient is to follow-up with annual screening in one year.  HSIS to Christy. 

## 2019-04-21 ENCOUNTER — Other Ambulatory Visit: Payer: Self-pay

## 2019-04-21 DIAGNOSIS — Z20822 Contact with and (suspected) exposure to covid-19: Secondary | ICD-10-CM

## 2019-04-24 LAB — NOVEL CORONAVIRUS, NAA: SARS-CoV-2, NAA: NOT DETECTED

## 2019-06-07 ENCOUNTER — Other Ambulatory Visit: Payer: Self-pay

## 2019-06-07 ENCOUNTER — Ambulatory Visit
Admission: RE | Admit: 2019-06-07 | Discharge: 2019-06-07 | Disposition: A | Discharge status: Home / Self Care | Payer: Self-pay | Source: Ambulatory Visit | Attending: Oncology | Admitting: Oncology

## 2019-06-07 ENCOUNTER — Ambulatory Visit: Payer: Self-pay | Attending: Oncology | Admitting: *Deleted

## 2019-06-07 VITALS — BP 126/92 | HR 80 | Temp 97.8°F | Ht 65.0 in | Wt 155.0 lb

## 2019-06-07 DIAGNOSIS — Z0000 Encounter for general adult medical examination without abnormal findings: Secondary | ICD-10-CM

## 2019-06-07 NOTE — Progress Notes (Signed)
  Subjective:     Patient ID: Frances Davidson, female   DOB: [birth year], 58 y.o.   MRN: [id number]  HPI   Review of Systems     Objective:   Physical Exam Chest:     Breasts: Breasts are asymmetrical.        Right: No swelling, bleeding, inverted nipple, mass, nipple discharge, skin change or tenderness.        Left: No swelling, bleeding, inverted nipple, mass, nipple discharge, skin change or tenderness.       Comments: Left breast larger than the right Abdominal:     Palpations: There is no hepatomegaly or splenomegaly.  Genitourinary:    Exam position: Lithotomy position.     Labia:        Right: No rash, tenderness, lesion or injury.        Left: No rash, tenderness, lesion or injury.      Urethra: No prolapse, urethral pain, urethral swelling or urethral lesion.     Vagina: No signs of injury and foreign body. No vaginal discharge, erythema, tenderness, bleeding, lesions or prolapsed vaginal walls.     Cervix: No cervical motion tenderness, discharge, friability, lesion, erythema, cervical bleeding or eversion.     Uterus: Not deviated, not enlarged, not fixed, not tender and no uterine prolapse.      Adnexa:        Right: No mass, tenderness or fullness.         Left: No mass, tenderness or fullness.       Rectum: No mass.  Lymphadenopathy:     Upper Body:     Right upper body: No supraclavicular or axillary adenopathy.     Left upper body: No supraclavicular or axillary adenopathy.        Assessment:     58 year old Black female returns to [facility] for annual screening.  Clinical breast exam unremarkable.  Taught self breast awareness.  Specimen collected for pap smear without difficulty.  Patient has been screened for eligibility.  She does not have any insurance, Medicare or Medicaid.  She also meets financial eligibility.  Hand-out given on the Affordable Care Act. Risk Assessment    No risk assessment data for the current encounter   Risk Scores      06/02/2019    Last edited by: Orson Slick, CMA   5-year risk: 1.4 %   Lifetime risk: 6.9 %            Plan:     Screening mammogram ordered.  Specimen for pap smear sent to the lab.  Will follow-up per [facility]CP protocol.

## 2019-06-16 LAB — PAP LB AND HPV HIGH-RISK: HPV, high-risk: NEGATIVE

## 2019-06-30 ENCOUNTER — Encounter: Payer: Self-pay | Admitting: *Deleted

## 2019-06-30 NOTE — Progress Notes (Signed)
Letter mailed to inform patient of her normal mammogram and pap smear.  Next mammo due in 1 year and next pap due in 5 years.  HSIS to [place].

## 2019-11-26 ENCOUNTER — Ambulatory Visit: Payer: Self-pay | Attending: Internal Medicine

## 2019-11-26 DIAGNOSIS — Z23 Encounter for immunization: Secondary | ICD-10-CM | POA: Insufficient documentation

## 2019-11-26 NOTE — Progress Notes (Signed)
   Covid-19 Vaccination Clinic  Name:  Frances Davidson    MRN: [id number] DOB: 1961/06/19  11/26/2019  [name] was observed post Covid-19 immunization for 15 minutes without incident. She was provided with Vaccine Information Sheet and instruction to access the V-Safe system.   [name] was instructed to call 911 with any severe reactions post vaccine: Marland Kitchen Difficulty breathing  . Swelling of face and throat  . A fast heartbeat  . A bad rash all over body  . Dizziness and weakness   Immunizations Administered    Name Date Dose VIS Date Route   Pfizer COVID-19 Vaccine 11/26/2019  9:01 AM 0.3 mL 09/01/2019 Intramuscular   Manufacturer: [place]   Lot: [id number]   [place]: [id number]

## 2019-12-19 ENCOUNTER — Ambulatory Visit: Payer: Self-pay | Attending: Internal Medicine

## 2019-12-19 DIAGNOSIS — Z23 Encounter for immunization: Secondary | ICD-10-CM

## 2019-12-19 NOTE — Progress Notes (Signed)
   Covid-19 Vaccination Clinic  Name:  Frances Davidson    MRN: [id number] DOB: [birth year]  12/19/2019  Frances Davidson was observed post Covid-19 immunization for 15 minutes without incident. She was provided with Vaccine Information Sheet and instruction to access the V-Safe system.   Frances Davidson was instructed to call 911 with any severe reactions post vaccine: Marland Kitchen Difficulty breathing  . Swelling of face and throat  . A fast heartbeat  . A bad rash all over body  . Dizziness and weakness   Immunizations Administered    Name Date Dose VIS Date Route   Pfizer COVID-19 Vaccine 12/19/2019  8:58 AM 0.3 mL 09/01/2019 Intramuscular   Manufacturer: [place]   Lot: [id number]   [place]: [id number]

## 2020-02-23 ENCOUNTER — Ambulatory Visit: Payer: Self-pay | Admitting: Nurse Practitioner

## 2020-06-12 ENCOUNTER — Ambulatory Visit: Payer: Self-pay | Attending: Oncology | Admitting: *Deleted

## 2020-06-12 ENCOUNTER — Other Ambulatory Visit: Payer: Self-pay

## 2020-06-12 ENCOUNTER — Encounter: Payer: Self-pay | Admitting: *Deleted

## 2020-06-12 ENCOUNTER — Ambulatory Visit
Admission: RE | Admit: 2020-06-12 | Discharge: 2020-06-12 | Disposition: A | Discharge status: Home / Self Care | Payer: Medicaid Other | Source: Ambulatory Visit | Attending: Oncology | Admitting: Oncology

## 2020-06-12 VITALS — BP 119/92 | HR 85 | Temp 98.4°F | Ht 68.0 in | Wt 150.0 lb

## 2020-06-12 DIAGNOSIS — Z0000 Encounter for general adult medical examination without abnormal findings: Secondary | ICD-10-CM

## 2020-06-12 NOTE — Patient Instructions (Signed)
Gave patient hand-out, Women Staying Healthy, Active and Well from [facility], with education on breast health, pap smears, heart and colon health. 

## 2020-06-12 NOTE — Progress Notes (Signed)
Subjective:     Patient ID: Frances Davidson, female   DOB: 12-Jul-1961, 58 y.o.   MRN: [id number]  HPI   [facility] Medical History Record - 06/12/20 0944      Breast History   Screening cycle Rescreen    CBE Date 06/07/19    Provider (CBE) [facility]    Initial Mammogram 06/12/20    Last Mammogram Annual    Last Mammogram Date 06/07/19    Provider (Mammogram)  [facility]    Recent Breast Symptoms None      Breast Cancer History   Breast Cancer History No personal or family history      Previous History of Breast Problems   Breast Surgery or Biopsy Left    Breast Implants N/A    BSE Done Monthly      Gynecological/Obstetrical History   Is there any chance that the client could be pregnant?  No    Age at menarche 77    Age at menopause 24    PAP smear history Annually    Date of last PAP  06/07/19    Provider (PAP) [facility]    Age at first live birth 58    Breast fed children No    DES Exposure No    Cervical, Uterine or Ovarian cancer No    Family history of Cervial, Uterine or Ovarian cancer No    Hysterectomy No    Cervix removed No    Ovaries removed No    Laser/Cryosurgery No    Current method of birth control Other (see comments)   tubal ligation   Current method of Estrogen/Hormone replacement None    Smoking history None            Review of Systems     Objective:   Physical Exam Exam conducted with a chaperone present.  Chest:     Breasts:        Right: No swelling, bleeding, inverted nipple, mass, nipple discharge, skin change or tenderness.        Left: No swelling, bleeding, inverted nipple, mass, nipple discharge, skin change or tenderness.    Abdominal:     Palpations: There is no hepatomegaly, splenomegaly or mass.  Genitourinary:    Exam position: Lithotomy position.     Labia:        Right: No rash, tenderness, lesion or injury.        Left: No rash, tenderness, lesion or injury.      Urethra: No prolapse, urethral pain, urethral swelling or  urethral lesion.     Vagina: No signs of injury and foreign body. No vaginal discharge, erythema, tenderness, bleeding, lesions or prolapsed vaginal walls.     Cervix: No cervical motion tenderness or discharge.  Lymphadenopathy:     Upper Body:     Right upper body: No supraclavicular or axillary adenopathy.     Left upper body: No supraclavicular or axillary adenopathy.        Assessment:     59 year old female returns to [facility] for annual screening.  She is accompanied by her husband today and would like for him to stay in the room during the exam.  Clinical breast exam with bilateral breast with diffuse fibroglandular like tissue.  There is no dominant mass, skin changes, nipple discharge or lymphadenopathy.  Taught self breast awareness.  Patient states she had vaginal "itching and pain" for a few weeks that has since resolved.  She would like for me to do a  vaginal exam to see if there is any residual problem.  Last pap on 06/07/19 was negative/ negative. On visual inspection of the labia and vagina there is no lesions or rashes noted. Patient has been screened for eligibility.  She does not have any insurance, Medicare or Medicaid.  She also meets financial eligibility.   Risk Assessment    Risk Scores      06/12/2020 06/02/2019   Last edited by: Orson Slick, CMA [place], [place], [place]   5-year risk: 1.5 % 1.4 %   Lifetime risk: 6.7 % 6.9 %             Plan:     Screening mammogram ordered.  Will follow up per [facility]CP protocol.

## 2020-06-13 ENCOUNTER — Encounter: Payer: Self-pay | Admitting: *Deleted

## 2020-06-13 NOTE — Progress Notes (Signed)
Letter mailed from [facility] to inform patient of her normal mammogram results.  Patient is to follow-up with annual screening in one year. 

## 2021-02-10 ENCOUNTER — Encounter: Payer: Self-pay | Admitting: Nurse Practitioner

## 2021-02-10 ENCOUNTER — Other Ambulatory Visit: Payer: Self-pay

## 2021-02-10 ENCOUNTER — Encounter: Payer: Self-pay | Admitting: *Deleted

## 2021-02-10 ENCOUNTER — Ambulatory Visit (INDEPENDENT_AMBULATORY_CARE_PROVIDER_SITE_OTHER): Payer: Self-pay | Admitting: Nurse Practitioner

## 2021-02-10 VITALS — BP 93/62 | HR 93 | Temp 98.9°F | Ht 65.0 in | Wt 144.8 lb

## 2021-02-10 DIAGNOSIS — M25561 Pain in right knee: Secondary | ICD-10-CM

## 2021-02-10 DIAGNOSIS — R197 Diarrhea, unspecified: Secondary | ICD-10-CM

## 2021-02-10 DIAGNOSIS — Z7689 Persons encountering health services in other specified circumstances: Secondary | ICD-10-CM

## 2021-02-10 DIAGNOSIS — Z1211 Encounter for screening for malignant neoplasm of colon: Secondary | ICD-10-CM

## 2021-02-10 DIAGNOSIS — F320 Major depressive disorder, single episode, mild: Secondary | ICD-10-CM | POA: Insufficient documentation

## 2021-02-10 DIAGNOSIS — Z136 Encounter for screening for cardiovascular disorders: Secondary | ICD-10-CM

## 2021-02-10 DIAGNOSIS — Z1159 Encounter for screening for other viral diseases: Secondary | ICD-10-CM

## 2021-02-10 DIAGNOSIS — G8929 Other chronic pain: Secondary | ICD-10-CM

## 2021-02-10 MED ORDER — SERTRALINE HCL 25 MG PO TABS
25.0000 mg | ORAL_TABLET | Freq: Every day | ORAL | 0 refills | Status: DC
Start: 2021-02-10 — End: 2021-03-10

## 2021-02-10 NOTE — Assessment & Plan Note (Signed)
Chronic.  Ongoing.  Begin Zoloft daily. Side effects and benefits of medication discussed with patient during visit. Return in 1 month for reevaluation.

## 2021-02-10 NOTE — Progress Notes (Signed)
BP 93/62 (BP Location: Left Arm, Cuff Size: Normal)   Pulse 93   Temp 98.9 F (37.2 C) (Oral)   Ht 5\' 5"  (1.651 m)   Wt 144 lb 12.8 oz (65.7 kg)   LMP 04/21/2014 (Approximate)   SpO2 99%   BMI 24.10 kg/m    Subjective:    Patient ID: Frances Davidson, female    DOB: 1961-03-06, 60 y.o.   MRN: [id number]  HPI: Frances Davidson is a 60 y.o. female  Chief Complaint  Patient presents with  . Establish Care  . Abdominal Pain    Pt states she has had stomach problems since 2018. States she has had issues after she eats, states food just runs right through her  . Knee Pain    Pt states she has had R knee pain for the last few weeks. States it hurts all the time, sitting and standing. No known injuries per patient.    Patient presents to clinic to establish care with new PCP.  Patient reports a history of Depression, Anxiety, IBS- undiagnosed.  Patient has never had a colonoscopy.   Patient denies a history of: Hypertension, Elevated Cholesterol, Thyroid problems, Diabetes, Depression, Anxiety, Neurological problems.  ABDOMINAL PAIN Patient reports that after eating anything she has to go straight to the bathroom.  It does not matter what she eats.  Patient states that water her bothers her stomach. She has to force herself to drink. She denies nausea. Patient states she has heart burn. Patient takes pepto bismal and omeprazole.  Patient has a history of appendicitis for which she had surgery.  History of a breast biopsy in 1977.   KNEE PAIN Patient states that she has been having right knee pain.  She was having to help pick her mom up and since then (about 2 months ago) her knee hasn't stopped hurting.  Patient states that bending down makes it worse. Feels better when she rubs it. Pain is 5/10.  Sometimes she can't walk.  States it feels like it may give out on her.  Pain is worse in the morning.  Patient states she has a slight amount of swelling.    DEPRESSION Patient has a lot  of stress in her life right now with caring for her mom. She is away from home in [place] for several months at a time.  Patient states she has never been on medication for depression. Patient states she does not want medication at this time.  States she does have difficulty sleeping because she has a lot on her mind.  Denies SI.   [place] Office Visit from 02/10/2021 in [place]  PHQ-9 Total Score 12       Relevant past medical, surgical, family and social history reviewed and updated as indicated. Interim medical history since our last visit reviewed. Allergies and medications reviewed and updated.  Review of Systems  Constitutional: Positive for appetite change.  Eyes: Negative for visual disturbance.  Respiratory: Negative for cough, chest tightness and shortness of breath.   Cardiovascular: Negative for chest pain, palpitations and leg swelling.  Gastrointestinal: Positive for abdominal pain and diarrhea.  Musculoskeletal:       Right knee pain  Neurological: Negative for dizziness and headaches.  Psychiatric/Behavioral: Positive for dysphoric mood and sleep disturbance. Negative for suicidal ideas. The patient is nervous/anxious.     Per HPI unless specifically indicated above     Objective:    BP 93/62 (BP Location: Left Arm, Cuff  Size: Normal)   Pulse 93   Temp 98.9 F (37.2 C) (Oral)   Ht 5\' 5"  (1.651 m)   Wt 144 lb 12.8 oz (65.7 kg)   LMP 04/21/2014 (Approximate)   SpO2 99%   BMI 24.10 kg/m   Wt Readings from Last 3 Encounters:  02/10/21 144 lb 12.8 oz (65.7 kg)  06/12/20 150 lb (68 kg)  06/07/19 155 lb (70.3 kg)    Physical Exam Vitals and nursing note reviewed.  Constitutional:      General: She is awake. She is not in acute distress.    Appearance: She is well-developed. She is not ill-appearing.  HENT:     Head: Normocephalic and atraumatic.     Right Ear: Hearing, tympanic membrane, ear canal and external ear normal. No  drainage.     Left Ear: Hearing, tympanic membrane, ear canal and external ear normal. No drainage.     Nose: Nose normal.     Right Sinus: No maxillary sinus tenderness or frontal sinus tenderness.     Left Sinus: No maxillary sinus tenderness or frontal sinus tenderness.     Mouth/Throat:     Mouth: Mucous membranes are moist.     Pharynx: Oropharynx is clear. Uvula midline. No pharyngeal swelling, oropharyngeal exudate or posterior oropharyngeal erythema.  Eyes:     General: Lids are normal.        Right eye: No discharge.        Left eye: No discharge.     Extraocular Movements: Extraocular movements intact.     Conjunctiva/sclera: Conjunctivae normal.     Pupils: Pupils are equal, round, and reactive to light.     Visual Fields: Right eye visual fields normal and left eye visual fields normal.  Neck:     Thyroid: No thyromegaly.     Vascular: No carotid bruit.     Trachea: Trachea normal.  Cardiovascular:     Rate and Rhythm: Normal rate and regular rhythm.     Heart sounds: Normal heart sounds. No murmur heard. No gallop.   Pulmonary:     Effort: Pulmonary effort is normal. No accessory muscle usage or respiratory distress.     Breath sounds: Normal breath sounds.  Chest:  Breasts:     Right: Normal. No axillary adenopathy or supraclavicular adenopathy.     Left: Normal. No axillary adenopathy or supraclavicular adenopathy.    Abdominal:     General: Bowel sounds are normal.     Palpations: Abdomen is soft. There is no hepatomegaly or splenomegaly.     Tenderness: There is abdominal tenderness in the periumbilical area.  Musculoskeletal:        General: Normal range of motion.     Cervical back: Normal range of motion and neck supple.     Right lower leg: No edema.     Left lower leg: No edema.  Lymphadenopathy:     Head:     Right side of head: No submental, submandibular, tonsillar, preauricular or posterior auricular adenopathy.     Left side of head: No  submental, submandibular, tonsillar, preauricular or posterior auricular adenopathy.     Cervical: No cervical adenopathy.     Upper Body:     Right upper body: No supraclavicular, axillary or pectoral adenopathy.     Left upper body: No supraclavicular, axillary or pectoral adenopathy.  Skin:    General: Skin is warm and dry.     Capillary Refill: Capillary refill takes less than 2 seconds.  Findings: No rash.  Neurological:     Mental Status: She is alert and oriented to person, place, and time.     Cranial Nerves: Cranial nerves are intact.     Gait: Gait is intact.     Deep Tendon Reflexes: Reflexes are normal and symmetric.     Reflex Scores:      Brachioradialis reflexes are 2+ on the right side and 2+ on the left side.      Patellar reflexes are 2+ on the right side and 2+ on the left side. Psychiatric:        Attention and Perception: Attention normal.        Mood and Affect: Mood normal.        Speech: Speech normal.        Behavior: Behavior normal. Behavior is cooperative.        Thought Content: Thought content normal.        Judgment: Judgment normal.     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  Ongoing.  Begin Zoloft daily. Side effects and benefits of medication discussed with patient during visit. Return in 1 month for reevaluation.      Relevant Medications   sertraline (ZOLOFT) 25 MG tablet    Other Visit Diagnoses    Diarrhea, unspecified type       Recommend seeing GI for evaluation of ongoing symptoms.   Relevant Orders   Ambulatory referral to  Gastroenterology   Screening for colon cancer       Relevant Orders   Ambulatory referral to Gastroenterology   Chronic pain of right knee       Recommend obtaining xray of knee. Will make recommendations based on xray results.    Relevant Orders   DG Knee Complete 4 Views Right   Encounter to establish care           Follow up plan: Return in about 1 year (around 02/10/2022) for Depression/Anxiety FU.

## 2021-02-14 ENCOUNTER — Encounter: Payer: Self-pay | Admitting: Nurse Practitioner

## 2021-02-18 ENCOUNTER — Other Ambulatory Visit: Payer: Self-pay

## 2021-02-18 ENCOUNTER — Ambulatory Visit (INDEPENDENT_AMBULATORY_CARE_PROVIDER_SITE_OTHER): Payer: Medicaid Other | Admitting: Obstetrics

## 2021-02-18 ENCOUNTER — Other Ambulatory Visit (HOSPITAL_COMMUNITY)
Admission: RE | Admit: 2021-02-18 | Discharge: 2021-02-18 | Disposition: A | Discharge status: Home / Self Care | Payer: Medicaid Other | Source: Ambulatory Visit | Attending: Obstetrics | Admitting: Obstetrics

## 2021-02-18 ENCOUNTER — Encounter: Payer: Self-pay | Admitting: Obstetrics

## 2021-02-18 VITALS — BP 120/76 | Ht 65.0 in | Wt 144.2 lb

## 2021-02-18 DIAGNOSIS — Z01419 Encounter for gynecological examination (general) (routine) without abnormal findings: Secondary | ICD-10-CM

## 2021-02-18 DIAGNOSIS — Z809 Family history of malignant neoplasm, unspecified: Secondary | ICD-10-CM | POA: Diagnosis not present

## 2021-02-18 DIAGNOSIS — Z9189 Other specified personal risk factors, not elsewhere classified: Secondary | ICD-10-CM

## 2021-02-18 DIAGNOSIS — Z124 Encounter for screening for malignant neoplasm of cervix: Secondary | ICD-10-CM

## 2021-02-18 DIAGNOSIS — Z1231 Encounter for screening mammogram for malignant neoplasm of breast: Secondary | ICD-10-CM

## 2021-02-18 DIAGNOSIS — Z1151 Encounter for screening for human papillomavirus (HPV): Secondary | ICD-10-CM | POA: Insufficient documentation

## 2021-02-18 DIAGNOSIS — Z1211 Encounter for screening for malignant neoplasm of colon: Secondary | ICD-10-CM

## 2021-02-18 NOTE — Progress Notes (Signed)
Gynecology Annual Exam  PCP: Jon Billings, NP  Chief Complaint:  Chief Complaint  Patient presents with  . Gynecologic Exam    History of Present Illness:Patient is a 60 y.o. No obstetric history on file. presents for annual exam. The patient has no complaints today. She is new to [place] has not had a GYN annual exam in many years. She is requesting cologard order. Recenly got some insurance, and has a PCP now. Frances Davidson is singel  And engaged- She is postmenoparusal and is sexually active.  There is  Strong family hx of Cancer: her mother had ovarian, bladder and uterine cancer.  LMP: Patient's last menstrual period was 04/21/2014 (approximate). She is postmenopausal. The patient is sexually active. She denies dyspareunia.  The patient does perform self breast exams.  There is notable family history of breast or ovarian cancer in her family.  The patient wears seatbelts: yes.   The patient has regular exercise: yes.    The patient denies current symptoms of depression.   She has been started on daily Zoloft for hot flashes and some depressive sxs, by her PCP  Review of Systems: Review of Systems  Constitutional: Positive for malaise/fatigue.       Noted since she started on daily Sertraline  HENT: Negative.   Eyes: Negative.   Respiratory: Negative.   Cardiovascular: Negative.   Gastrointestinal: Negative.   Genitourinary: Negative.   Musculoskeletal: Positive for joint pain.  Skin: Negative.   Neurological: Negative.   Endo/Heme/Allergies: Negative.   Psychiatric/Behavioral: Positive for depression. The patient is nervous/anxious.        Seen by her PCP recenntly, and started on Zoloft.    Past Medical History:  Patient Active Problem List   Diagnosis Date Noted  . Family history of cancer in mother 02/18/2021    02/18/2021 [facility] testing drawn at [place] Mother had bladder, ovarian and uterine cancer   . Depression, major, single episode,  mild ([place]) 02/10/2021  . H/O appendicitis   . Appendiceal abscess     Past Surgical History:  Past Surgical History:  Procedure Laterality Date  . BREAST EXCISIONAL BIOPSY Left 1976  . LAPAROSCOPIC APPENDECTOMY N/A 10/27/2016   Procedure: APPENDECTOMY LAPAROSCOPIC;  Surgeon: Olean Ree, MD;  Location: [facility] ORS;  Service: General;  Laterality: N/A;  . [place]    Gynecologic History:  Patient's last menstrual period was 04/21/2014 (approximate). Last Pap: Results were: NILM no abnormalities  Last mammogram: ordered for this year. Results were: BI-RAD I  Obstetric History: No obstetric history on file.  Family History:  Family History  Problem Relation Age of Onset  . Diabetes Mother   . Bladder Cancer Mother   . Cervical cancer Mother   . Thyroid cancer Mother   . Lung cancer Father   . Hypertension Father   . Hypertension Brother   . Hypertension Daughter   . Hypertension Son   . Diabetes Son   . Cancer Maternal Grandmother   . Diabetes Maternal Grandmother   . Cancer Maternal Grandfather   . Diabetes Maternal Grandfather   . Cancer Paternal Grandmother   . Diabetes Paternal Grandmother   . Cancer Paternal Grandfather   . Diabetes Paternal Grandfather   . Lymphoma Brother   . Breast cancer Neg Hx     Social History:  Social History   Socioeconomic History  . Marital status: Single    Spouse name: Not on file  . Number of  children: Not on file  . Years of education: Not on file  . Highest education level: Not on file  Occupational History  . Not on file  Tobacco Use  . Smoking status: Never Smoker  . Smokeless tobacco: Never Used  Vaping Use  . Vaping Use: Never used  Substance and Sexual Activity  . Alcohol use: Yes    Comment: occasional  . Drug use: No  . Sexual activity: Yes  Other Topics Concern  . Not on file  Social History Narrative   ** Merged History Encounter **       Social Determinants of Health   Financial Resource  Strain: Not on file  Food Insecurity: Not on file  Transportation Needs: Not on file  Physical Activity: Not on file  Stress: Not on file  Social Connections: Not on file  Intimate Partner Violence: Not on file    Allergies:  No Known Allergies  Medications: Prior to Admission medications   Medication Sig Start Date End Date Taking? Authorizing Provider  sertraline (ZOLOFT) 25 MG tablet Take 1 tablet (25 mg total) by mouth daily. 02/10/21   Jon Billings, NP    Physical Exam Vitals: Blood pressure 120/76, height _0  (1.651 m), weight 144 lb 3.2 oz (65.4 kg), last menstrual period 04/21/2014.  General: NAD HEENT: normocephalic, anicteric Thyroid: no enlargement, no palpable nodules Pulmonary: No increased work of breathing, CTAB Cardiovascular: RRR, distal pulses 2+ Breast: Breast symmetrical,pendulous, slight tenderness and on left breast increased "thickening" to the tissue in the upper outer quadrant of the left., no skin or nipple retraction present, no nipple discharge.  No axillary or supraclavicular lymphadenopathy. Abdomen: NABS, soft, non-tender, non-distended.  Umbilicus without lesions.  No hepatomegaly, splenomegaly or masses palpable. No evidence of hernia  Genitourinary:  External: Normal external female genitalia.  Normal urethral meatus, normal Bartholin's and Skene's glands.    Vagina: Normal vaginal mucosa, no evidence of prolapse.    Cervix: Grossly normal in appearance, no bleeding  Uterus: Non-enlarged, slightly retrovertedmobile, normal contour.  No CMT  Adnexa: ovaries non-enlarged, no adnexal masses  Rectal: deferred  Lymphatic: no evidence of inguinal lymphadenopathy Extremities: no edema, erythema, or tenderness Neurologic: Grossly intact Psychiatric: mood appropriate, affect full  Female chaperone present for pelvic and breast  portions of the physical exam     Assessment: 60 y.o. No obstetric history on file. routine annual exam [facility]  testing today due to family hx of cancer. Slight tissue "thickning " in her left breast- will do a mammogram order today. Cologard ordfered for her- no hx of Colon Cancer screening previously.  Plan: Problem List Items Addressed This Visit      Other   Family history of cancer in mother   Relevant Orders   Integrated BRACAnalysis ([place])    Other Visit Diagnoses    Pap smear for cervical cancer screening    -  Primary   Relevant Orders   Cytology - PAP   Women's annual routine gynecological examination       Cervical cancer screening       Screening for colon cancer       Relevant Orders   Cologuard   Increased risk for hereditary cancer syndrome       Relevant Orders   Integrated BRACAnalysis ([place])      1) Mammogram - recommend yearly screening mammogram.  Mammogram Was ordered today to be done sooner .  2) STI screening  was offered and  declined  3) ASCCP guidelines and rational discussed.  Patient opts for discontinue age >17 screening interval  4) Osteoporosis - addressed with her PCP - per USPTF routine screening DEXA at age 84   Consider FDA-approved medical therapies in postmenopausal women and men aged 35 years and older, based on the following: a) A hip or vertebral (clinical or morphometric) fracture b) T-score ? -2.5 at the femoral neck or spine after appropriate evaluation to exclude secondary causes C) Low bone mass (T-score between -1.0 and -2.5 at the femoral neck or spine) and a 10-year probability of a hip fracture ? 3% or a 10-year probability of a major osteoporosis-related fracture ? 20% based on the [place]-adapted WHO algorithm   5) Routine healthcare maintenance including cholesterol, diabetes screening discussed managed by PCP  6) Colonoscopy screening needed. cologard ordered for her..  Screening recommended starting at age 77 for average risk individuals, age 18 for individuals deemed at increased risk  (including African Americans) and recommended to continue until age 44.  For patient age 23-85 individualized approach is recommended.  Gold standard screening is via colonoscopy, Cologuard screening is an acceptable alternative for patient unwilling or unable to undergo colonoscopy.  "Colorectal cancer screening for average?risk adults: 2018 guideline update from the [place]: A Cancer Journal for Clinicians: Feb 17, 2017   7) Return in about 1 year (around 02/18/2022) for annual.  8)Will order her mammogram  To be done sooner, due to today's breast exam.    Imagene Riches, CNM  02/18/2021 10:30 AM   Mosetta Pigeon, [facility] 02/18/2021, 10:02 AM

## 2021-02-20 LAB — CYTOLOGY - PAP
Comment: NEGATIVE
Diagnosis: NEGATIVE
High risk HPV: NEGATIVE

## 2021-02-21 ENCOUNTER — Encounter: Payer: Self-pay | Admitting: Obstetrics

## 2021-02-26 LAB — COLOGUARD: Cologuard: NEGATIVE

## 2021-03-10 ENCOUNTER — Encounter: Payer: Self-pay | Admitting: Nurse Practitioner

## 2021-03-10 ENCOUNTER — Ambulatory Visit (INDEPENDENT_AMBULATORY_CARE_PROVIDER_SITE_OTHER): Payer: Self-pay | Admitting: Nurse Practitioner

## 2021-03-10 ENCOUNTER — Telehealth: Payer: Self-pay

## 2021-03-10 ENCOUNTER — Telehealth: Payer: Self-pay | Admitting: Nurse Practitioner

## 2021-03-10 ENCOUNTER — Other Ambulatory Visit: Payer: Self-pay

## 2021-03-10 VITALS — BP 130/91 | HR 91 | Temp 98.6°F | Wt 143.5 lb

## 2021-03-10 DIAGNOSIS — F320 Major depressive disorder, single episode, mild: Secondary | ICD-10-CM

## 2021-03-10 DIAGNOSIS — R197 Diarrhea, unspecified: Secondary | ICD-10-CM

## 2021-03-10 DIAGNOSIS — Z1159 Encounter for screening for other viral diseases: Secondary | ICD-10-CM

## 2021-03-10 DIAGNOSIS — R1013 Epigastric pain: Secondary | ICD-10-CM

## 2021-03-10 DIAGNOSIS — Z599 Problem related to housing and economic circumstances, unspecified: Secondary | ICD-10-CM

## 2021-03-10 DIAGNOSIS — Z136 Encounter for screening for cardiovascular disorders: Secondary | ICD-10-CM

## 2021-03-10 LAB — URINALYSIS, ROUTINE W REFLEX MICROSCOPIC
Bilirubin, UA: NEGATIVE
Glucose, UA: NEGATIVE
Ketones, UA: NEGATIVE
Leukocytes,UA: NEGATIVE
Nitrite, UA: NEGATIVE
Protein,UA: NEGATIVE
RBC, UA: NEGATIVE
Specific Gravity, UA: <1.005 — ABNORMAL LOW (ref 1.005–1.030)
Urobilinogen, Ur: 0.2 mg/dL (ref 0.2–1.0)
pH, UA: 5.5 (ref 5.0–7.5)

## 2021-03-10 NOTE — Telephone Encounter (Signed)
Pt is calling stating that she missed a call.

## 2021-03-10 NOTE — Assessment & Plan Note (Signed)
Chronic.  Improved from prior.  Stable off medication. Denies SI. Return to clinic in 2 months for reevaluation.  Call sooner if concerns arise.

## 2021-03-10 NOTE — Progress Notes (Signed)
BP (!) 130/91   Pulse 91   Temp 98.6 F (37 C)   Wt 143 lb 8 oz (65.1 kg)   LMP 04/21/2014 (Approximate)   SpO2 98%   BMI 23.88 kg/m    Subjective:    Patient ID: Frances Davidson, female    DOB: 11-02-1960, 60 y.o.   MRN: [id number]  HPI: Frances Davidson is a 60 y.o. female  Chief Complaint  Patient presents with   Depression   Anxiety   Irritable Bowel Syndrome   DEPRESSION/ ANXIETY Patient sates she took the zoloft for a couple of weeks and it made her extremely tired.  She weaned off of the medication and feels a lot better. She believes she can deal with this without medication at this time. Denies SI.  Depression screen [facility] 2/9 03/10/2021 02/10/2021  Decreased Interest 0 1  Down, Depressed, Hopeless 0 1  PHQ - 2 Score 0 2  Altered sleeping 0 3  Tired, decreased energy 1 3  Change in appetite 0 3  Feeling bad or failure about yourself  0 0  Trouble concentrating 0 1  Moving slowly or fidgety/restless 0 0  Suicidal thoughts 0 0  PHQ-9 Score 1 12  Difficult doing work/chores Not difficult at all Not difficult at all   GAD 7 : Generalized Anxiety Score 03/10/2021  Nervous, Anxious, on Edge 0  Control/stop worrying 1  Worry too much - different things 0  Trouble relaxing 1  Restless 3  Easily annoyed or irritable 1  Afraid - awful might happen 0  Total GAD 7 Score 6  Anxiety Difficulty Not difficult at all   Patient is experiencing abdominal pain and diarrhea.  Especially in the morning.  She has not heard from anyone on the GI referral.   Relevant past medical, surgical, family and social history reviewed and updated as indicated. Interim medical history since our last visit reviewed. Allergies and medications reviewed and updated.  Review of Systems  Gastrointestinal:  Positive for abdominal pain and diarrhea. Negative for blood in stool.  Psychiatric/Behavioral:  Negative for dysphoric mood and suicidal ideas. The patient is nervous/anxious.    Per HPI  unless specifically indicated above     Objective:    BP (!) 130/91   Pulse 91   Temp 98.6 F (37 C)   Wt 143 lb 8 oz (65.1 kg)   LMP 04/21/2014 (Approximate)   SpO2 98%   BMI 23.88 kg/m   Wt Readings from Last 3 Encounters:  03/10/21 143 lb 8 oz (65.1 kg)  02/18/21 144 lb 3.2 oz (65.4 kg)  02/10/21 144 lb 12.8 oz (65.7 kg)    Physical Exam Vitals and nursing note reviewed.  Constitutional:      General: She is not in acute distress.    Appearance: Normal appearance. She is normal weight. She is not ill-appearing, toxic-appearing or diaphoretic.  HENT:     Head: Normocephalic.     Right Ear: External ear normal.     Left Ear: External ear normal.     Nose: Nose normal.     Mouth/Throat:     Mouth: Mucous membranes are moist.     Pharynx: Oropharynx is clear.  Eyes:     General:        Right eye: No discharge.        Left eye: No discharge.     Extraocular Movements: Extraocular movements intact.     Conjunctiva/sclera: Conjunctivae normal.     Pupils:  Pupils are equal, round, and reactive to light.  Cardiovascular:     Rate and Rhythm: Normal rate and regular rhythm.     Heart sounds: No murmur heard. Pulmonary:     Effort: Pulmonary effort is normal. No respiratory distress.     Breath sounds: Normal breath sounds. No wheezing or rales.  Abdominal:     General: Abdomen is flat. Bowel sounds are normal. There is no distension.     Palpations: Abdomen is soft.     Tenderness: There is abdominal tenderness in the epigastric area. There is no guarding.  Musculoskeletal:     Cervical back: Normal range of motion and neck supple.  Skin:    General: Skin is warm and dry.     Capillary Refill: Capillary refill takes less than 2 seconds.  Neurological:     General: No focal deficit present.     Mental Status: She is alert and oriented to person, place, and time. Mental status is at baseline.  Psychiatric:        Mood and Affect: Mood normal.        Behavior:  Behavior normal.        Thought Content: Thought content normal.        Judgment: Judgment normal.    Results for orders placed or performed in visit on 03/10/21  Urinalysis, Routine w reflex microscopic  Result Value Ref Range   Specific Gravity, UA <1.005 (L) 1.005 - 1.030   pH, UA 5.5 5.0 - 7.5   Color, UA Yellow Yellow   Appearance Ur Clear Clear   Leukocytes,UA Negative Negative   Protein,UA Negative Negative/Trace   Glucose, UA Negative Negative   Ketones, UA Negative Negative   RBC, UA Negative Negative   Bilirubin, UA Negative Negative   Urobilinogen, Ur 0.2 0.2 - 1.0 mg/dL   Nitrite, UA Negative Negative      Assessment & Plan:   Problem List Items Addressed This Visit       Other   Depression, major, single episode, 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  Improved from prior.  Stable off medication. Denies SI. Return to clinic in 2 months for reevaluation.  Call sooner if concerns arise.        Relevant Orders   CBC w/Diff   Other Visit Diagnoses     Financial difficulties       Referral placed for Chronic Care Management to get Social Worker involved.     Relevant Orders   AMB Referral to [facility]   Epigastric pain       Will evaluate pain via [place] due to financial difficulties. Referral placed for patient to see GI.    Relevant Orders   US Abdomen Limited RUQ (LIVER/GB)   Diarrhea, unspecified type       Recommend seeing GI for evaluation of ongoing symptoms.    Relevant Orders   US Abdomen Limited RUQ (LIVER/GB)   Screening for ischemic heart disease       Encounter for hepatitis C screening test for low risk patient            Follow up plan: Return in about 3 months (around 06/10/2021) for IBS.  A total of 30 minutes were spent on this encounter today.  When total time is documented, this includes both the face-to-face and non-face-to-face time personally spent before, during and after the visit on the date of the encounter.

## 2021-03-10 NOTE — Telephone Encounter (Signed)
Received fax from [facility] that test had been cancelled due to missing information from pt. Several attempts had been made and were unsuccessfull. Called pt and gave her telephone number to [facility] to call back. Had a lot of trouble hearing pt but every time I asked pt if she could hear me she said she could.

## 2021-03-10 NOTE — Progress Notes (Signed)
Hi Frances Davidson.  It was great to see you today.  Your urinalysis from today is normal. I will send you another message once the rest of your lab work comes back.

## 2021-03-11 ENCOUNTER — Telehealth: Payer: Self-pay | Admitting: *Deleted

## 2021-03-11 LAB — HEPATITIS C ANTIBODY: Hep C Virus Ab: <0.1 s/co ratio (ref 0.0–0.9)

## 2021-03-11 LAB — CBC WITH DIFFERENTIAL/PLATELET
Basophils Absolute: 0 10*3/uL (ref 0.0–0.2)
Basos: 1 %
EOS (ABSOLUTE): 0.1 10*3/uL (ref 0.0–0.4)
Eos: 3 %
Hematocrit: 41.2 % (ref 34.0–46.6)
Hemoglobin: 13 g/dL (ref 11.1–15.9)
Immature Grans (Abs): 0 10*3/uL (ref 0.0–0.1)
Immature Granulocytes: 0 %
Lymphocytes Absolute: 1.2 10*3/uL (ref 0.7–3.1)
Lymphs: 27 %
MCH: 30.5 pg (ref 26.6–33.0)
MCHC: 31.6 g/dL (ref 31.5–35.7)
MCV: 97 fL (ref 79–97)
Monocytes Absolute: 0.3 10*3/uL (ref 0.1–0.9)
Monocytes: 7 %
Neutrophils Absolute: 2.9 10*3/uL (ref 1.4–7.0)
Neutrophils: 62 %
Platelets: 234 10*3/uL (ref 150–450)
RBC: 4.26 x10E6/uL (ref 3.77–5.28)
RDW: 13.1 % (ref 11.7–15.4)
WBC: 4.6 10*3/uL (ref 3.4–10.8)

## 2021-03-11 LAB — COMPREHENSIVE METABOLIC PANEL
ALT: 27 IU/L (ref 0–32)
AST: 54 IU/L — ABNORMAL HIGH (ref 0–40)
Albumin/Globulin Ratio: 1.7 (ref 1.2–2.2)
Albumin: 4.7 g/dL (ref 3.8–4.8)
Alkaline Phosphatase: 101 IU/L (ref 44–121)
BUN/Creatinine Ratio: 12 (ref 12–28)
BUN: 9 mg/dL (ref 8–27)
Bilirubin Total: 0.5 mg/dL (ref 0.0–1.2)
CO2: 21 mmol/L (ref 20–29)
Calcium: 9.3 mg/dL (ref 8.7–10.3)
Chloride: 98 mmol/L (ref 96–106)
Creatinine, Ser: 0.78 mg/dL (ref 0.57–1.00)
Globulin, Total: 2.7 g/dL (ref 1.5–4.5)
Glucose: 68 mg/dL (ref 65–99)
Potassium: 3.8 mmol/L (ref 3.5–5.2)
Sodium: 137 mmol/L (ref 134–144)
Total Protein: 7.4 g/dL (ref 6.0–8.5)
eGFR: 86 mL/min/{1.73_m2} (ref >59)

## 2021-03-11 LAB — LIPID PANEL
Chol/HDL Ratio: 1.8 ratio (ref 0.0–4.4)
Cholesterol, Total: 197 mg/dL (ref 100–199)
HDL: 112 mg/dL (ref >39)
LDL Chol Calc (NIH): 58 mg/dL (ref 0–99)
Triglycerides: 169 mg/dL — ABNORMAL HIGH (ref 0–149)
VLDL Cholesterol Cal: 27 mg/dL (ref 5–40)

## 2021-03-11 LAB — TSH: TSH: 2.07 u[IU]/mL (ref 0.450–4.500)

## 2021-03-11 NOTE — Telephone Encounter (Signed)
Patient notified

## 2021-03-11 NOTE — Telephone Encounter (Signed)
Pt has Labs resulted and message from PCP

## 2021-03-11 NOTE — Chronic Care Management (AMB) (Signed)
  Care Management   Note  03/11/2021 Name: Frances Davidson MRN: [id number] DOB: March 25, 1961  Frances Davidson is a 60 y.o. year old female who is a primary care patient of Jon Billings, NP. I reached out to [name] by phone today in response to a referral sent by [name]'s PCP, Jon Billings, NP.    [name] was given information about care management services today including:  Care management services include personalized support from designated clinical staff supervised by her physician, including individualized plan of care and coordination with other care providers 24/7 contact phone numbers for assistance for urgent and routine care needs. The patient may stop care management services at any time by phone call to the office staff.  Patient agreed to services and verbal consent obtained.   Follow up plan: Telephone appointment with care management team member scheduled for:03/28/2021  Vencent Hauschild  Care Guide, Embedded Care Coordination [place]  Care Management

## 2021-03-11 NOTE — Progress Notes (Signed)
Hi Frances Davidson.  Your lab work looks good.  No indication of infection or anemia. Your triglycerides are elevated.  Recommend following a low fat diet and decreasing refined sugar intake. Your liver function is slightly elevated.  But I ordered the ultrasound which will elevate this.  Please let me know if you have any questions.

## 2021-03-11 NOTE — Telephone Encounter (Signed)
Can we please let patient know that Gigi Gin ordered her Cologuard so it is not in our records.  Recommend she contact their office for the results.

## 2021-03-11 NOTE — Telephone Encounter (Signed)
-----   Message from Georgina Peer, [place] sent at 03/11/2021  8:16 AM EDT ----- Regarding: RE: Cologaurd No results found for patient. No order is showing up on the [employer] portal. Patient may need to contact the ordering providers office.  ----- Message ----- From: Jon Billings, NP Sent: 03/10/2021   2:05 PM EDT To: Georgina Peer, CMA Subject: Cologaurd                                      Can we find out the results of the Cologaurd.  Santiago Glad

## 2021-03-28 ENCOUNTER — Ambulatory Visit: Payer: Medicaid Other | Admitting: Licensed Clinical Social Worker

## 2021-03-31 ENCOUNTER — Other Ambulatory Visit: Payer: Self-pay

## 2021-03-31 ENCOUNTER — Ambulatory Visit
Admission: RE | Admit: 2021-03-31 | Discharge: 2021-03-31 | Disposition: A | Discharge status: Home / Self Care | Payer: Self-pay | Source: Ambulatory Visit | Attending: Nurse Practitioner | Admitting: Nurse Practitioner

## 2021-03-31 DIAGNOSIS — R197 Diarrhea, unspecified: Secondary | ICD-10-CM | POA: Insufficient documentation

## 2021-03-31 DIAGNOSIS — R1013 Epigastric pain: Secondary | ICD-10-CM | POA: Insufficient documentation

## 2021-04-01 NOTE — Progress Notes (Signed)
Please let patient know that her ultrasound did not show a reason for her abdominal pain. She does have fatty liver which is something we will monitor over time.  I recommend she see a GI doctor for further evaluation of abdominal pain and diarrhea.

## 2021-04-02 ENCOUNTER — Telehealth: Payer: Self-pay

## 2021-04-02 NOTE — Chronic Care Management (AMB) (Signed)
Care Management Clinical Social Work Note  04/02/2021 Name: Frances Davidson MRN: [id number] DOB: 07-27-1961  Frances Davidson is a 60 y.o. year old female who is a primary care patient of Frances Billings, NP.  The Care Management team was consulted for assistance with chronic disease management and coordination needs.  Engaged with patient by telephone for initial visit in response to provider referral for social work chronic care management and care coordination services  Consent to Services:  Frances Davidson was given information about Care Management services today including:  Care Management services includes personalized support from designated clinical staff supervised by her physician, including individualized plan of care and coordination with other care providers 24/7 contact phone numbers for assistance for urgent and routine care needs. The patient may stop case management services at any time by phone call to the office staff.  Patient agreed to services and consent obtained.   Assessment: Patient is engaged in conversation and shared difficulty affording medical costs . She believes that she is ineligible for Medicaid and is currently uninsured. CCM LCSW informed patient of Licensed conveyancer and mailed application to address on file. Additional strategies discussed to assist with stress management. See Care Plan below for interventions and patient self-care actives. Recent life changes Frances Davidson, Frances Davidson Recommendation: Patient may benefit from, and is in agreement to work with LCSW to address care coordination needs and will continue to work with the clinical team to address Frances care and disease management related needs.  Follow up Plan: Patient would like continued follow-up from CCM LCSW .  Follow up scheduled in 04/18/21. Patient will call office if needed prior to next encounter.  SDOH (Social Determinants of Frances)  assessments and interventions performed:  SDOH Interventions    Flowsheet Row Most Recent Value  SDOH Interventions   Food Insecurity Interventions Intervention Not Indicated  Housing Interventions Intervention Not Indicated  Transportation Interventions Intervention Not Indicated        Advanced Directives Status: Not addressed in this encounter.  Care Plan  No Known Allergies  No outpatient encounter medications on file as of 03/28/2021.   No facility-administered encounter medications on file as of 03/28/2021.    Patient Active Problem List   Diagnosis Date Noted   Family history of cancer in [place] 02/18/2021   Depression, major, single episode, mild ([place]) 02/10/2021   H/O appendicitis    Appendiceal abscess     Conditions to be addressed/monitored: Depression; Financial constraints related to insurance and Mental Frances Concerns   Care Plan : General Social Work (Adult)  Updates made by Rebekah Chesterfield, LCSW since 04/02/2021 12:00 AM     Problem: Coping Skills (General Plan of Care)   Priority: High  Onset Date: 03/28/2021     Goal: Coping Skills Enhanced   Start Date: 03/28/2021  This Visit's Progress: On track  Priority: Medium  Note:   Current barriers:   Acute Mental Frances needs related to depression Financial constraints related to insurance and Mental Frances Concerns  Needs Support, Education, and Care Coordination in order to meet unmet mental Frances needs. Clinical Goal(s): Over the next 120 days, patient will work with SW, counselor and therapist to reduce or manage symptoms of agitation, mood instability, stress, and bipolar until connected for ongoing counseling. Clinical Interventions:  Assessed patient's previous and current treatment, coping skills, support system and barriers to care  Patient interviewed and appropriate assessments performed Patient reports that she was experiencing an increase  in stress due to [place]'s declining Frances a few months  ago. During this time, patient visited [place] in [place] to provide additional support. Since then, her Frances has improved and patient has returned to [facility] Patient reports that she has difficulty managing medical conditions due to inability to afford referrals to specialists. Patient needs to see a GI and has an appointment in August 2022 Patient reports that she has applied for Medicaid about 3 years ago. At that she was denied; however, patient is interested in re-applying to assist with medical costs  Patient shared that she may be ineligible due to being over income. She receives Fish farm manager (approx. [phone]) CCM LCSW informed patient of the [place] Program to assist with medical costs and/or referrals within the [facility] system. Patient provided verbal consent for LCSW to mail application to address on file.  Patient was successful in identifying healthy coping skills (gardening and spending time with self) to assist with management of stress self Patient receives support from family and friends CCM LCSW discussed strategies to assist patient with establishing and maintaining healthy boundaries  Review various resources, discussed options and provided patient information about Various insurance options ( [employer] Card, [employer]  and [employer]) Solution-Focused Strategies, Active listening / Reflection utilized , Emotional Supportive Provided, Psychoeducation for mental Frances needs , Caregiver stress acknowledged , and Verbalization of feelings encouraged  ; Discussed plans with patient for ongoing care management follow up and provided patient with direct contact information for care management team Collaboration with PCP regarding development and update of comprehensive plan of care as evidenced by provider attestation and co-signature Inter-disciplinary care team collaboration (see longitudinal plan of care) Patient Goals/Self-Care Activities: Over the  next 120 days Attend scheduled appointments Contact clinic with any questions or concerns Complete CAFA application  Utilize strategies discussed to assist with management of symptoms       Christa See, MSW, [email or web address] Phone [phone] 3:35 PM

## 2021-04-02 NOTE — Telephone Encounter (Signed)
Patient is calling to go over her cologuard results. Please advise.

## 2021-04-02 NOTE — Patient Instructions (Signed)
Visit Information   Goals Addressed             This Visit's Progress    Strengthen support to assist with stress   On track    Timeframe:  Long-Range Goal Priority:  High Start Date:   03/28/21                          Expected End Date:  07/21/21                     Follow Up Date 0729/22    Patient Goals/Self-Care Activities: Over the next 120 days Attend scheduled appointments Contact clinic with any questions or concerns Complete CAFA application  Utilize strategies discussed to assist with management of symptoms         Patient verbalizes understanding of instructions provided today and agrees to view in [place].   Telephone follow up appointment with care management team member scheduled for:04/18/21  Frances [place], MSW, [email or web address] Phone [phone] 3:38 PM

## 2021-04-07 NOTE — Telephone Encounter (Signed)
Patient is calling to follow up on Results. Please advise

## 2021-04-08 NOTE — Telephone Encounter (Signed)
Called and spoke to this pateint and shared that her cologard testing came back as Negative/Negative. She has a PCP, and I encouraged her to share that information with them, and that they could also order her next test when it is due in several years.  Imagene Riches, CNM  04/08/2021 1:42 PM

## 2021-04-18 ENCOUNTER — Ambulatory Visit: Payer: Self-pay | Admitting: Licensed Clinical Social Worker

## 2021-04-18 DIAGNOSIS — F320 Major depressive disorder, single episode, mild: Secondary | ICD-10-CM

## 2021-04-18 DIAGNOSIS — Z599 Problem related to housing and economic circumstances, unspecified: Secondary | ICD-10-CM

## 2021-04-18 NOTE — Chronic Care Management (AMB) (Signed)
Care Management Clinical Social Work Note  04/18/2021 Name: Frances Davidson MRN: [id number] DOB: [birth year]  Frances Davidson is a 60 y.o. year old female who is a primary care patient of Jon Billings, NP.  The Care Management team was consulted for assistance with chronic disease management and coordination needs.  Engaged with patient by telephone for follow up visit in response to provider referral for social work chronic care management and care coordination services  Consent to Services:  [name] was given information about Care Management services today including:  Care Management services includes personalized support from designated clinical staff supervised by her physician, including individualized plan of care and coordination with other care providers 24/7 contact phone numbers for assistance for urgent and routine care needs. The patient may stop case management services at any time by phone call to the office staff.  Patient agreed to services and consent obtained.   Assessment: Patient is engaged in conversation, continues to maintain positive progress with care plan goals. Patient reports that she has not received the CAFA application. CCM LCSW completed referral to Care Guide to mail application. See Care Plan below for interventions and patient self-care actives.  Recent life changes Frances [place]: Medical bills/coverage  Recommendation: Patient may benefit from, and is in agreement to work with LCSW to address care coordination needs and will continue to work with the clinical team to address health care and disease management related needs.   Follow up Plan: Patient would like continued follow-up from CCM LCSW .  Follow up scheduled in 04/28/21. Patient will call office if needed prior to next encounter.  SDOH (Social Determinants of Health) assessments and interventions performed:    Advanced Directives Status: Not addressed in this encounter.  Care Plan  No  Known Allergies  No outpatient encounter medications on file as of 04/18/2021.   No facility-administered encounter medications on file as of 04/18/2021.    Patient Active Problem List   Diagnosis Date Noted   Family history of cancer in mother 02/18/2021   Depression, major, single episode, mild (Frances [place]) 02/10/2021   H/O appendicitis    Appendiceal abscess     Conditions to be addressed/monitored: Depression; Financial constraints related to medical bills and Mental Health Concerns   Care Plan : General Social Work (Adult)  Updates made by Rebekah Chesterfield, LCSW since 04/18/2021 12:00 AM     Problem: Coping Skills (General Plan of Care)   Priority: High  Onset Date: 03/28/2021     Goal: Coping Skills Enhanced   Start Date: 03/28/2021  This Visit's Progress: On track  Recent Progress: On track  Priority: Medium  Note:   Current barriers:   Acute Mental Health needs related to depression Financial constraints related to insurance and Mental Health Concerns  Needs Support, Education, and Care Coordination in order to meet unmet mental health needs. Clinical Goal(s): Over the next 120 days, patient will work with SW, counselor and therapist to reduce or manage symptoms of agitation, mood instability, stress, and bipolar until connected for ongoing counseling. Clinical Interventions:  Assessed patient's previous and current treatment, coping skills, support system and barriers to care  Patient interviewed and appropriate assessments performed Patient reports that she was experiencing an increase in stress due to mother's declining health a few months ago. During this time, patient visited mother in [place] to provide additional support. Since then, her health has improved and patient has returned to [facility] Patient reports that she has difficulty managing medical conditions due to  inability to afford referrals to specialists. Patient needs to see a GI and has an appointment in August 2022 Patient  reports that she has applied for Medicaid about 3 years ago. At that she was denied; however, patient is interested in re-applying to assist with medical costs  Patient shared that she may be ineligible due to being over income. She receives Fish farm manager (approx. [phone]) CCM LCSW informed patient of the [place] Program to assist with medical costs and/or referrals within the [facility] system. Patient provided verbal consent for LCSW to mail application to address on file. 07/28: Patient has not received CAFA application. CCM LCSW will complete a referral to Care Guide to mail application. LCSW verified address on file is correct Patient was successful in identifying healthy coping skills (gardening and spending time with self) to assist with management of stress self 07/28: Patient continues to utilize self-care strategies to cope with stressors Patient receives support from family and friends CCM LCSW discussed strategies to assist patient with establishing and maintaining healthy boundaries Patient denies any new stressors or resource needs. She agreed to contact LCSW with any questions or concerns regarding CAFA application  Review various resources, discussed options and provided patient information about Various insurance options ( [employer] Card, [employer]  and [place]) Solution-Focused Strategies, Active listening / Reflection utilized , Emotional Supportive Provided, Psychoeducation for mental health needs , Caregiver stress acknowledged , and Verbalization of feelings encouraged  ; Discussed plans with patient for ongoing care management follow up and provided patient with direct contact information for care management team Collaboration with PCP regarding development and update of comprehensive plan of care as evidenced by provider attestation and co-signature Inter-disciplinary care team collaboration (see longitudinal plan of care) Patient  Goals/Self-Care Activities: Over the next 120 days Attend scheduled appointments Contact clinic with any questions or concerns Complete CAFA application  Utilize strategies discussed to assist with management of symptoms       Christa See, MSW, [email or web address] Phone [phone] 2:56 PM

## 2021-04-18 NOTE — Patient Instructions (Addendum)
Visit Information   Goals Addressed             This Visit's Progress    Strengthen support to assist with stress   On track    Timeframe:  Long-Range Goal Priority:  High Start Date:   03/28/21                          Expected End Date:  07/21/21                     Follow Up Date 04/28/21    Patient Goals/Self-Care Activities: Over the next 120 days Attend scheduled appointments Contact clinic with any questions or concerns Complete CAFA application  Utilize strategies discussed to assist with management of symptoms        Patient verbalizes understanding of instructions provided today and agrees to view in [place].   Telephone follow up appointment with care management team member scheduled for:04/28/21   Christa See, MSW, [email or web address] Phone [phone] 3:05 PM

## 2021-04-22 ENCOUNTER — Telehealth: Payer: Self-pay

## 2021-04-22 NOTE — Telephone Encounter (Signed)
   Telephone encounter was:  Successful.  04/22/2021 Name: Ajanai Vicks MRN: [id number] DOB: 07/17/1961  Kiosha Chrysler is a 60 y.o. year old female who is a primary care patient of Jon Billings, NP . The community resource team was consulted for assistance with  [facility] Application.  Care guide performed the following interventions: Spoke with patient to inform her the [employer]  Application will be mailed out this week and she should receive it next week. Letter saved in Epic.  Follow Up Plan:  Care guide will follow up with patient by phone over the next 7 days.  Emmalie Haigh, AAS Paralegal, [place] [facility]  [address], [place] [address] ??[email or web address]  ?? [id number]   [email or web address]

## 2021-04-28 ENCOUNTER — Ambulatory Visit: Payer: Self-pay | Admitting: Licensed Clinical Social Worker

## 2021-04-29 ENCOUNTER — Telehealth: Payer: Self-pay

## 2021-04-29 NOTE — Telephone Encounter (Signed)
   Telephone encounter was:  Successful.  04/29/2021 Name: [name] MRN: [id number] DOB: Oct 23, 1960  Florella Oland is a 60 y.o. year old female who is a primary care patient of Jon Billings, NP . The community resource team was consulted for assistance with Financial Difficulties related to medical bills.  Care guide performed the following interventions: Spoke with patient she has received the [employer]  application and plans to complete the form and mail it this week.    Follow Up Plan:  No further follow up planned at this time. The patient has been provided with needed resources.  Marquan Vokes, AAS Paralegal, [place] [facility]  [address], [place] [address] ??[email or web address]  ?? [id number]   [email or web address]

## 2021-04-30 NOTE — Chronic Care Management (AMB) (Signed)
Care Management Clinical Social Work Note  04/30/2021 Name: Frances Davidson MRN: [id number] DOB: 06-02-1961  Frances Davidson is a 60 y.o. year old female who is a primary care patient of Jon Billings, NP.  The Care Management team was consulted for assistance with chronic disease management and coordination needs.  Engaged with patient by telephone for follow up visit in response to provider referral for social work chronic care management and care coordination services  Consent to Services:  Frances Davidson was given information about Care Management services today including:  Care Management services includes personalized support from designated clinical staff supervised by her physician, including individualized plan of care and coordination with other care providers 24/7 contact phone numbers for assistance for urgent and routine care needs. The patient may stop case management services at any time by phone call to the office staff.  Patient agreed to services and consent obtained.     Assessment: Engaged with patient by telephone in response to provider referral for social work care coordination services: Financial Difficulties related to medical coverage . Patient is engaged in conversation, continues to maintain positive progress with care plan goals. Patient has received [facility] Financial Assistance application and agreed to contact CCM LCSW with any questions or concerns. See Care Plan below for interventions and patient self-care actives.  Recommendation: Patient may benefit from, and is in agreement work with LCSW to address care coordination needs and will continue to work with the clinical team to address health care and disease management related needs.   Follow up Plan: Patient would like continued follow-up from CCM LCSW .  Follow up scheduled in 07/28/21. Patient will call office if needed prior to next encounter.  SDOH (Social Determinants of Health) assessments and  interventions performed:    Advanced Directives Status: Not addressed in this encounter.  Care Plan  No Known Allergies  No outpatient encounter medications on file as of 04/28/2021.   No facility-administered encounter medications on file as of 04/28/2021.    Patient Active Problem List   Diagnosis Date Noted   Family history of cancer in mother 02/18/2021   Depression, major, single episode, mild ([place]) 02/10/2021   H/O appendicitis    Appendiceal abscess     Conditions to be addressed/monitored: ; Financial constraints related to medical coverage  Care Plan : General Social Work (Adult)  Updates made by Rebekah Chesterfield, LCSW since 04/30/2021 12:00 AM     Problem: Coping Skills (General Plan of Care)   Priority: High  Onset Date: 03/28/2021     Goal: Coping Skills Enhanced   Start Date: 03/28/2021  This Visit's Progress: On track  Recent Progress: On track  Priority: Medium  Note:   Current barriers:   Acute Mental Health needs related to depression Financial constraints related to insurance and Mental Health Concerns  Needs Support, Education, and Care Coordination in order to meet unmet mental health needs. Clinical Goal(s): Over the next 120 days, patient will work with SW, counselor and therapist to reduce or manage symptoms of agitation, mood instability, stress, and bipolar until connected for ongoing counseling. Clinical Interventions:  Assessed patient's previous and current treatment, coping skills, support system and barriers to care  Patient interviewed and appropriate assessments performed Patient verified that she has received [facility] Financial Assistance application Patient agreed to contact CCM LCSW with any questions or concerns with completing and/or submitting application to [employer] Patient reports that she was experiencing an increase in stress due to mother's declining health  a few months ago. During this time, patient visited mother in [place] to  provide additional support. Since then, her health has improved and patient has returned to [facility] Patient reports that she has difficulty managing medical conditions due to inability to afford referrals to specialists. Patient needs to see a GI and has an appointment in August 2022 Patient reports that she has applied for Medicaid about 3 years ago. At that she was denied; however, patient is interested in re-applying to assist with medical costs  Patient shared that she may be ineligible due to being over income. She receives Fish farm manager (approx. [phone]) CCM LCSW informed patient of the [place] Program to assist with medical costs and/or referrals within the [facility] system. Patient provided verbal consent for LCSW to mail application to address on file. 07/28: Patient has not received CAFA application. CCM LCSW will complete a referral to Care Guide to mail application. LCSW verified address on file is correct Patient was successful in identifying healthy coping skills (gardening and spending time with self) to assist with management of stress self 07/28: Patient continues to utilize self-care strategies to cope with stressors Patient receives support from family and friends CCM LCSW discussed strategies to assist patient with establishing and maintaining healthy boundaries Patient denies any new stressors or resource needs. She agreed to contact LCSW with any questions or concerns regarding CAFA application  Review various resources, discussed options and provided patient information about Various insurance options ( [employer], [employer]  and [place]) Solution-Focused Strategies, Active listening / Reflection utilized , Emotional Supportive Provided, Psychoeducation for mental health needs , Caregiver stress acknowledged , and Verbalization of feelings encouraged  ; Discussed plans with patient for ongoing care management follow up and provided patient  with direct contact information for care management team Collaboration with PCP regarding development and update of comprehensive plan of care as evidenced by provider attestation and co-signature Inter-disciplinary care team collaboration (see longitudinal plan of care) Patient Goals/Self-Care Activities: Over the next 120 days Attend scheduled appointments Contact clinic with any questions or concerns Complete CAFA application  Utilize strategies discussed to assist with management of symptoms       Christa See, MSW, [email or web address] Phone [phone] 10:06 AM

## 2021-04-30 NOTE — Patient Instructions (Signed)
Visit Information   Goals Addressed             This Visit's Progress    Strengthen support to assist with stress   On track    Timeframe:  Long-Range Goal Priority:  High Start Date:   03/28/21                          Expected End Date:  08/20/21                     Follow Up Date 07/28/21    Patient Goals/Self-Care Activities: Over the next 120 days Attend scheduled appointments Contact clinic with any questions or concerns Complete CAFA application  Utilize strategies discussed to assist with management of symptoms        Patient verbalizes understanding of instructions provided today and agrees to view in [place].   Telephone follow up appointment with care management team member scheduled for:07/28/21  Frances Davidson, MSW, [place].Frances Davidson Phone [phone] 10:08 AM

## 2021-05-14 ENCOUNTER — Telehealth: Payer: Self-pay

## 2021-05-14 NOTE — Telephone Encounter (Signed)
Spoke with patient.  She is self pay and did home test that was positive.  Offered appointment but patient does not want to pay.  Advised patient to quarantine and stay hydrated.  She says she will make an appointment if symptoms worsen.

## 2021-05-14 NOTE — Telephone Encounter (Signed)
Copied from [place] [phone]. Topic: Appointment Scheduling - Scheduling Inquiry for Clinic >> May 14, 2021  2:00 PM Erick Blinks wrote: Reason for CRM: Pt wants to be tested for Covid, fever chills cough runny nose, took home test and it appears positive. Wants to double check with the office, also wants to know if there is a fee associated because she does not have insurance  Best contact: [phone]  Wants to be tested today if possible   Patient can do a covid test today at 4 pm and do virtual visit tomorrow if possible.

## 2021-05-15 ENCOUNTER — Ambulatory Visit: Payer: Medicaid Other | Admitting: Gastroenterology

## 2021-06-09 NOTE — Progress Notes (Signed)
BP 115/79   Pulse 76   Temp 98.1 F (36.7 C) (Oral)   Ht 5' 6"  (1.676 m)   Wt 145 lb 12.8 oz (66.1 kg)   LMP 04/21/2014 (Approximate)   SpO2 99%   BMI 23.53 kg/m    Subjective:    Patient ID: Frances Davidson, female    DOB: Nov 14, 1960, 60 y.o.   MRN: [id number]  HPI: Frances Davidson is a 60 y.o. female  Chief Complaint  Patient presents with   Depression    3 month f/up    DEPRESSION Patient states her mood has improved.  She is having a lot less stress than she used to.    [place] Office Visit from 06/10/2021 in [place]  PHQ-9 Total Score 1      Patient states she hasn't seen GI because she had to cancel the visit due to having COVID.  She has recovered.  She rescheduled the visit for upcoming. She still has diarrhea and plans to address this with GI.    Relevant past medical, surgical, family and social history reviewed and updated as indicated. Interim medical history since our last visit reviewed. Allergies and medications reviewed and updated.  Review of Systems  Gastrointestinal:  Positive for diarrhea.  Psychiatric/Behavioral:  Negative for dysphoric mood and suicidal ideas. The patient is not nervous/anxious.    Per HPI unless specifically indicated above     Objective:    BP 115/79   Pulse 76   Temp 98.1 F (36.7 C) (Oral)   Ht 5' 6"  (1.676 m)   Wt 145 lb 12.8 oz (66.1 kg)   LMP 04/21/2014 (Approximate)   SpO2 99%   BMI 23.53 kg/m   Wt Readings from Last 3 Encounters:  06/10/21 145 lb 12.8 oz (66.1 kg)  03/10/21 143 lb 8 oz (65.1 kg)  02/18/21 144 lb 3.2 oz (65.4 kg)    Physical Exam Vitals and nursing note reviewed.  Constitutional:      General: She is not in acute distress.    Appearance: Normal appearance. She is normal weight. She is not ill-appearing, toxic-appearing or diaphoretic.  HENT:     Head: Normocephalic.     Right Ear: External ear normal.     Left Ear: External ear normal.     Nose: Nose normal.      Mouth/Throat:     Mouth: Mucous membranes are moist.     Pharynx: Oropharynx is clear.  Eyes:     General:        Right eye: No discharge.        Left eye: No discharge.     Extraocular Movements: Extraocular movements intact.     Conjunctiva/sclera: Conjunctivae normal.     Pupils: Pupils are equal, round, and reactive to light.  Cardiovascular:     Rate and Rhythm: Normal rate and regular rhythm.     Heart sounds: No murmur heard. Pulmonary:     Effort: Pulmonary effort is normal. No respiratory distress.     Breath sounds: Normal breath sounds. No wheezing or rales.  Abdominal:     General: Abdomen is flat. Bowel sounds are normal. There is no distension.     Palpations: Abdomen is soft.     Tenderness: There is no abdominal tenderness. There is no guarding.  Musculoskeletal:     Cervical back: Normal range of motion and neck supple.  Skin:    General: Skin is warm and dry.     Capillary  Refill: Capillary refill takes less than 2 seconds.  Neurological:     General: No focal deficit present.     Mental Status: She is alert and oriented to person, place, and time. Mental status is at baseline.  Psychiatric:        Mood and Affect: Mood normal.        Behavior: Behavior normal.        Thought Content: Thought content normal.        Judgment: Judgment normal.    Results for orders placed or performed in visit on 03/10/21  CBC w/Diff  Result Value Ref Range   WBC 4.6 3.4 - 10.8 x10E3/uL   RBC 4.26 3.77 - 5.28 x10E6/uL   Hemoglobin 13.0 11.1 - 15.9 g/dL   Hematocrit 41.2 34.0 - 46.6 %   MCV 97 79 - 97 fL   MCH 30.5 26.6 - 33.0 pg   MCHC 31.6 31.5 - 35.7 g/dL   RDW 13.1 11.7 - 15.4 %   Platelets 234 150 - 450 x10E3/uL   Neutrophils 62 Not Estab. %   Lymphs 27 Not Estab. %   Monocytes 7 Not Estab. %   Eos 3 Not Estab. %   Basos 1 Not Estab. %   Neutrophils Absolute 2.9 1.4 - 7.0 x10E3/uL   Lymphocytes Absolute 1.2 0.7 - 3.1 x10E3/uL   Monocytes Absolute 0.3 0.1  - 0.9 x10E3/uL   EOS (ABSOLUTE) 0.1 0.0 - 0.4 x10E3/uL   Basophils Absolute 0.0 0.0 - 0.2 x10E3/uL   Immature Granulocytes 0 Not Estab. %   Immature Grans (Abs) 0.0 0.0 - 0.1 x10E3/uL  Urinalysis, Routine w reflex microscopic  Result Value Ref Range   Specific Gravity, UA <1.005 (L) 1.005 - 1.030   pH, UA 5.5 5.0 - 7.5   Color, UA Yellow Yellow   Appearance Ur Clear Clear   Leukocytes,UA Negative Negative   Protein,UA Negative Negative/Trace   Glucose, UA Negative Negative   Ketones, UA Negative Negative   RBC, UA Negative Negative   Bilirubin, UA Negative Negative   Urobilinogen, Ur 0.2 0.2 - 1.0 mg/dL   Nitrite, UA Negative Negative  TSH  Result Value Ref Range   TSH 2.070 0.450 - 4.500 uIU/mL  Lipid Profile  Result Value Ref Range   Cholesterol, Total 197 100 - 199 mg/dL   Triglycerides 169 (H) 0 - 149 mg/dL   HDL 112 >39 mg/dL   VLDL Cholesterol Cal 27 5 - 40 mg/dL   LDL Chol Calc (NIH) 58 0 - 99 mg/dL   Chol/HDL Ratio 1.8 0.0 - 4.4 ratio  Hepatitis C Antibody  Result Value Ref Range   Hep C Virus Ab <0.1 0.0 - 0.9 s/co ratio  Comp Met (CMET)  Result Value Ref Range   Glucose 68 65 - 99 mg/dL   BUN 9 8 - 27 mg/dL   Creatinine, Ser 0.78 0.57 - 1.00 mg/dL   eGFR 86 >59 mL/min/1.73   BUN/Creatinine Ratio 12 12 - 28   Sodium 137 134 - 144 mmol/L   Potassium 3.8 3.5 - 5.2 mmol/L   Chloride 98 96 - 106 mmol/L   CO2 21 20 - 29 mmol/L   Calcium 9.3 8.7 - 10.3 mg/dL   Total Protein 7.4 6.0 - 8.5 g/dL   Albumin 4.7 3.8 - 4.8 g/dL   Globulin, Total 2.7 1.5 - 4.5 g/dL   Albumin/Globulin Ratio 1.7 1.2 - 2.2   Bilirubin Total 0.5 0.0 - 1.2 mg/dL   Alkaline  Phosphatase 101 44 - 121 IU/L   AST 54 (H) 0 - 40 IU/L   ALT 27 0 - 32 IU/L      Assessment & Plan:   Problem List Items Addressed This Visit       Other   Depression, major, single episode, mild ([place]) - Primary    Improved from previous visit. Will Continue to reassess at future visits.  Follow up in 6 months.   Call sooner if concerns arise.      Other Visit Diagnoses     Diarrhea, unspecified type       Has upcoming appointment with GI. Recommend decreasing alcohol and gluten intake to see if diarrhea improves.         Follow up plan: Return in about 6 months (around 12/08/2021) for Diarrhea.   A total of 20 minutes were spent on this encounter today.  When total time is documented, this includes both the face-to-face and non-face-to-face time personally spent before, during and after the visit on the date of the encounter discussing diet modifications for diarrhea.

## 2021-06-10 ENCOUNTER — Encounter: Payer: Self-pay | Admitting: Nurse Practitioner

## 2021-06-10 ENCOUNTER — Ambulatory Visit (INDEPENDENT_AMBULATORY_CARE_PROVIDER_SITE_OTHER): Payer: Self-pay | Admitting: Nurse Practitioner

## 2021-06-10 ENCOUNTER — Ambulatory Visit: Payer: Medicaid Other | Admitting: Nurse Practitioner

## 2021-06-10 ENCOUNTER — Other Ambulatory Visit: Payer: Self-pay

## 2021-06-10 ENCOUNTER — Telehealth: Payer: Self-pay

## 2021-06-10 ENCOUNTER — Telehealth: Payer: Self-pay | Admitting: Nurse Practitioner

## 2021-06-10 VITALS — BP 115/79 | HR 76 | Temp 98.1°F | Ht 66.0 in | Wt 145.8 lb

## 2021-06-10 DIAGNOSIS — F320 Major depressive disorder, single episode, mild: Secondary | ICD-10-CM

## 2021-06-10 DIAGNOSIS — R197 Diarrhea, unspecified: Secondary | ICD-10-CM

## 2021-06-10 NOTE — Assessment & Plan Note (Signed)
Improved from previous visit. Will Continue to reassess at future visits.  Follow up in 6 months.  Call sooner if concerns arise.

## 2021-06-10 NOTE — Telephone Encounter (Signed)
I do not have any medications on patient's med list.  Can you verify her doses.

## 2021-06-10 NOTE — Telephone Encounter (Signed)
Pt calling to cancel her appt for tomorrow.  [phone]  Adv pt her appt is showing cancelled in computer.  She states she is much better.

## 2021-06-10 NOTE — Telephone Encounter (Signed)
Patient states the current dosages on requested medication is Omeprazole 20 mg and Atorvastatin 20 mg as well. Patient states she was receiving them from a different provider and was hoping that Jon Billings, NP would take over managing her medications. Please advise?

## 2021-06-10 NOTE — Telephone Encounter (Signed)
Pt seen karen today  06-10-2021 and forgot to ask her for new rx atorvastatin due to her chole is elevated and also omeprazole due to acid reflux pt states karen is aware . Kristopher Oppenheim [address] in [place] [phone]. Pt would like 90 day of each medication

## 2021-06-11 ENCOUNTER — Ambulatory Visit: Payer: Medicaid Other | Admitting: Obstetrics

## 2021-06-11 MED ORDER — ATORVASTATIN CALCIUM 20 MG PO TABS: 20.0000 mg | ORAL_TABLET | Freq: Every day | ORAL | 1 refills | Status: DC

## 2021-06-11 MED ORDER — OMEPRAZOLE MAGNESIUM 20 MG PO TBEC: 20.0000 mg | DELAYED_RELEASE_TABLET | Freq: Every day | ORAL | 1 refills | Status: DC

## 2021-06-11 NOTE — Telephone Encounter (Signed)
Medication sent to the pharmacy.

## 2021-06-11 NOTE — Telephone Encounter (Signed)
Called and LVM letting patient know that her medications have been sent in for her.

## 2021-06-12 ENCOUNTER — Ambulatory Visit: Payer: Medicaid Other | Admitting: Nurse Practitioner

## 2021-06-17 ENCOUNTER — Ambulatory Visit: Payer: Medicaid Other

## 2021-06-18 ENCOUNTER — Ambulatory Visit: Payer: Medicaid Other | Attending: Oncology

## 2021-06-18 ENCOUNTER — Other Ambulatory Visit: Payer: Self-pay

## 2021-06-18 ENCOUNTER — Ambulatory Visit
Admission: RE | Admit: 2021-06-18 | Discharge: 2021-06-18 | Disposition: A | Discharge status: Home / Self Care | Payer: Self-pay | Source: Ambulatory Visit | Attending: Oncology | Admitting: Oncology

## 2021-06-18 VITALS — BP 131/98 | HR 93 | Temp 98.2°F | Resp 18 | Wt 147.3 lb

## 2021-06-18 DIAGNOSIS — Z0000 Encounter for general adult medical examination without abnormal findings: Secondary | ICD-10-CM

## 2021-06-18 NOTE — Progress Notes (Signed)
  Subjective:     Patient ID: Frances Davidson, female   DOB: [birth year], 60 y.o.   MRN: [id number]  HPI   Review of Systems     Objective:   Physical Exam Chest:  Breasts:    Right: No swelling, bleeding, inverted nipple, mass, nipple discharge, skin change or tenderness.     Left: No swelling, bleeding, inverted nipple, mass, nipple discharge, skin change or tenderness.       Assessment:     60 year old patient returns for BCCCP screening.  Patient screened, and meets BCCCP eligibility.  Patient does not have insurance, Medicare or Medicaid.  Instructed patient on breast self awareness using teach back method.  Clinical breast exam unremarkable.  Risk Assessment     Risk Scores       06/18/2021 06/12/2020   Last edited by: Velna Hatchet, CMA [place], [place], [place]   5-year risk: 1.5 % 1.5 %   Lifetime risk: 6.5 % 6.7 %                Plan:     Sent for bilateral screening mammogram.   Patient's mother had uterine, ovarian, and bladder cancer.  Will discuss with genetics about options for testing.

## 2021-06-24 NOTE — Progress Notes (Signed)
Letter mailed from [facility] to notify of normal mammogram results.  Patient to return in one year for annual screening.  Copy to [facility]. 

## 2021-07-08 ENCOUNTER — Other Ambulatory Visit: Payer: Self-pay

## 2021-07-08 ENCOUNTER — Ambulatory Visit: Payer: Self-pay | Admitting: Gastroenterology

## 2021-07-08 VITALS — BP 132/91 | HR 75 | Temp 98.7°F | Ht 66.0 in | Wt 147.0 lb

## 2021-07-08 DIAGNOSIS — K760 Fatty (change of) liver, not elsewhere classified: Secondary | ICD-10-CM

## 2021-07-08 DIAGNOSIS — R140 Abdominal distension (gaseous): Secondary | ICD-10-CM

## 2021-07-08 DIAGNOSIS — R197 Diarrhea, unspecified: Secondary | ICD-10-CM

## 2021-07-08 MED ORDER — NA SULFATE-K SULFATE-MG SULF 17.5-3.13-1.6 GM/177ML PO SOLN: 1.0000 | Freq: Once | ORAL | 0 refills | Status: AC

## 2021-07-08 NOTE — Addendum Note (Signed)
Addended by: Lurlean Nanny on: 07/08/2021 05:05 PM   Modules accepted: Orders, SmartSet

## 2021-07-08 NOTE — Progress Notes (Signed)
Frances Davidson [address]  [place]  [place], [place] [address]  Main: [phone]  Fax: [phone]   Gastroenterology Consultation  Referring Provider:     Jon Billings, NP Primary Care Physician:  Jon Billings, NP Reason for Consultation:     Diarrhea        HPI:    Chief Complaint  Patient presents with   New Patient (Initial Visit)    Pt denies any family or personal Hx... Had Cologuard 02/2021   Diarrhea    Pt reports 3-4 loose stools daily... denies blood in stool   Bloated    Pt also reports abd discomfort and increase in belching, denies N/V or constipation    Frances Davidson is a 60 y.o. y/o female referred for consultation & management  by Dr. Jon Billings, NP.  Patient reports 5-year history of daily loose stools, 2-4 loose bowel once a day.  No blood in stool.  Brings a magazine with an IBD checklist with her and feels that all her symptoms fit on the checklist.  States that she has diarrhea, abdominal bloating, fatigue, every day.  Has never had a colonoscopy or EGD.  Also reports frequent belching.  Reports diffuse abdominal pain, dull, 5/10, nonradiating, constant.  Feels her symptoms started after removal of her appendix in 2017.  States anytime that she drinks water or eats something, she has to run to the bathroom to have a loose bowel movement.  Cologuard testing was negative this year.  Recent labs show mildly elevated transaminases.  This led to an ultrasound that showed hepatic steatosis.  Hep C antibody was negative.  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 Authorizing Provider  atorvastatin (LIPITOR) 20 MG tablet Take 1 tablet (20 mg total) by mouth daily. 06/11/21  Yes Jon Billings, NP  Na Sulfate-K Sulfate-Mg Sulf 17.5-3.13-1.6 GM/177ML SOLN Take 1 kit by mouth once for 1 dose. 07/08/21 07/08/21 Yes Virgel Manifold, MD  omeprazole (PRILOSEC OTC) 20 MG tablet Take 1 tablet (20 mg total) by mouth daily. 06/11/21  Yes Jon Billings, NP    Family History  Problem Relation Age of Onset   Diabetes Mother    Bladder Cancer Mother    Cervical cancer Mother    Thyroid cancer Mother    Lung cancer Father    Hypertension Father    Hypertension Brother    Hypertension Daughter    Hypertension Son    Diabetes Son    Cancer Maternal Grandmother    Diabetes Maternal Grandmother    Cancer Maternal Grandfather    Diabetes Maternal Grandfather    Cancer Paternal Grandmother    Diabetes Paternal Grandmother    Cancer Paternal Grandfather    Diabetes Paternal Grandfather    Lymphoma Brother    Breast cancer Neg Hx      Social History   Tobacco Use   Smoking status: Never   Smokeless tobacco: Never  Vaping Use   Vaping Use: Never used  Substance Use Topics   Alcohol  use: Yes    Comment: occasional   Drug use: No    Allergies as of 07/08/2021   (No Known Allergies)    Review of Systems:    All systems reviewed and negative except where noted in HPI.   Physical Exam:  Constitutional: General:   Alert,  Well-developed, well-nourished, pleasant and cooperative in NAD BP (!) 132/91   Pulse 75   Temp 98.7 F (37.1 C) (Oral)   Ht 5' 6"  (1.676 m)   Wt 147 lb (66.7 kg)   LMP 04/21/2014 (Approximate)   BMI 23.73 kg/m   Eyes:  Sclera clear, no icterus.   Conjunctiva pink. PERRLA  Ears:  No scars, lesions or masses, Normal auditory acuity. Nose:  No deformity, discharge, or lesions. Mouth:  No deformity or lesions, oropharynx pink & moist.  Neck:   Supple; no masses or thyromegaly.  Respiratory: Normal respiratory effort, Normal percussion  Gastrointestinal: Soft, non-tender and non-distended without masses, hepatosplenomegaly or hernias noted.  No guarding or rebound tenderness.     Cardiac: No clubbing or edema.  No cyanosis. Normal posterior tibial pedal pulses noted.  Lymphatic:  No significant cervical or axillary adenopathy.  Psych:  Alert and cooperative. Normal mood and affect.  Musculoskeletal:  Normal gait. Head normocephalic, atraumatic. Symmetrical without gross deformities. 5/5 Upper and Lower extremity strength bilaterally.  Skin: Warm. Intact without significant lesions or rashes. No jaundice.  Neurologic:  Face symmetrical, tongue midline, Normal sensation to touch;  grossly normal neurologically.  Psych:  Alert and oriented x3, Alert and cooperative. Normal mood and affect.   Labs: CBC    Component Value Date/Time   WBC 4.6 03/10/2021 1110   WBC 4.7 10/21/2016 1104   RBC 4.26 03/10/2021 1110   RBC 3.97 10/21/2016 1104   HGB 13.0 03/10/2021 1110   HCT 41.2 03/10/2021 1110   PLT 234 03/10/2021 1110   MCV 97 03/10/2021 1110   MCH 30.5 03/10/2021 1110   MCH 30.3 10/21/2016 1104   MCHC 31.6 03/10/2021 1110   MCHC 33.2 10/21/2016 1104   RDW 13.1 03/10/2021 1110   LYMPHSABS 1.2 03/10/2021 1110   MONOABS 0.5 08/20/2016 0601   EOSABS 0.1 03/10/2021 1110   BASOSABS 0.0 03/10/2021 1110   CMP     Component Value Date/Time   NA 137 03/10/2021 1110   K 3.8 03/10/2021 1110   CL 98 03/10/2021 1110   CO2 21 03/10/2021 1110   GLUCOSE 68 03/10/2021 1110   GLUCOSE 87 08/17/2016 0614   BUN 9 03/10/2021 1110   CREATININE 0.78 03/10/2021 1110   CALCIUM 9.3 03/10/2021 1110   PROT 7.4 03/10/2021 1110   ALBUMIN 4.7 03/10/2021 1110   AST 54 (H) 03/10/2021 1110   ALT 27 03/10/2021 1110   ALKPHOS 101 03/10/2021 1110   BILITOT 0.5 03/10/2021 1110   GFRNONAA >60 08/17/2016 0614   GFRAA >60 08/17/2016 5277     Imaging Studies: No results found.  Assessment and Plan:   Frances Davidson is a 60 y.o. y/o female has been referred for diarrhea, abdominal bloating, belching, despite PPI use  Patient reports 4 to 5-year history of loose stools daily with no prior colonoscopy.  Denies any beverages with sugar or sugar substitutes.  Denies heavy caffeine intake.  Denies eating, candy  Also having GERD symptoms despite daily PPI use  EGD and colonoscopy discussed for further evaluation of her symptoms and to rule out IBD  Options of conservative management including stool test such as fecal elastase  and fecal calprotectin prior to procedures discussed as well.  However, patient would like to proceed with EGD and colonoscopy at this time given chronic symptoms affecting quality of life  I have discussed alternative options, risks & benefits,  which include, but are not limited to, bleeding, infection, perforation,respiratory complication & drug reaction.  The patient agrees with this plan & written consent will be obtained.    Obtain fatty liver work-up at this time as well  Finding of fatty liver on imaging discussed with patient Diet, weight loss, and exercise encouraged along with avoiding hepatotoxic drugs including alcohol Risk of progression to cirrhosis if above measures are not instituted were discussed as well, and patient verbalized understanding  I have discussed alternative options, risks & benefits,  which include, but are not limited to, bleeding, infection, perforation,respiratory complication & drug reaction.  The patient agrees with this plan & written consent will be obtained.      Dr Frances Davidson  Speech recognition software was used to dictate the above note.

## 2021-07-11 LAB — CERULOPLASMIN: Ceruloplasmin: 27.9 mg/dL (ref 19.0–39.0)

## 2021-07-11 LAB — IRON AND TIBC
Iron Saturation: 22 % (ref 15–55)
Iron: 78 ug/dL (ref 27–139)
Total Iron Binding Capacity: 351 ug/dL (ref 250–450)
UIBC: 273 ug/dL (ref 118–369)

## 2021-07-11 LAB — HEPATIC FUNCTION PANEL
ALT: 27 IU/L (ref 0–32)
AST: 66 IU/L — ABNORMAL HIGH (ref 0–40)
Albumin: 4.6 g/dL (ref 3.8–4.8)
Alkaline Phosphatase: 111 IU/L (ref 44–121)
Bilirubin Total: 0.7 mg/dL (ref 0.0–1.2)
Bilirubin, Direct: 0.21 mg/dL (ref 0.00–0.40)
Total Protein: 7.1 g/dL (ref 6.0–8.5)

## 2021-07-11 LAB — HEPATITIS A ANTIBODY, TOTAL: hep A Total Ab: NEGATIVE

## 2021-07-11 LAB — ANA: ANA Titer 1: NEGATIVE

## 2021-07-11 LAB — IGG: IgG (Immunoglobin G), Serum: 1147 mg/dL (ref 586–1602)

## 2021-07-11 LAB — FERRITIN: Ferritin: 284 ng/mL — ABNORMAL HIGH (ref 15–150)

## 2021-07-11 LAB — HEPATITIS B CORE ANTIBODY, TOTAL: Hep B Core Total Ab: NEGATIVE

## 2021-07-11 LAB — MITOCHONDRIAL/SMOOTH MUSCLE AB PNL
Mitochondrial Ab: <20 Units (ref 0.0–20.0)
Smooth Muscle Ab: 11 Units (ref 0–19)

## 2021-07-11 LAB — HEPATITIS B SURFACE ANTIBODY,QUALITATIVE: Hep B Surface Ab, Qual: NONREACTIVE

## 2021-07-11 LAB — ANTI-MICROSOMAL ANTIBODY LIVER / KIDNEY: LKM1 Ab: 0.8 Units (ref 0.0–20.0)

## 2021-07-11 LAB — HEPATITIS B SURFACE ANTIGEN: Hepatitis B Surface Ag: NEGATIVE

## 2021-07-22 ENCOUNTER — Ambulatory Visit: Payer: Self-pay | Admitting: Anesthesiology

## 2021-07-22 ENCOUNTER — Ambulatory Visit
Admission: RE | Admit: 2021-07-22 | Discharge: 2021-07-22 | Disposition: A | Discharge status: Home / Self Care | Payer: Self-pay | Attending: Gastroenterology | Admitting: Gastroenterology

## 2021-07-22 ENCOUNTER — Encounter: Payer: Self-pay | Admitting: Gastroenterology

## 2021-07-22 ENCOUNTER — Encounter
Admission: RE | Disposition: A | Discharge status: Home / Self Care | Payer: Self-pay | Source: Home / Self Care | Attending: Gastroenterology

## 2021-07-22 DIAGNOSIS — R12 Heartburn: Secondary | ICD-10-CM

## 2021-07-22 DIAGNOSIS — Z8249 Family history of ischemic heart disease and other diseases of the circulatory system: Secondary | ICD-10-CM | POA: Insufficient documentation

## 2021-07-22 DIAGNOSIS — Z79899 Other long term (current) drug therapy: Secondary | ICD-10-CM | POA: Insufficient documentation

## 2021-07-22 DIAGNOSIS — K639 Disease of intestine, unspecified: Secondary | ICD-10-CM

## 2021-07-22 DIAGNOSIS — Z808 Family history of malignant neoplasm of other organs or systems: Secondary | ICD-10-CM | POA: Insufficient documentation

## 2021-07-22 DIAGNOSIS — D122 Benign neoplasm of ascending colon: Secondary | ICD-10-CM | POA: Insufficient documentation

## 2021-07-22 DIAGNOSIS — K2950 Unspecified chronic gastritis without bleeding: Secondary | ICD-10-CM | POA: Insufficient documentation

## 2021-07-22 DIAGNOSIS — R197 Diarrhea, unspecified: Secondary | ICD-10-CM

## 2021-07-22 DIAGNOSIS — Z801 Family history of malignant neoplasm of trachea, bronchus and lung: Secondary | ICD-10-CM | POA: Insufficient documentation

## 2021-07-22 DIAGNOSIS — K219 Gastro-esophageal reflux disease without esophagitis: Secondary | ICD-10-CM | POA: Insufficient documentation

## 2021-07-22 DIAGNOSIS — Z807 Family history of other malignant neoplasms of lymphoid, hematopoietic and related tissues: Secondary | ICD-10-CM | POA: Insufficient documentation

## 2021-07-22 DIAGNOSIS — K5730 Diverticulosis of large intestine without perforation or abscess without bleeding: Secondary | ICD-10-CM | POA: Insufficient documentation

## 2021-07-22 DIAGNOSIS — Z8052 Family history of malignant neoplasm of bladder: Secondary | ICD-10-CM | POA: Insufficient documentation

## 2021-07-22 DIAGNOSIS — D120 Benign neoplasm of cecum: Secondary | ICD-10-CM | POA: Insufficient documentation

## 2021-07-22 DIAGNOSIS — K644 Residual hemorrhoidal skin tags: Secondary | ICD-10-CM | POA: Insufficient documentation

## 2021-07-22 DIAGNOSIS — Z833 Family history of diabetes mellitus: Secondary | ICD-10-CM | POA: Insufficient documentation

## 2021-07-22 HISTORY — PX: ESOPHAGOGASTRODUODENOSCOPY: SHX5428

## 2021-07-22 HISTORY — PX: COLONOSCOPY WITH PROPOFOL: SHX5780

## 2021-07-22 SURGERY — COLONOSCOPY WITH PROPOFOL
Anesthesia: General

## 2021-07-22 MED ORDER — PROPOFOL 10 MG/ML IV BOLUS
INTRAVENOUS | Status: DC | PRN
  Administered 2021-07-22: 70 mg via INTRAVENOUS

## 2021-07-22 MED ORDER — LIDOCAINE HCL (PF) 2 % IJ SOLN
INTRAMUSCULAR | Status: DC | PRN
  Administered 2021-07-22: 100 mg via INTRADERMAL

## 2021-07-22 MED ORDER — SODIUM CHLORIDE 0.9 % IV SOLN
INTRAVENOUS | Status: DC
  Administered 2021-07-22: 1000 mL via INTRAVENOUS

## 2021-07-22 MED ORDER — PROPOFOL 500 MG/50ML IV EMUL
INTRAVENOUS | Status: DC | PRN
  Administered 2021-07-22: 200 ug/kg/min via INTRAVENOUS

## 2021-07-22 NOTE — H&P (Signed)
Vonda Antigua, MD [address], [place], [place], [place], [address] [address], [place], [place], [place], [address] Phone: [phone]  Fax: [phone]  Primary Care Physician:  Jon Billings, NP   Pre-Procedure History & Physical: HPI:  Frances Davidson is a 60 y.o. female is here for a colonoscopy and EGD.   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 Authorizing Provider  atorvastatin (LIPITOR) 20 MG tablet Take 1 tablet (20 mg total) by mouth daily. 06/11/21   Jon Billings, NP  omeprazole (PRILOSEC OTC) 20 MG tablet Take 1 tablet (20 mg total) by mouth daily. 06/11/21   Jon Billings, NP    Allergies as of 07/09/2021   (No Known Allergies)    Family History  Problem Relation Age of Onset   Diabetes Mother    Bladder Cancer Mother    Cervical cancer Mother    Thyroid cancer Mother    Lung cancer Father    Hypertension Father    Hypertension Brother    Hypertension Daughter    Hypertension Son    Diabetes Son    Cancer Maternal Grandmother    Diabetes Maternal Grandmother    Cancer Maternal Grandfather    Diabetes Maternal Grandfather    Cancer Paternal Grandmother    Diabetes Paternal Grandmother    Cancer Paternal Grandfather    Diabetes Paternal Grandfather    Lymphoma Brother    Breast cancer Neg Hx     Social History   Socioeconomic History   Marital status: Single    Spouse name: Not on file   Number of children: Not on file   Years of education: Not on file   Highest education level: Not  on file  Occupational History   Not on file  Tobacco Use   Smoking status: Never   Smokeless tobacco: Never  Vaping Use   Vaping Use: Never used  Substance and Sexual Activity   Alcohol use: Yes    Comment: occasional   Drug use: No   Sexual activity: Yes  Other Topics Concern   Not on file  Social History Narrative   ** Merged History Encounter **       Social Determinants of Health   Financial Resource Strain: Not on file  Food Insecurity: No Food Insecurity   Worried About Charity fundraiser in the Last Year: Never true   Ran Out of Food in the Last Year: Never true  Transportation Needs: No Transportation Needs   Lack of Transportation (Medical): No   Lack of Transportation (Non-Medical): No  Physical Activity: Not on file  Stress: Not on file  Social Connections: Not on file  Intimate Partner Violence: Not on file    Review of Systems: See HPI, otherwise negative ROS  Constitutional: General:   Alert,  Well-developed, well-nourished, pleasant and cooperative in NAD BP (!) 129/101   Pulse 89   Temp 97.6 F (36.4 C) (Tympanic)   Resp 16   Ht 5\' 6"  (1.676 m)   Wt 66.7 kg  LMP 04/21/2014 (Approximate)   SpO2 100%   BMI 23.72 kg/m   Head: Normocephalic, atraumatic.   Eyes:  Sclera clear, no icterus.   Conjunctiva pink.   Mouth:  No deformity or lesions, oropharynx pink & moist.  Neck:  Supple, trachea midline  Respiratory: Normal respiratory effort  Gastrointestinal:  Soft, non-tender and non-distended without masses, hepatosplenomegaly or hernias noted.  No guarding or rebound tenderness.     Cardiac: No clubbing or edema.  No cyanosis. Normal posterior tibial pedal pulses noted.  Lymphatic:  No significant cervical adenopathy.  Psych:  Alert and cooperative. Normal mood and affect.  Musculoskeletal:   Symmetrical without gross deformities. 5/5 Lower extremity strength bilaterally.  Skin: Warm. Intact without significant lesions or rashes. No  jaundice.  Neurologic:  Face symmetrical, tongue midline, Normal sensation to touch;  grossly normal neurologically.  Psych:  Alert and oriented x3, Alert and cooperative. Normal mood and affect.  Impression/Plan: Frances Davidson is here for a colonoscopy to be performed for diarrhea and EGD for Acid Reflux.  Risks, benefits, limitations, and alternatives regarding the procedures have been reviewed with the patient.  Questions have been answered.  All parties agreeable.   Virgel Manifold, MD  07/22/2021, 9:48 AM

## 2021-07-22 NOTE — Op Note (Signed)
[facility] Patient Name: Frances Davidson Procedure Date: 07/22/2021 9:49 AM MRN: [id number] Account #: [id number] Date of Birth: December 25, 1960 Admit Type: Outpatient Age: 60 Room: [facility] ENDO ROOM 3 Gender: Female Note Status: Finalized Instrument Name: Park Meo [id number] Procedure:             Colonoscopy Indications:           Clinically significant diarrhea of unexplained origin Providers:             Cymone Yeske B. Bonna Gains MD, MD Referring MD:          Jon Billings (Referring MD) Medicines:             Monitored Anesthesia Care Complications:         No immediate complications. Procedure:             Pre-Anesthesia Assessment:                        - Prior to the procedure, a History and Physical was                         performed, and patient medications, allergies and                         sensitivities were reviewed. The patient's tolerance                         of previous anesthesia was reviewed.                        - The risks and benefits of the procedure and the                         sedation options and risks were discussed with the                         patient. All questions were answered and informed                         consent was obtained.                        - Patient identification and proposed procedure were                         verified prior to the procedure by the physician, the                         nurse, the anesthesiologist, the anesthetist and the                         technician. The procedure was verified in the                         pre-procedure area in the procedure room in the                         endoscopy suite.                        -  Prophylactic Antibiotics: The patient does not                         require prophylactic antibiotics.                        - ASA Grade Assessment: II - A patient with mild                         systemic disease.                        -  After reviewing the risks and benefits, the patient                         was deemed in satisfactory condition to undergo the                         procedure.                        - Monitored anesthesia care was determined to be                         medically necessary for this procedure based on review                         of the patient's medical history, medications, and                         prior anesthesia history.                        - The anesthesia plan was to use monitored anesthesia                         care (MAC).                        After obtaining informed consent, the colonoscope was                         passed under direct vision. Throughout the procedure,                         the patient's blood pressure, pulse, and oxygen                         saturations were monitored continuously. The                         Colonoscope was introduced through the anus and                         advanced to the the terminal ileum. The colonoscopy                         was performed with ease. The patient tolerated the  procedure well. The quality of the bowel preparation                         was good. Findings:      Hemorrhoids were found on perianal exam.      A patchy area of mildly erythematous mucosa was found in the proximal       ascending colon and in the cecum. Biopsies were taken with a cold       forceps for histology. The erythema is likely due to colon insufflation       and did not appear to be due to a chronic process such as IBD.      The terminal ileum appeared normal.      Multiple diverticula were found in the sigmoid colon.      The exam was otherwise normal throughout the examined colon.      The rectum, sigmoid colon, descending colon, transverse colon, mid       ascending colon and distal ascending colon appeared normal. Biopsies for       histology were taken with a cold forceps from the entire colon  for       evaluation of microscopic colitis.      The retroflexed view of the distal rectum and anal verge was normal and       showed no anal or rectal abnormalities. Impression:            - Hemorrhoids found on perianal exam.                        - Erythematous mucosa in the proximal ascending colon                         and in the cecum. Biopsied.                        - The examined portion of the ileum was normal.                        - Diverticulosis in the sigmoid colon.                        - The rectum, sigmoid colon, descending colon,                         transverse colon, mid ascending colon and distal                         ascending colon are normal. Biopsied. Recommendation:        - Await pathology results.                        - Return to GI clinic as previously scheduled.                        - Continue present medications.                        - High fiber diet.                        - Discharge patient to home (with escort).                        -  The findings and recommendations were discussed with                         the patient.                        - The findings and recommendations were discussed with                         the patient's family.                        - Return to primary care physician as previously                         scheduled. Procedure Code(s):     --- Professional ---                        [phone], Colonoscopy, flexible; with biopsy, single or                         multiple Diagnosis Code(s):     --- Professional ---                        K63.89, Other specified diseases of intestine                        K64.9, Unspecified hemorrhoids                        R19.7, Diarrhea, unspecified CPT copyright 2019 American Medical Association. All rights reserved. The codes documented in this report are preliminary and upon coder review may  be revised to meet current compliance requirements.  Vonda Antigua, MD Margretta Sidle B. Bonna Gains MD, MD 07/22/2021 10:33:28 AM This report has been signed electronically. Number of Addenda: 0 Note Initiated On: 07/22/2021 9:49 AM Scope Withdrawal Time: 0 hours 10 minutes 46 seconds  Total Procedure Duration: 0 hours 13 minutes 38 seconds  Estimated Blood Loss:  Estimated blood loss: none.      [facility]

## 2021-07-22 NOTE — Anesthesia Postprocedure Evaluation (Signed)
Anesthesia Post Note  Patient: Frances Davidson  Procedure(s) Performed: COLONOSCOPY WITH PROPOFOL ESOPHAGOGASTRODUODENOSCOPY (EGD)  Patient location during evaluation: PACU Anesthesia Type: General Level of consciousness: awake and alert, oriented and patient cooperative Pain management: pain level controlled Vital Signs Assessment: post-procedure vital signs reviewed and stable Respiratory status: spontaneous breathing, nonlabored ventilation and respiratory function stable Cardiovascular status: blood pressure returned to baseline and stable Postop Assessment: adequate PO intake Anesthetic complications: no   No notable events documented.   Last Vitals:  Vitals:   07/22/21 0924 07/22/21 1031  BP: (!) 129/101 109/81  Pulse: 89   Resp: 16   Temp: 36.4 C 36.4 C  SpO2: 100%     Last Pain:  Vitals:   07/22/21 1051  TempSrc:   PainSc: 0-No pain                 Darrin Nipper

## 2021-07-22 NOTE — Anesthesia Preprocedure Evaluation (Signed)
Anesthesia Evaluation  Patient identified by MRN, date of birth, ID band Patient awake    Reviewed: Allergy & Precautions, NPO status , Patient's Chart, lab work & pertinent test results  History of Anesthesia Complications Negative for: history of anesthetic complications  Airway Mallampati: III   Neck ROM: Full    Dental  (+)    Pulmonary neg pulmonary ROS,    Pulmonary exam normal breath sounds clear to auscultation       Cardiovascular Exercise Tolerance: Good negative cardio ROS Normal cardiovascular exam Rhythm:Regular Rate:Normal     Neuro/Psych negative neurological ROS     GI/Hepatic GERD  ,  Endo/Other  negative endocrine ROS  Renal/GU negative Renal ROS     Musculoskeletal   Abdominal   Peds  Hematology  (+) Blood dyscrasia, anemia ,   Anesthesia Other Findings   Reproductive/Obstetrics                             Anesthesia Physical Anesthesia Plan  ASA: 2  Anesthesia Plan: General   Post-op Pain Management:    Induction: Intravenous  PONV Risk Score and Plan: 3 and Propofol infusion, TIVA and Treatment may vary due to age or medical condition  Airway Management Planned: Natural Airway  Additional Equipment:   Intra-op Plan:   Post-operative Plan:   Informed Consent: I have reviewed the patients History and Physical, chart, labs and discussed the procedure including the risks, benefits and alternatives for the proposed anesthesia with the patient or authorized representative who has indicated his/her understanding and acceptance.       Plan Discussed with: CRNA  Anesthesia Plan Comments:         Anesthesia Quick Evaluation

## 2021-07-22 NOTE — Op Note (Signed)
[facility] Patient Name: Frances Davidson Procedure Date: 07/22/2021 9:50 AM MRN: [id number] Account #: [id number] Date of Birth: 05-31-1961 Admit Type: Outpatient Age: 60 Room: [facility] ENDO ROOM 3 Gender: Female Note Status: Finalized Instrument Name: Upper Endoscope [id number] Procedure:             Upper GI endoscopy Indications:           Heartburn, Diarrhea Providers:             Sonny Poth B. Bonna Gains MD, MD Referring MD:          Jon Billings (Referring MD) Medicines:             Monitored Anesthesia Care Complications:         No immediate complications. Procedure:             Pre-Anesthesia Assessment:                        - Prior to the procedure, a History and Physical was                         performed, and patient medications, allergies and                         sensitivities were reviewed. The patient's tolerance                         of previous anesthesia was reviewed.                        - The risks and benefits of the procedure and the                         sedation options and risks were discussed with the                         patient. All questions were answered and informed                         consent was obtained.                        - Patient identification and proposed procedure were                         verified prior to the procedure by the physician, the                         nurse, the anesthesiologist, the anesthetist and the                         technician. The procedure was verified in the                         procedure room.                        - ASA Grade Assessment: II - A patient with mild  systemic disease.                        After obtaining informed consent, the endoscope was                         passed under direct vision. Throughout the procedure,                         the patient's blood pressure, pulse, and oxygen                          saturations were monitored continuously. The Endoscope                         was introduced through the mouth, and advanced to the                         second part of duodenum. The upper GI endoscopy was                         accomplished with ease. The patient tolerated the                         procedure well. Findings:      The Z-line was irregular. As per guidelines salmon colored mucosa 1 cm       above the GE Junction should be biopsied. This was      not the case here, and the Z-line was just irregular and salmon colored       mucosa was      not 1 cm or more in length and thus biopsies are not indicated.      The examined esophagus was normal.      The entire examined stomach was normal. Biopsies were obtained in the       gastric body, at the incisura and in the gastric antrum with cold       forceps for histology. Biopsies were taken with a cold forceps for       Helicobacter pylori testing.      The duodenal bulb, second portion of the duodenum and examined duodenum       were normal. Biopsies for histology were taken with a cold forceps for       evaluation of celiac disease. Biopsies were done around the 5 to 6 o       clock positions to avoid biopsies near the area the ampulla, which is       usually seen around the 11 to 12 o clock positions. The ampulla was not       seen on this exam. Impression:            - Normal esophagus.                        - Normal stomach. Biopsied.                        - Normal duodenal bulb, second portion of the duodenum                         and examined duodenum. Biopsied.                        -  Biopsies were obtained in the gastric body, at the                         incisura and in the gastric antrum. Recommendation:        - Await pathology results.                        - Discharge patient to home (with escort).                        - Advance diet as tolerated.                        - Continue present  medications.                        - Patient has a contact number available for                         emergencies. The signs and symptoms of potential                         delayed complications were discussed with the patient.                         Return to normal activities tomorrow. Written                         discharge instructions were provided to the patient.                        - Discharge patient to home (with escort).                        - The findings and recommendations were discussed with                         the patient.                        - The findings and recommendations were discussed with                         the patient's family. Procedure Code(s):     --- Professional ---                        [phone], Esophagogastroduodenoscopy, flexible,                         transoral; with biopsy, single or multiple Diagnosis Code(s):     --- Professional ---                        R12, Heartburn                        R19.7, Diarrhea, unspecified CPT copyright 2019 American Medical Association. All rights reserved. The codes documented in this report are preliminary and upon coder review may  be revised to meet current compliance requirements.  Vonda Antigua, MD Margretta Sidle B. Bonna Gains MD, MD  07/22/2021 10:11:40 AM This report has been signed electronically. Number of Addenda: 0 Note Initiated On: 07/22/2021 9:50 AM Estimated Blood Loss:  Estimated blood loss: none.      [facility]

## 2021-07-22 NOTE — Transfer of Care (Signed)
Immediate Anesthesia Transfer of Care Note  Patient: Frances Davidson  Procedure(s) Performed: COLONOSCOPY WITH PROPOFOL ESOPHAGOGASTRODUODENOSCOPY (EGD)  Patient Location: PACU  Anesthesia Type:General  Level of Consciousness: sedated  Airway & Oxygen Therapy: Patient Spontanous Breathing and Patient connected to nasal cannula oxygen  Post-op Assessment: Report given to RN and Post -op Vital signs reviewed and stable  Post vital signs: Reviewed and stable  Last Vitals:  Vitals Value Taken Time  BP 109/81 07/22/21 1030  Temp    Pulse 85 07/22/21 1031  Resp 13 07/22/21 1031  SpO2 97 % 07/22/21 1031  Vitals shown include unvalidated device data.  Last Pain:  Vitals:   07/22/21 0924  TempSrc: Tympanic  PainSc: 0-No pain         Complications: No notable events documented.

## 2021-07-24 LAB — SURGICAL PATHOLOGY

## 2021-07-28 ENCOUNTER — Ambulatory Visit: Payer: Medicaid Other | Admitting: Licensed Clinical Social Worker

## 2021-07-28 NOTE — Chronic Care Management (AMB) (Signed)
Care Management Clinical Social Work Note  07/28/2021 Name: Frances Davidson MRN: [id number] DOB: 1960/11/20  Frances Davidson is a 60 y.o. year old female who is a primary care patient of Jon Billings, NP.  The Care Management team was consulted for assistance with chronic disease management and coordination needs.  Engaged with patient by telephone for follow up visit in response to provider referral for social work chronic care management and care coordination services  Consent to Services:  Ms. Kuntzman was given information about Care Management services today including:  Care Management services includes personalized support from designated clinical staff supervised by her physician, including individualized plan of care and coordination with other care providers 24/7 contact phone numbers for assistance for urgent and routine care needs. The patient may stop case management services at any time by phone call to the office staff.  Patient agreed to services and consent obtained.   Consent to Services:  The patient was given information about Care Management services, agreed to services, and gave verbal consent prior to initiation of services.  Please see initial visit note for detailed documentation.   Patient agreed to services today and consent obtained.  Engaged with patient by phone in response to provider referral for social work care coordination services:  Assessment/Interventions:  Patient continues to maintain positive progress with care plan goals. She reports decrease in depression symptoms with healthy coping skills. Patient was denied CAFA and has plans to re-apply in February 2023. CCM LCSW scheduled f/up appt in January to review application requirements to enhance eligibility for coverage.  See Care Plan below for interventions and patient self-care activities.  Recent life changes or stressors: Management of health conditions and medical coverage  Recommendation:  Patient may benefit from, and is in agreement work with LCSW to address care coordination needs and will continue to work with the clinical team to address health care and disease management related needs.   Follow up Plan: Patient would like continued follow-up from CCM LCSW .  per patient's request will follow up in 10/20/21.  Will call office if needed prior to next encounter.  SDOH (Social Determinants of Health) assessments and interventions performed:    Advanced Directives Status: Not addressed in this encounter.  Care Plan  No Known Allergies  Outpatient Encounter Medications as of 07/28/2021  Medication Sig   atorvastatin (LIPITOR) 20 MG tablet Take 1 tablet (20 mg total) by mouth daily.   omeprazole (PRILOSEC OTC) 20 MG tablet Take 1 tablet (20 mg total) by mouth daily.   No facility-administered encounter medications on file as of 07/28/2021.    Patient Active Problem List   Diagnosis Date Noted   Diarrhea    Heartburn    Erythema of colon    Family history of cancer in mother 02/18/2021   Depression, major, single episode, mild ([place]) 02/10/2021   H/O appendicitis    Appendiceal abscess     Conditions to be addressed/monitored: Depression; Financial constraints related to medical coverage  Care Plan : General Social Work (Adult)  Updates made by Rebekah Chesterfield, LCSW since 07/28/2021 12:00 AM     Problem: Coping Skills (General Plan of Care)   Priority: High  Onset Date: 03/28/2021     Goal: Coping Skills Enhanced   Start Date: 03/28/2021  This Visit's Progress: On track  Recent Progress: On track  Priority: Medium  Note:   Current barriers:   Acute Mental Health needs related to depression Financial constraints related to insurance and  Mental Health Concerns  Needs Support, Education, and Care Coordination in order to meet unmet mental health needs. Clinical Goal(s): Over the next 120 days, patient will work with SW, counselor and therapist to reduce or  manage symptoms of agitation, mood instability, stress, and bipolar until connected for ongoing counseling. Clinical Interventions:  Assessed patient's previous and current treatment, coping skills, support system and barriers to care  Patient interviewed and appropriate assessments performed Patient applied for CAFA; however, was denied due to not re-submitting information in their time-frame. Patient reports that she was unaware of the submission date and is unable to re-apply until February 2023. CCM LCSW provided validation to feelings of frustration and encouragement. CCM LCSW scheduled f/up appt with patient in January 2023 to review required paperwork and submission of a new CAFA Patient was encouraged to maintain bills received from [facility] that may be eligible for a discount next year. Pt verbalized understanding Patient is doing well managing symptoms of depression. She has decreased ruminating on things she can not control which promotes positive mood CCM LCSW reviewed upcoming appts. Patient agreed to follow up with GI regarding results from recent colonoscopy Patient reports that she was experiencing an increase in stress due to mother's declining health a few months ago. During this time, patient visited mother in [place] to provide additional support. Since then, her health has improved and patient has returned to [facility] Patient reports that she has difficulty managing medical conditions due to inability to afford referrals to specialists. Patient needs to see a GI and has an appointment in August 2022 Patient reports that she has applied for Medicaid about 3 years ago. At that she was denied; however, patient is interested in re-applying to assist with medical costs  Patient shared that she may be ineligible due to being over income. She receives Fish farm manager (approx. [phone]) CCM LCSW informed patient of the [place] Program to assist with medical costs and/or referrals within the  [facility] system. Patient provided verbal consent for LCSW to mail application to address on file. 07/28: Patient has not received CAFA application. CCM LCSW will complete a referral to Care Guide to mail application. LCSW verified address on file is correct Patient was successful in identifying healthy coping skills (gardening and spending time with self) to assist with management of stress self 07/28: Patient continues to utilize self-care strategies to cope with stressors Patient receives support from family and friends CCM LCSW discussed strategies to assist patient with establishing and maintaining healthy boundaries Patient denies any new stressors or resource needs. She agreed to contact LCSW with any questions or concerns regarding CAFA application  Review various resources, discussed options and provided patient information about Various insurance options ( [employer] Card, [employer]  and [place]) Solution-Focused Strategies, Active listening / Reflection utilized , Emotional Supportive Provided, Psychoeducation for mental health needs , Caregiver stress acknowledged , and Verbalization of feelings encouraged  ; Discussed plans with patient for ongoing care management follow up and provided patient with direct contact information for care management team Collaboration with PCP regarding development and update of comprehensive plan of care as evidenced by provider attestation and co-signature Inter-disciplinary care team collaboration (see longitudinal plan of care) Patient Goals/Self-Care Activities: Over the next 120 days Attend scheduled appointments Contact clinic with any questions or concerns Complete CAFA application  Utilize strategies discussed to assist with management of symptoms        Christa See, MSW, [place]  Management [email or web address] Phone [phone] 9:40  AM

## 2021-07-28 NOTE — Patient Instructions (Signed)
Visit Information  Patient verbalizes understanding of instructions provided today and agrees to view in [place].   Telephone follow up appointment with care management team member scheduled for:10/20/21  Christa See, MSW, [email or web address] Phone [phone] 9:43 AM

## 2021-08-21 ENCOUNTER — Ambulatory Visit: Payer: Self-pay | Admitting: Licensed Clinical Social Worker

## 2021-08-27 NOTE — Patient Instructions (Signed)
Visit Information  Thank you for taking time to visit with me today. Please don't hesitate to contact me if I can be of assistance to you before our next scheduled telephone appointment.  Following are the goals we discussed today:  Patient Goals/Self-Care Activities: Over the next 120 days Attend scheduled appointments Contact clinic with any questions or concerns Complete CAFA application  Utilize strategies discussed to assist with management of symptoms  Our next appointment is in-person at @PCP @'s office on 10/20/21 at 3:30 PM  Please call the care guide team at [phone] if you need to cancel or reschedule your appointment.   If you are experiencing a Mental Health or [place] or need someone to talk to, please call 911   Patient verbalizes understanding of instructions provided today and agrees to view in [place].   Christa See, MSW, [email or web address] Phone [phone] 11:45 AM

## 2021-08-27 NOTE — Chronic Care Management (AMB) (Signed)
Care Management Clinical Social Work Note  08/27/2021 Name: Frances Davidson MRN: [id number] DOB: [birth year]  Frances Davidson is a 60 y.o. year old female who is a primary care patient of Jon Billings, NP.  The Care Management team was consulted for assistance with chronic disease management and coordination needs.  Engaged with patient by telephone for follow up visit in response to provider referral for social work chronic care management and care coordination services  Consent to Services:  Frances Davidson was given information about Care Management services today including:  Care Management services includes personalized support from designated clinical staff supervised by her physician, including individualized plan of care and coordination with other care providers 24/7 contact phone numbers for assistance for urgent and routine care needs. The patient may stop case management services at any time by phone call to the office staff.  Patient agreed to services and consent obtained.   Consent to Services:  The patient was given information about Care Management services, agreed to services, and gave verbal consent prior to initiation of services.  Please see initial visit note for detailed documentation.   Patient agreed to services today and consent obtained.  Engaged with patient by phone in response to provider referral for social work care coordination services:  Assessment/Interventions:  Patient continues to maintain positive progress with care plan goals. Patient received letter from IRS regarding medical coverage. CCM LCSW discussed various options to obtain coverage, including, Healthcare Marketplace. Patient would like to continue with plans to apply for CAFA 2023 See Care Plan below for interventions and patient self-care activities.  Recent life changes or stressors: Financial strain  Recommendation: Patient may benefit from, and is in agreement work with LCSW to address  care coordination needs and will continue to work with the clinical team to address health care and disease management related needs.   Follow up Plan: Patient would like continued follow-up from CCM LCSW.  per patient's request will follow up in 10/20/21.  Will call office if needed prior to next encounter.  SDOH (Social Determinants of Health) assessments and interventions performed:    Advanced Directives Status: Not addressed in this encounter.  Care Plan  No Known Allergies  Outpatient Encounter Medications as of 08/21/2021  Medication Sig   atorvastatin (LIPITOR) 20 MG tablet Take 1 tablet (20 mg total) by mouth daily.   omeprazole (PRILOSEC OTC) 20 MG tablet Take 1 tablet (20 mg total) by mouth daily.   No facility-administered encounter medications on file as of 08/21/2021.    Patient Active Problem List   Diagnosis Date Noted   Diarrhea    Heartburn    Erythema of colon    Family history of cancer in mother 02/18/2021   Depression, major, single episode, mild ([place]) 02/10/2021   H/O appendicitis    Appendiceal abscess     Conditions to be addressed/monitored: Depression; Financial constraints related to medical coverage  Care Plan : General Social Work (Adult)  Updates made by Frances Chesterfield, LCSW since 08/27/2021 12:00 AM     Problem: Coping Skills (General Plan of Care)   Priority: High  Onset Date: 03/28/2021     Goal: Coping Skills Enhanced   Start Date: 03/28/2021  Recent Progress: On track  Priority: Medium  Note:   Current barriers:   Acute Mental Health needs related to depression Financial constraints related to insurance and Mental Health Concerns  Needs Support, Education, and Care Coordination in order to meet unmet mental health needs. Clinical Goal(s):  Over the next 120 days, patient will work with SW, counselor and therapist to reduce or manage symptoms of agitation, mood instability, stress, and bipolar until connected for ongoing  counseling. Clinical Interventions:  Assessed patient's previous and current treatment, coping skills, support system and barriers to care  Patient interviewed and appropriate assessments performed Patient applied for CAFA; however, was denied due to not re-submitting information in their time-frame. Patient reports that she was unaware of the submission date and is unable to re-apply until February 2023. CCM LCSW provided validation to feelings of frustration and encouragement. CCM LCSW scheduled f/up appt with patient in January 2023 to review required paperwork and submission of a new CAFA 12/01: Patient received letter from IRS regarding medical coverage. CCM LCSW discussed various options to obtain coverage, including, Healthcare Marketplace. Patient would like to continue with plans to apply for CAFA 2023 Patient was encouraged to maintain bills received from [facility] that may be eligible for a discount next year. Pt verbalized understanding Patient is doing well managing symptoms of depression. She has decreased ruminating on things she can not control which promotes positive mood CCM LCSW reviewed upcoming appts. Patient agreed to follow up with GI regarding results from recent colonoscopy Patient reports that she was experiencing an increase in stress due to mother's declining health a few months ago. During this time, patient visited mother in [place] to provide additional support. Since then, her health has improved and patient has returned to [facility] Patient reports that she has difficulty managing medical conditions due to inability to afford referrals to specialists. Patient needs to see a GI and has an appointment in August 2022 Patient reports that she has applied for Medicaid about 3 years ago. At that she was denied; however, patient is interested in re-applying to assist with medical costs  Patient shared that she may be ineligible due to being over income. She receives Fish farm manager (approx.  [phone]) CCM LCSW informed patient of the [place] Program to assist with medical costs and/or referrals within the [facility] system. Patient provided verbal consent for LCSW to mail application to address on file. 07/28: Patient has not received CAFA application. CCM LCSW will complete a referral to Care Guide to mail application. LCSW verified address on file is correct Patient was successful in identifying healthy coping skills (gardening and spending time with self) to assist with management of stress self 07/28: Patient continues to utilize self-care strategies to cope with stressors Patient receives support from family and friends CCM LCSW discussed strategies to assist patient with establishing and maintaining healthy boundaries Patient denies any new stressors or resource needs. She agreed to contact LCSW with any questions or concerns regarding CAFA application  Review various resources, discussed options and provided patient information about Various insurance options ( [employer] Card, [employer]  and [place]) Solution-Focused Strategies, Active listening / Reflection utilized , Emotional Supportive Provided, Psychoeducation for mental health needs , Caregiver stress acknowledged , and Verbalization of feelings encouraged  ; Discussed plans with patient for ongoing care management follow up and provided patient with direct contact information for care management team Collaboration with PCP regarding development and update of comprehensive plan of care as evidenced by provider attestation and co-signature Inter-disciplinary care team collaboration (see longitudinal plan of care) Patient Goals/Self-Care Activities: Over the next 120 days Attend scheduled appointments Contact clinic with any questions or concerns Complete CAFA application  Utilize strategies discussed to assist with management of symptoms  Christa See, MSW, [email or web address] Phone [phone] 11:42 AM

## 2021-09-05 ENCOUNTER — Telehealth: Payer: Self-pay | Admitting: Gastroenterology

## 2021-09-05 NOTE — Telephone Encounter (Signed)
Inbound call from pt requesting a call back for someone to go over her results again. Thank you.

## 2021-09-12 NOTE — Telephone Encounter (Signed)
Left message on voicemail.

## 2021-10-20 ENCOUNTER — Ambulatory Visit: Payer: Self-pay | Admitting: Licensed Clinical Social Worker

## 2021-10-23 NOTE — Patient Instructions (Signed)
Visit Information  Thank you for taking time to visit with me today. Please don't hesitate to contact me if I can be of assistance to you before our next scheduled telephone appointment.  Following are the goals we discussed today:  Patient Goals/Self-Care Activities: Over the next 120 days Attend scheduled appointments Contact clinic with any questions or concerns Complete CAFA application  Utilize strategies discussed to assist with management of symptoms  Our next appointment is by telephone on 12/17/21 at 3:00 PM  Please call the care guide team at [phone] if you need to cancel or reschedule your appointment.   If you are experiencing a Mental Health or [place] or need someone to talk to, please call the [place] National Suicide Prevention Lifeline: 612-845-4772 or TTY: 9546492745 TTY [phone]) to talk to a trained counselor call 911   Patient verbalizes understanding of instructions and care plan provided today and agrees to view in [place]. Active MyChart status confirmed with patient.    Christa See, MSW, [email or web address] Phone [phone] 8:13 PM

## 2021-10-23 NOTE — Chronic Care Management (AMB) (Signed)
Care Management Clinical Social Work Note  10/23/2021 Name: Frances Davidson MRN: [id number] DOB: November 02, 1960  Frances Davidson is a 61 y.o. year old female who is a primary care patient of Frances Billings, NP.  The Care Management team was consulted for assistance with chronic disease management and coordination needs.  Engaged with patient face to face for follow up visit in response to provider referral for social work chronic care management and care coordination services  Consent to Services:  Frances Davidson was given information about Care Management services today including:  Care Management services includes personalized support from designated clinical staff supervised by her physician, including individualized plan of care and coordination with other care providers 24/7 contact phone numbers for assistance for urgent and routine care needs. The patient may stop case management services at any time by phone call to the office staff.  Patient agreed to services and consent obtained.   Summary:  Patient continues to maintain positive progress with care plan goals. Patient and LCSW discussed supportive information to complete CAFA. Patient decided to research healthplans through [place], prior to applying for CAFA.  See Care Plan below for interventions and patient self-care activities.  Recommendation: Patient may benefit from, and is in agreement work with LCSW to address care coordination needs and will continue to work with the clinical team to address health care and disease management related needs.   Follow up Plan: Patient would like continued follow-up from CCM LCSW.  per patient's request will follow up in 8-12 weeks.  Will call office if needed prior to next encounter.   SDOH (Social Determinants of Health) assessments and interventions performed:    Advanced Directives Status: Not addressed in this encounter.  Care Plan  No Known Allergies  Outpatient  Encounter Medications as of 10/20/2021  Medication Sig   atorvastatin (LIPITOR) 20 MG tablet Take 1 tablet (20 mg total) by mouth daily.   omeprazole (PRILOSEC OTC) 20 MG tablet Take 1 tablet (20 mg total) by mouth daily.   No facility-administered encounter medications on file as of 10/20/2021.    Patient Active Problem List   Diagnosis Date Noted   Diarrhea    Heartburn    Erythema of colon    Family history of cancer in mother 02/18/2021   Depression, major, single episode, mild ([place]) 02/10/2021   H/O appendicitis    Appendiceal abscess     Conditions to be addressed/monitored: Depression; Financial constraints related to obtaining insurance  Care Plan : General Social Work (Adult)  Updates made by Frances Chesterfield, LCSW since 10/23/2021 12:00 AM     Problem: Coping Skills (General Plan of Care)   Priority: High  Onset Date: 03/28/2021     Goal: Coping Skills Enhanced   Start Date: 03/28/2021  This Visit's Progress: On track  Recent Progress: On track  Priority: Medium  Note:   Current barriers:   Acute Mental Health needs related to depression Financial constraints related to insurance and Mental Health Concerns  Needs Support, Education, and Care Coordination in order to meet unmet mental health needs. Clinical Goal(s): Over the next 120 days, patient will work with SW, counselor and therapist to reduce or manage symptoms of agitation, mood instability, stress, and bipolar until connected for ongoing counseling. Clinical Interventions:  Assessed patient's previous and current treatment, coping skills, support system and barriers to care  Patient interviewed and appropriate assessments performed Patient applied for CAFA; however, was denied due to not re-submitting information in their time-frame.  Patient reports that she was unaware of the submission date and is unable to re-apply until February 2023. CCM LCSW provided validation to feelings of frustration and encouragement.  CCM LCSW scheduled f/up appt with patient in January 2023 to review required paperwork and submission of a new CAFA 12/01: Patient received letter from IRS regarding medical coverage. CCM LCSW discussed various options to obtain coverage, including, Healthcare Marketplace. Patient would like to continue with plans to apply for CAFA 2023 1/30: Patient and LCSW discussed supportive information to complete CAFA. Patient decided to research healthplans through [place], prior to applying for CAFA Patient was encouraged to maintain bills received from [facility] that may be eligible for a discount next year. Pt verbalized understanding Patient is doing well managing symptoms of depression. She has decreased ruminating on things she can not control which promotes positive mood CCM LCSW reviewed upcoming appts. Patient agreed to follow up with GI regarding results from recent colonoscopy Patient reports that she was experiencing an increase in stress due to mother's declining health a few months ago. During this time, patient visited mother in [place] to provide additional support. Since then, her health has improved and patient has returned to [facility] Patient reports that she has difficulty managing medical conditions due to inability to afford referrals to specialists. Patient needs to see a GI and has an appointment in August 2022 Patient reports that she has applied for Medicaid about 3 years ago. At that she was denied; however, patient is interested in re-applying to assist with medical costs  Patient shared that she may be ineligible due to being over income. 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 Patient provided verbal consent for LCSW to mail application to address on file. 07/28: Patient has not received CAFA application. CCM LCSW will complete a referral to  Care Guide to mail application. LCSW verified address on file is correct Patient was successful in identifying healthy coping skills (gardening and spending time with self) to assist with management of stress self 07/28: Patient continues to utilize self-care strategies to cope with stressors Patient receives support from family and friends CCM LCSW discussed strategies to assist patient with establishing and maintaining healthy boundaries Patient denies any new stressors or resource needs. She agreed to contact LCSW with any questions or concerns regarding CAFA application  Review various resources, discussed options and provided patient information about Various insurance options ( [employer], [employer]  and [place]) Solution-Focused Strategies, Active listening / Reflection utilized , Emotional Supportive Provided, Psychoeducation for mental health needs , Caregiver stress acknowledged , and Verbalization of feelings encouraged  ; Discussed plans with patient for ongoing care management follow up and provided patient with direct contact information for care management team Collaboration with PCP regarding development and update of comprehensive plan of care as evidenced by provider attestation and co-signature Inter-disciplinary care team collaboration (see longitudinal plan of care) Patient Goals/Self-Care Activities: Over the next 120 days Attend scheduled appointments Contact clinic with any questions or concerns Complete CAFA application  Utilize strategies discussed to assist with management of symptoms        Christa See, MSW, [email or web address] Phone [phone] 8:10 PM

## 2021-12-08 ENCOUNTER — Ambulatory Visit: Payer: Self-pay | Admitting: Nurse Practitioner

## 2021-12-17 ENCOUNTER — Ambulatory Visit: Payer: Self-pay | Admitting: Licensed Clinical Social Worker

## 2021-12-17 NOTE — Patient Instructions (Addendum)
Visit Information ? ?Thank you for taking time to visit with me today. Please don't hesitate to contact me if I can be of assistance to you before our next scheduled telephone appointment. ? ?Following are the goals we discussed today:  ?Patient Goals/Self-Care Activities:  ?Attend scheduled appointments ?Contact clinic with any questions or concerns ?Complete CAFA application  ?Utilize strategies discussed to assist with management of symptoms ? ?No follow up scheduled, per our conversation you do not desire continued follow up ?I will disconnect from your care team at this time, please call the office if additional needs are identified  ?Please call the care guide team at [phone] if you need to schedule an appointment.  ? ?If you are experiencing a Mental Health or [place] or need someone to talk to, please call the Suicide and Crisis Lifeline: 988 ?call the [place] National Suicide Prevention Lifeline: 915-344-6620 or TTY: 8052980547 TTY (516)775-6934) to talk to a trained counselor ?call 1-800-273-TALK (toll free, 24 hour hotline) ?call 911  ? ?Patient verbalizes understanding of instructions and care plan provided today and agrees to view in [place]. Active MyChart status confirmed with patient.   ? ?Casimer Lanius, LCSW ?Licensed Clinical Social Worker [facility] [employer]  ?CCM LCSW Coverage for Christa See, LCSW ? ?

## 2021-12-17 NOTE — Chronic Care Management (AMB) (Signed)
Care Management ?Clinical Social Work Note ? ?12/17/2021 ?Name: Lether Tesch MRN: [id number] DOB: [birth year] ? ?Adryan Druckenmiller is a 61 y.o. year old female who is a primary care patient of Jon Billings, NP.  The Care Management team was consulted for assistance with chronic disease management and coordination needs. ? ?Engaged with patient by telephone for follow up visit in response to provider referral for social work chronic care management and care coordination services ? ?Consent to Services:  ?Ms. Tschirhart was given information about Care Management services today including:  ?Care Management services includes personalized support from designated clinical staff supervised by her physician, including individualized plan of care and coordination with other care providers ?24/7 contact phone numbers for assistance for urgent and routine care needs. ?The patient may stop case management services at any time by phone call to the office staff. ? ?Patient agreed to services and consent obtained.  ? ?Summary: Assessed patient's current treatment, progress, coping skills, support system and barriers to care.  She reports she is doing well, will continue paying out of pocket for ongoing need and will contact LCSW if needed .  See Care Plan below for interventions and patient self-care actives. ? ?Follow up Plan:  ?Patient does not desire continued follow-up by CCM LCSW. Will contact the office if needed ?CCM LCSW will disconnect from patient's care team at this time, but will be available at any time they would like to re-engage for care coordination services.  ?  ?Assessment: Review of patient past medical history, allergies, medications, and health status, including review of relevant consultants reports was performed today as part of a comprehensive evaluation and provision of chronic care management and care coordination services. ? ?SDOH (Social Determinants of Health) assessments and interventions  performed:   ? ?Advanced Directives Status: Not addressed in this encounter. ? ?Care Plan ?Conditions to be addressed/monitored: Depression; no insurance  ? ?Care Plan : General Social Work (Adult)  ?Updates made by Maurine Cane, LCSW since 12/17/2021 12:00 AM  ?  ? ?Problem: Coping Skills (General Plan of Care)   ?Priority: High  ?Onset Date: 03/28/2021  ?  ? ?Goal: Coping Skills Enhanced Completed 12/17/2021  ?Start Date: 03/28/2021  ?This Visit's Progress: On track  ?Recent Progress: On track  ?Priority: Medium  ?Note:   ?Current barriers:   ?Acute Mental Health needs related to depression ?Financial constraints related to insurance and Mental Health Concerns  ?Needs Support, Education, and Care Coordination in order to meet unmet mental health needs. ?Clinical Goal(s): Over the next 120 days, patient will work with SW, counselor and therapist to reduce or manage symptoms of agitation, mood instability, stress, and bipolar until connected for ongoing counseling. ?Clinical Interventions:  ?Assessed patient's previous and current treatment, coping skills, support system and barriers to care  ?Patient interviewed and appropriate assessments performed ?Patient applied for CAFA; however, was denied due to not re-submitting information in their time-frame. Patient reports that she was unaware of the submission date and is unable to re-apply until February 2023. CCM LCSW provided validation to feelings of frustration and encouragement. CCM LCSW scheduled f/up appt with patient in January 2023 to review required paperwork and submission of a new CAFA 12/01: Patient received letter from IRS regarding medical coverage. CCM LCSW discussed various options to obtain coverage, including, Healthcare Marketplace. Patient would like to continue with plans to apply for CAFA 2023 1/30: Patient and LCSW discussed supportive information to complete CAFA. Patient decided to research healthplans through [employer],  prior to  applying for CAFA ?Patient was encouraged to maintain bills received from [facility] that may be eligible for a discount next year. Pt verbalized understanding ?Patient is doing well managing symptoms of depression. She has decreased ruminating on things she can not control which promotes positive mood ?CCM LCSW reviewed upcoming appts. Patient agreed to follow up with GI regarding results from recent colonoscopy ?Patient reports that she was experiencing an increase in stress due to mother's declining health a few months ago. During this time, patient visited mother in [place] to provide additional support. Since then, her health has improved and patient has returned to [place] ?Patient reports that she has difficulty managing medical conditions due to inability to afford referrals to specialists. Patient needs to see a GI and has an appointment in August 2022 ?Patient reports that she has applied for Medicaid about 3 years ago. At that she was denied; however, patient is interested in re-applying to assist with medical costs  ?Patient shared that she may be ineligible due to being over income. She receives social security (approx. $950) ?CCM LCSW informed patient of the [place] Program to assist with medical costs and/or referrals within the [facility] system. Patient provided verbal consent for LCSW to mail application to address on file. 07/28: Patient has not received CAFA application. CCM LCSW will complete a referral to Care Guide to mail application. LCSW verified address on file is correct ?Patient was successful in identifying healthy coping skills (gardening and spending time with self) to assist with management of stress self 07/28: Patient continues to utilize self-care strategies to cope with stressors ?Patient receives support from family and friends ?CCM LCSW discussed strategies to assist patient with establishing and maintaining healthy boundaries ?Patient denies any new stressors or resource  needs. She agreed to contact LCSW with any questions or concerns regarding CAFA application  ?Review various resources, discussed options and provided patient information about Various insurance options ( [employer] Card, [place] and [place]) ?Solution-Focused Strategies, Active listening / Reflection utilized , Emotional Supportive Provided, Psychoeducation for mental health needs , Caregiver stress acknowledged , and Verbalization of feelings encouraged  ; ?Discussed plans with patient for ongoing care management follow up and provided patient with direct contact information for care management team ?Collaboration with PCP regarding development and update of comprehensive plan of care as evidenced by provider attestation and co-signature ?Inter-disciplinary care team collaboration (see longitudinal plan of care) ?Patient Goals/Self-Care Activities:  ?Attend scheduled appointments ?Contact clinic with any questions or concerns ?Complete CAFA application  ?Utilize strategies discussed to assist with management of symptoms ? ? ?  ?Casimer Lanius, LCSW ?Licensed Clinical Social Worker [facility] [employer]  ?CCM LCSW Coverage for Christa See, LCSW ?[phone]  ?

## 2021-12-22 ENCOUNTER — Ambulatory Visit (INDEPENDENT_AMBULATORY_CARE_PROVIDER_SITE_OTHER): Payer: Self-pay | Admitting: Nurse Practitioner

## 2021-12-22 ENCOUNTER — Encounter: Payer: Self-pay | Admitting: Nurse Practitioner

## 2021-12-22 VITALS — BP 122/86 | HR 81 | Temp 98.7°F | Wt 153.0 lb

## 2021-12-22 DIAGNOSIS — G8929 Other chronic pain: Secondary | ICD-10-CM

## 2021-12-22 DIAGNOSIS — R197 Diarrhea, unspecified: Secondary | ICD-10-CM

## 2021-12-22 DIAGNOSIS — M25561 Pain in right knee: Secondary | ICD-10-CM

## 2021-12-22 MED ORDER — PREDNISONE 10 MG PO TABS: 10.0000 mg | ORAL_TABLET | Freq: Every day | ORAL | 0 refills | Status: DC

## 2021-12-22 NOTE — Progress Notes (Signed)
? ?BP 122/86   Pulse 81   Temp 98.7 ?F (37.1 ?C) (Oral)   Wt 153 lb (69.4 kg)   LMP 04/21/2014 (Approximate)   SpO2 98%   BMI 24.69 kg/m?   ? ?Subjective:  ? ? Patient ID: Frances Davidson, female    DOB: [birth year], 61 y.o.   MRN: [id number] ? ?HPI: ?Nasim Garofano is a 61 y.o. female ? ?Chief Complaint  ?Patient presents with  ? Diarrhea  ?  Still ongoing per patient. Diarrhea occurs with everything she eats/drinks per pt. At least having 3 episodes of diarrhea weekly.   ? Knee Pain  ?  R knee pain x 1 year worsening per patient   ? ? ?Patient states she saw GI and had a colonoscopy and they didn't find a cause of her diarrhea.  States when she drinks water it causes her to have abdominal pain.  She has diarrhea every other day and it does not matter what she eats or drinks. ? ?KNEE PAIN ?Duration: chronic ?Involved knee: right ?Mechanism of injury: trauma ?Location:medial ?Onset: gradual ?Severity: 8/10  ?Quality:  sharp and aching ?Frequency: intermittent ?Radiation: no ?Aggravating factors: walking and bending  ?Alleviating factors: NSAIDs  ?Status: better ?Treatments attempted: rest and ibuprofen  ?Relief with NSAIDs?:  moderate ?Weakness with weight bearing or walking: yes ?Sensation of giving way: no ?Locking: no ?Popping: no ?Bruising: no ?Swelling: no ?Redness: no ?Paresthesias/decreased sensation: no ?Fevers: no ? ?Relevant past medical, surgical, family and social history reviewed and updated as indicated. Interim medical history since our last visit reviewed. ?Allergies and medications reviewed and updated. ? ?Review of Systems  ?Gastrointestinal:  Positive for abdominal pain and diarrhea.  ?Musculoskeletal:   ?     Left knee pain  ? ?Per HPI unless specifically indicated above ? ?   ?Objective:  ?  ?BP 122/86   Pulse 81   Temp 98.7 ?F (37.1 ?C) (Oral)   Wt 153 lb (69.4 kg)   LMP 04/21/2014 (Approximate)   SpO2 98%   BMI 24.69 kg/m?   ?Wt Readings from Last 3 Encounters:  ?12/22/21 153 lb  (69.4 kg)  ?07/22/21 146 lb 15.7 oz (66.7 kg)  ?07/08/21 147 lb (66.7 kg)  ?  ?Physical Exam ?Vitals and nursing note reviewed.  ?Constitutional:   ?   General: She is not in acute distress. ?   Appearance: Normal appearance. She is normal weight. She is not ill-appearing, toxic-appearing or diaphoretic.  ?HENT:  ?   Head: Normocephalic.  ?   Right Ear: External ear normal.  ?   Left Ear: External ear normal.  ?   Nose: Nose normal.  ?   Mouth/Throat:  ?   Mouth: Mucous membranes are moist.  ?   Pharynx: Oropharynx is clear.  ?Eyes:  ?   General:     ?   Right eye: No discharge.     ?   Left eye: No discharge.  ?   Extraocular Movements: Extraocular movements intact.  ?   Conjunctiva/sclera: Conjunctivae normal.  ?   Pupils: Pupils are equal, round, and reactive to light.  ?Cardiovascular:  ?   Rate and Rhythm: Normal rate and regular rhythm.  ?   Heart sounds: No murmur heard. ?Pulmonary:  ?   Effort: Pulmonary effort is normal. No respiratory distress.  ?   Breath sounds: Normal breath sounds. No wheezing or rales.  ?Musculoskeletal:  ?   Cervical back: Normal range of motion and neck supple.  ?  Right knee: Swelling present. No deformity, erythema or bony tenderness. No tenderness.  ?Skin: ?   General: Skin is warm and dry.  ?   Capillary Refill: Capillary refill takes less than 2 seconds.  ?Neurological:  ?   General: No focal deficit present.  ?   Mental Status: She is alert and oriented to person, place, and time. Mental status is at baseline.  ?Psychiatric:     ?   Mood and Affect: Mood normal.     ?   Behavior: Behavior normal.     ?   Thought Content: Thought content normal.     ?   Judgment: Judgment normal.  ? ? ?Results for orders placed or performed during the hospital encounter of 07/22/21  ?Surgical pathology  ?Result Value Ref Range  ? SURGICAL PATHOLOGY    ?  SURGICAL PATHOLOGY ?CASE: [phone] ?PATIENT: Kehaulani Stoltz ?Surgical Pathology Report ? ? ? ? ?Specimen Submitted: ?A. Duodenum;  cbx ?B. Stomach; cbx ?C. Colon, asc, cecum; cbx ?D. Colon, random; cbx ? ?Clinical History: GERD K21.9, diarrhea R19.7. External hemorrhoids, ?diverticulosis. ? ? ? ?DIAGNOSIS: ?A. DUODENUM; COLD BIOPSY: ?- ENTERIC MUCOSA WITH PRESERVED VILLOUS ARCHITECTURE AND NO SIGNIFICANT ?HISTOPATHOLOGIC CHANGE. ?- NEGATIVE FOR FEATURES OF CELIAC, DYSPLASIA, AND MALIGNANCY. ? ?B. STOMACH; COLD BIOPSY: ?- GASTRIC ANTRAL AND OXYNTIC MUCOSA DISPLAYING CHRONIC GASTRITIS WITH ?FOCAL ACTIVITY. ?- NEGATIVE FOR H. PYLORI, DYSPLASIA, AND MALIGNANCY. ? ?Comment: ?Due to the presence of patchy active mucosal inflammation, an ?immunohistochemical study directed against H. pylori was performed, and ?is negative. ? ?C. COLON, ASCENDING AND CECUM; COLD BIOPSY: ?- COLONIC MUCOSA WITH NO SIGNIFICANT HISTOPATHOLOGIC CHANGE. ?- NEGATIVE FOR FEATURES OF MICROSCOPIC COLITIS. ?- NEGAT IVE FOR DYSPLASIA AND MALIGNANCY. ? ?D. COLON, RANDOM; COLD BIOPSY: ?- COLONIC MUCOSA WITH NO SIGNIFICANT HISTOPATHOLOGIC CHANGE. ?- NEGATIVE FOR FEATURES OF MICROSCOPIC COLITIS. ?- NEGATIVE FOR DYSPLASIA AND MALIGNANCY. ? ?IHC slides were prepared by [employer], [place]. All controls stained ?appropriately. ? ?This test was developed and its performance characteristics determined ?by [facility]. It has not been cleared or approved by the [place] Food and Drug ?Administration. The FDA does not require this test to go through ?premarket FDA review. This test is used for clinical purposes. It should ?not be regarded as investigational or for research. This laboratory is ?certified under the Clinical Laboratory Improvement Amendments (CLIA) as ?qualified to perform high complexity clinical laboratory testing. ? ?GROSS DESCRIPTION: ?A. Labeled: cbx duodenum for celiac disease ?Received: Formalin ?Collection time: 10:02 AM on 07/22/2021 ?Placed into formalin time: 10:02 AM on 07/22/2021 ?Tissue fr agment(s): Multiple ?Size: Aggregate, 0.8 x 0.5 x 0.1 cm ?Description: Tan soft  tissue fragments ?Entirely submitted in 1 cassette. ? ?B. Labeled: cbx gastric for H. pylori ?Received: Formalin ?Collection time: 10:04 AM on 07/22/2021 ?Placed into formalin time: 10:04 AM on 07/22/2021 ?Tissue fragment(s): Multiple ?Size: Aggregate, 0.5 x 0.4 x 0.1 cm ?Description: Tan soft tissue fragments ?Entirely submitted in 1 cassette. ? ?C. Labeled: cbx cecal and ascending erythema ?Received: Formalin ?Collection time: 10:19 AM on 07/22/2021 ?Placed into formalin time: 10:19 AM on 07/22/2021 ?Tissue fragment(s): Multiple ?Size: Aggregate, 0.6 x 0.2 x 0.1 cm ?Description: Tan soft tissue fragments ?Entirely submitted in 1 cassette. ? ?D. Labeled: cbx random colon for microscopic colitis ?Received: Formalin ?Collection time: 10:21 AM on 07/22/2021 ?Placed into formalin time: 10:21 AM on 07/22/2021 ?Tissue fragment(s): Multiple ?Size: Aggregate, 0.7 x 0.3 x 0.1 cm ?Description: Tan soft tissue fragments ?E ntirely submitted in 1 cassette ? ?[facility] 07/22/2021. ? ?  Final Diagnosis performed by Allena Napoleon, MD.   Electronically signed ?07/24/2021 2:23:00PM ?The electronic signature indicates that the named Attending Pathologist ?has evaluated the specimen ?Technical component performed at [facility], [address], [place], ?[place] [address] Lab: [phone] Dir: Rush Farmer, MD, MMM ? Professional component performed at [facility], [facility], [place], [place], [place] [address] Lab: [phone] ?Dir: Kathi Simpers, MD ?  ? ?   ?Assessment & Plan:  ? ?Problem List Items Addressed This Visit   ? ?  ? Other  ? Diarrhea - Primary  ?  Chronic. Ongoing. Patient had colonoscopy done which did not show a reason for diarrhea.  Does not have follow up with GI until July.  Will work to get appointment moved up due to ongoing symptoms. ?  ?  ? ?Other Visit Diagnoses   ? ? Chronic pain of right knee      ? Will give prednisone taper today. Will consider xray and knee injection if symptoms continue.  FU if  symptoms worsen or fail to improve.  ? ?  ?  ? ?Follow up plan: ?Return in about 6 months (around 06/23/2022) for HTN, HLD, DM2 FU. ? ? ? ? ? ?

## 2021-12-22 NOTE — Assessment & Plan Note (Signed)
Chronic. Ongoing. Patient had colonoscopy done which did not show a reason for diarrhea.  Does not have follow up with GI until July.  Will work to get appointment moved up due to ongoing symptoms. ?

## 2022-03-19 ENCOUNTER — Ambulatory Visit (INDEPENDENT_AMBULATORY_CARE_PROVIDER_SITE_OTHER): Payer: Self-pay | Admitting: Gastroenterology

## 2022-03-19 ENCOUNTER — Encounter: Payer: Self-pay | Admitting: Gastroenterology

## 2022-03-19 VITALS — BP 123/85 | HR 85 | Temp 98.3°F | Wt 148.6 lb

## 2022-03-19 DIAGNOSIS — K648 Other hemorrhoids: Secondary | ICD-10-CM

## 2022-03-19 DIAGNOSIS — K580 Irritable bowel syndrome with diarrhea: Secondary | ICD-10-CM

## 2022-03-19 MED ORDER — DICYCLOMINE HCL 10 MG PO CAPS: 10.0000 mg | ORAL_CAPSULE | Freq: Four times a day (QID) | ORAL | 0 refills | Status: DC

## 2022-03-19 MED ORDER — RIFAXIMIN 550 MG PO TABS: 550.0000 mg | ORAL_TABLET | Freq: Three times a day (TID) | ORAL | 0 refills | Status: AC

## 2022-03-19 NOTE — Progress Notes (Signed)
Frances Bellows MD, MRCP(U.K) [address]  [place], [place] [address]  Main: [phone]  Fax: [phone]   Primary Care Physician: Jon Billings, NP  Primary Gastroenterologist:  Dr. Jonathon Davidson   Chief Complaint  Patient presents with   Abdominal Pain    HPI: Frances Davidson is a 61 y.o. female   Summary of history :  This is a patient was previously seen Dr. Bonna Gains in October 2022 for 5-year history of loose stools 2-4 loose bowel movements a day no blood in the stool associated with bloating fatigue.  Never had an EGD or colonoscopy.  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  Was also noted to have elevated transaminases.  Plan at last visit was to proceed with EGD and colonoscopy, stool tests and liver work-up. No stool tests were obtained.  Lab work in October 2022 showed mild elevation of AST at 66 b, not immune to hepatitis A and B.  Autoimmune work-up was negative as well as viral hepatitis work-up.  Right upper quadrant ultrasound in July 2022 showed hepatic steatosis.  She did not follow-up after. 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 pylori no evidence of celiac disease either  Interval history October 2022-03/19/2022  She states she is here today to see me for diarrhea after meals which has been going on for many years.  No prior history of a cholecystectomy.  She has cramping right after she eats followed by watery bowel movements denies any artificial sugars or sweeteners denies any consumption of chewing gum.  She also has some perianal itching especially when she wipes.  The abdominal pain is better after bowel  movement   Current Outpatient Medications  Medication Sig Dispense Refill   atorvastatin (LIPITOR) 20 MG tablet Take 1 tablet (20 mg total) by mouth daily. 90 tablet 1   Calcium Carbonate-Vitamin D (CALCIUM-VITAMIN D) 600-3.125 MG-MCG TABS Take 1 capsule by mouth daily.     Multiple Vitamin (MULTIVITAMIN) capsule Take 1 capsule by mouth daily.     omeprazole (PRILOSEC OTC) 20 MG tablet Take 1 tablet (20 mg total) by mouth daily. 90 tablet 1   No current facility-administered medications for this visit.    Allergies as of 03/19/2022   (No Known Allergies)    ROS:  General: Negative for anorexia, weight loss, fever, chills, fatigue, weakness. ENT: Negative for hoarseness, difficulty swallowing , nasal congestion. CV: Negative for chest pain, angina, palpitations, dyspnea on exertion, peripheral edema.  Respiratory: Negative for dyspnea at rest, dyspnea on exertion, cough, sputum, wheezing.  GI: See history of present illness. GU:  Negative for dysuria, hematuria, urinary incontinence, urinary frequency, nocturnal urination.  Endo: Negative for unusual weight change.    Physical Examination:   BP 123/85   Pulse 85   Temp 98.3 F (36.8 C) (Oral)   Wt 148 lb 9.6 oz (67.4 kg)   LMP 04/21/2014 (Approximate)   BMI 23.98 kg/m   General: Well-nourished, well-developed in no acute distress.  Eyes: No icterus. Conjunctivae pink. Mouth: Oropharyngeal mucosa moist and pink , no lesions erythema or exudate. Neuro: Alert and oriented x 3.  Grossly intact. Skin: Warm and dry, no jaundice.   Psych: Alert  and cooperative, normal mood and affect.   Imaging Studies: No results found.  Assessment and Plan:   Frances Davidson is a 61 y.o. y/o female here to see me for postprandial diarrhea from occurring for many years preceded by abdominal pain relieved after bowel movement very suggestive of IBS diarrhea.  Plan  Fecal calprotectin 2.  Trial of Xifaxan 550 mg 3 times daily for IBS  diarrhea, Bentyl 10 mg 4 times daily as needed as needed for pain 3.  Low FODMAP diet 4.  Conservative management of hemorrhoids discussed if no better at next visit we will proceed with banding.  Dr Frances Bellows  MD,MRCP [facility]) Follow up in 4 to 5 weeks

## 2022-03-19 NOTE — Patient Instructions (Signed)
Hemorrhoids Hemorrhoids are swollen veins in and around the rectum or anus. There are two types of hemorrhoids: Internal hemorrhoids. These occur in the veins that are just inside the rectum. They may poke through to the outside and become irritated and painful. External hemorrhoids. These occur in the veins that are outside the anus and can be felt as a painful swelling or hard lump near the anus. Most hemorrhoids do not cause serious problems, and they can be managed with home treatments such as diet and lifestyle changes. If home treatments do not help the symptoms, procedures can be done to shrink or remove the hemorrhoids. What are the causes? This condition is caused by increased pressure in the anal area. This pressure may result from various things, including: Constipation. Straining to have a bowel movement. Diarrhea. Pregnancy. Obesity. Sitting for long periods of time. Heavy lifting or other activity that causes you to strain. Anal sex. Riding a bike for a long period of time. What are the signs or symptoms? Symptoms of this condition include: Pain. Anal itching or irritation. Rectal bleeding. Leakage of stool (feces). Anal swelling. One or more lumps around the anus. How is this diagnosed? This condition can often be diagnosed through a visual exam. Other exams or tests may also be done, such as: An exam that involves feeling the rectal area with a gloved hand (digital rectal exam). An exam of the anal canal that is done using a small tube (anoscope). A blood test, if you have lost a significant amount of blood. A test to look inside the colon using a flexible tube with a camera on the end (sigmoidoscopy or colonoscopy). How is this treated? This condition can usually be treated at home. However, various procedures may be done if dietary changes, lifestyle changes, and other home treatments do not help your symptoms. These procedures can help make the hemorrhoids smaller or  remove them completely. Some of these procedures involve surgery, and others do not. Common procedures include: Rubber band ligation. Rubber bands are placed at the base of the hemorrhoids to cut off their blood supply. Sclerotherapy. Medicine is injected into the hemorrhoids to shrink them. Infrared coagulation. A type of light energy is used to get rid of the hemorrhoids. Hemorrhoidectomy surgery. The hemorrhoids are surgically removed, and the veins that supply them are tied off. Stapled hemorrhoidopexy surgery. The surgeon staples the base of the hemorrhoid to the rectal wall. Follow these instructions at home: Eating and drinking  Eat foods that have a lot of fiber in them, such as whole grains, beans, nuts, fruits, and vegetables. Ask your health care provider about taking products that have added fiber (fiber supplements). Reduce the amount of fat in your diet. You can do this by eating low-fat dairy products, eating less red meat, and avoiding processed foods. Drink enough fluid to keep your urine pale yellow. Managing pain and swelling  Take warm sitz baths for 20 minutes, 3-4 times a day to ease pain and discomfort. You may do this in a bathtub or using a portable sitz bath that fits over the toilet. If directed, apply ice to the affected area. Using ice packs between sitz baths may be helpful. Put ice in a plastic bag. Place a towel between your skin and the bag. Leave the ice on for 20 minutes, 2-3 times a day. General instructions Take over-the-counter and prescription medicines only as told by your health care provider. Use medicated creams or suppositories as told. Get regular exercise.   Ask your health care provider how much and what kind of exercise is best for you. In general, you should do moderate exercise for at least 30 minutes on most days of the week (150 minutes each week). This can include activities such as walking, biking, or yoga. Go to the bathroom when you have  the urge to have a bowel movement. Do not wait. Avoid straining to have bowel movements. Keep the anal area dry and clean. Use wet toilet paper or moist towelettes after a bowel movement. Do not sit on the toilet for long periods of time. This increases blood pooling and pain. Keep all follow-up visits as told by your health care provider. This is important. Contact a health care provider if you have: Increasing pain and swelling that are not controlled by treatment or medicine. Difficulty having a bowel movement, or you are unable to have a bowel movement. Pain or inflammation outside the area of the hemorrhoids. Get help right away if you have: Uncontrolled bleeding from your rectum. Summary Hemorrhoids are swollen veins in and around the rectum or anus. Most hemorrhoids can be managed with home treatments such as diet and lifestyle changes. Taking warm sitz baths can help ease pain and discomfort. In severe cases, procedures or surgery can be done to shrink or remove the hemorrhoids. This information is not intended to replace advice given to you by your health care provider. Make sure you discuss any questions you have with your health care provider. Document Revised: 03/19/2021 Document Reviewed: 03/19/2021 Elsevier Patient Education  [place] stands for fermentable oligosaccharides, disaccharides, monosaccharides, and polyols. These are sugars that are hard for some people to digest. A low-FODMAP eating plan may help some people who have irritable bowel syndrome (IBS) and certain other bowel (intestinal) diseases to manage their symptoms. This meal plan can be complicated to follow. Work with a diet and nutrition specialist (dietitian) to make a low-FODMAP eating plan that is right for you. A dietitian can help make sure that you get enough nutrition from this diet. What are tips for following this plan? Reading food labels Check labels for  hidden FODMAPs such as: High-fructose syrup. Honey. Agave. Natural fruit flavors. Onion or garlic powder. Choose low-FODMAP foods that contain 3-4 grams of fiber per serving. Check food labels for serving sizes. Eat only one serving at a time to make sure FODMAP levels stay low. Shopping Shop with a list of foods that are recommended on this diet and make a meal plan. Meal planning Follow a low-FODMAP eating plan for up to 6 weeks, or as told by your health care provider or dietitian. To follow the eating plan: Eliminate high-FODMAP foods from your diet completely. Choose only low-FODMAP foods to eat. You will do this for 2-6 weeks. Gradually reintroduce high-FODMAP foods into your diet one at a time. Most people should wait a few days before introducing the next new high-FODMAP food into their meal plan. Your dietitian can recommend how quickly you may reintroduce foods. Keep a daily record of what and how much you eat and drink. Make note of any symptoms that you have after eating. Review your daily record with a dietitian regularly to identify which foods you can eat and which foods you should avoid. General tips Drink enough fluid each day to keep your urine pale yellow. Avoid processed foods. These often have added sugar and may be high in FODMAPs. Avoid most dairy products, whole grains, and  sweeteners. Work with a dietitian to make sure you get enough fiber in your diet. Avoid high FODMAP foods at meals to manage symptoms. Recommended foods Fruits Bananas, oranges, tangerines, lemons, limes, blueberries, raspberries, strawberries, grapes, cantaloupe, honeydew melon, kiwi, papaya, passion fruit, and pineapple. Limited amounts of dried cranberries, banana chips, and shredded coconut. Vegetables Eggplant, zucchini, cucumber, peppers, green beans, bean sprouts, lettuce, arugula, kale, Swiss chard, spinach, collard greens, bok choy, summer squash, potato, and tomato. Limited amounts of  corn, carrot, and sweet potato. Green parts of scallions. Grains Gluten-free grains, such as rice, oats, buckwheat, quinoa, corn, polenta, and millet. Gluten-free pasta, bread, or cereal. Rice noodles. Corn tortillas. Meats and other proteins Unseasoned beef, pork, poultry, or fish. Eggs. Berniece Salines. Tofu (firm) and tempeh. Limited amounts of nuts and seeds, such as almonds, walnuts, [place] nuts, pecans, peanuts, nut butters, pumpkin seeds, chia seeds, and sunflower seeds. Dairy Lactose-free milk, yogurt, and kefir. Lactose-free cottage cheese and ice cream. Non-dairy milks, such as almond, coconut, hemp, and rice milk. Non-dairy yogurt. Limited amounts of goat cheese, brie, mozzarella, parmesan, swiss, and other hard cheeses. Fats and oils Butter-free spreads. Vegetable oils, such as olive, canola, and sunflower oil. Seasoning and other foods Artificial sweeteners with names that do not end in "ol," such as aspartame, saccharine, and stevia. Maple syrup, white table sugar, raw sugar, brown sugar, and molasses. Mayonnaise, soy sauce, and tamari. Fresh basil, coriander, parsley, rosemary, and thyme. Beverages Water and mineral water. Sugar-sweetened soft drinks. Small amounts of orange juice or cranberry juice. Black and green tea. Most dry wines. Coffee. The items listed above may not be a complete list of foods and beverages you can eat. Contact a dietitian for more information. Foods to avoid Fruits Fresh, dried, and juiced forms of apple, pear, watermelon, peach, plum, cherries, apricots, blackberries, boysenberries, figs, nectarines, and mango. Avocado. Vegetables Chicory root, artichoke, asparagus, cabbage, snow peas, Brussels sprouts, broccoli, sugar snap peas, mushrooms, celery, and cauliflower. Onions, garlic, leeks, and the white part of scallions. Grains Wheat, including kamut, durum, and semolina. Barley and bulgur. Couscous. Wheat-based cereals. Wheat noodles, bread, crackers, and  pastries. Meats and other proteins Fried or fatty meat. Sausage. Cashews and pistachios. Soybeans, baked beans, black beans, chickpeas, kidney beans, fava beans, navy beans, lentils, black-eyed peas, and split peas. Dairy Milk, yogurt, ice cream, and soft cheese. Cream and sour cream. Milk-based sauces. Custard. Buttermilk. Soy milk. Seasoning and other foods Any sugar-free gum or candy. Foods that contain artificial sweeteners such as sorbitol, mannitol, isomalt, or xylitol. Foods that contain honey, high-fructose corn syrup, or agave. Bouillon, vegetable stock, beef stock, and chicken stock. Garlic and onion powder. Condiments made with onion, such as hummus, chutney, pickles, relish, salad dressing, and salsa. Tomato paste. Beverages Chicory-based drinks. Coffee substitutes. Chamomile tea. Fennel tea. Sweet or fortified wines such as port or sherry. Diet soft drinks made with isomalt, mannitol, maltitol, sorbitol, or xylitol. Apple, pear, and mango juice. Juices with high-fructose corn syrup. The items listed above may not be a complete list of foods and beverages you should avoid. Contact a dietitian for more information. Summary FODMAP stands for fermentable oligosaccharides, disaccharides, monosaccharides, and polyols. These are sugars that are hard for some people to digest. A low-FODMAP eating plan is a short-term diet that helps to ease symptoms of certain bowel diseases. The eating plan usually lasts up to 6 weeks. After that, high-FODMAP foods are reintroduced gradually and one at a time. This can help you find out which foods  may be causing symptoms. A low-FODMAP eating plan can be complicated. It is best to work with a dietitian who has experience with this type of plan. This information is not intended to replace advice given to you by your health care provider. Make sure you discuss any questions you have with your health care provider. Document Revised: 01/25/2020 Document Reviewed:  01/25/2020 Elsevier Patient Education  [place].

## 2022-03-30 LAB — CALPROTECTIN, FECAL: Calprotectin, Fecal: 153 ug/g — ABNORMAL HIGH (ref 0–120)

## 2022-04-01 ENCOUNTER — Telehealth: Payer: Self-pay

## 2022-04-01 NOTE — Telephone Encounter (Signed)
Patient called stating that she wanted to know if her Doreene Nest was approved by her insurance. I told her that I would check tomorrow and that I would be letting her know.

## 2022-04-07 ENCOUNTER — Encounter: Payer: Self-pay | Admitting: Nurse Practitioner

## 2022-04-07 ENCOUNTER — Ambulatory Visit (INDEPENDENT_AMBULATORY_CARE_PROVIDER_SITE_OTHER): Payer: Self-pay | Admitting: Nurse Practitioner

## 2022-04-07 VITALS — BP 110/72 | Temp 98.1°F | Ht 66.0 in | Wt 147.0 lb

## 2022-04-07 DIAGNOSIS — J029 Acute pharyngitis, unspecified: Secondary | ICD-10-CM

## 2022-04-07 DIAGNOSIS — R21 Rash and other nonspecific skin eruption: Secondary | ICD-10-CM | POA: Insufficient documentation

## 2022-04-07 MED ORDER — NYSTATIN 100000 UNIT/GM EX POWD: 1.0000 | Freq: Three times a day (TID) | CUTANEOUS | 0 refills | Status: DC

## 2022-04-07 MED ORDER — LIDOCAINE VISCOUS HCL 2 % MT SOLN: 15.0000 mL | OROMUCOSAL | 0 refills | Status: DC | PRN

## 2022-04-07 MED ORDER — PREDNISONE 10 MG PO TABS: ORAL_TABLET | ORAL | 0 refills | Status: DC

## 2022-04-07 MED ORDER — TRIAMCINOLONE ACETONIDE 0.1 % EX CREA: 1.0000 | TOPICAL_CREAM | Freq: Two times a day (BID) | CUTANEOUS | 0 refills | Status: DC

## 2022-04-07 NOTE — Progress Notes (Deleted)
   Wt 147 lb (66.7 kg)   LMP 04/21/2014 (Approximate)   BMI 23.73 kg/m    Subjective:    Patient ID: Frances Davidson, female    DOB: 04-Nov-1960, 61 y.o.   MRN: [id number]  HPI: Frances Davidson is a 61 y.o. female  Chief Complaint  Patient presents with   Sore Throat    Patient is here for Sore Throat. Patient says she is having soreness underneath her throat. Patient says she they started about 3 days. Patient says she has gnarled with salt water. Patient says it seems more so in the back of her throat.    Urticaria    Patient says she has noticed constant rash/hives that comes and goes. Patient says she tried over the counter medications for hive and Benadryl.    UPPER RESPIRATORY TRACT INFECTION Worst symptom: Fever: {Blank single:19197::"yes","no"} Cough: {Blank single:19197::"yes","no"} Shortness of breath: {Blank single:19197::"yes","no"} Wheezing: {Blank single:19197::"yes","no"} Chest pain: {Blank single:19197::"yes","no","yes, with cough"} Chest tightness: {Blank single:19197::"yes","no"} Chest congestion: {Blank single:19197::"yes","no"} Nasal congestion: {Blank single:19197::"yes","no"} Runny nose: {Blank single:19197::"yes","no"} Post nasal drip: {Blank single:19197::"yes","no"} Sneezing: {Blank single:19197::"yes","no"} Sore throat: {Blank single:19197::"yes","no"} Swollen glands: {Blank single:19197::"yes","no"} Sinus pressure: {Blank single:19197::"yes","no"} Headache: {Blank single:19197::"yes","no"} Face pain: {Blank single:19197::"yes","no"} Toothache: {Blank single:19197::"yes","no"} Ear pain: {Blank single:19197::"yes","no"} {Blank single:19197::""right","left", "bilateral"} Ear pressure: {Blank single:19197::"yes","no"} {Blank single:19197::""right","left", "bilateral"} Eyes red/itching:{Blank single:19197::"yes","no"} Eye drainage/crusting: {Blank single:19197::"yes","no"}  Vomiting: {Blank single:19197::"yes","no"} Rash: {Blank  single:19197::"yes","no"} Fatigue: {Blank single:19197::"yes","no"} Sick contacts: {Blank single:19197::"yes","no"} Strep contacts: {Blank single:19197::"yes","no"}  Context: {Blank multiple:19196::"better","worse","stable","fluctuating"} Recurrent sinusitis: {Blank single:19197::"yes","no"} Relief with OTC cold/cough medications: {Blank single:19197::"yes","no"}  Treatments attempted: {Blank multiple:19196::"none","cold/sinus","mucinex","anti-histamine","pseudoephedrine","cough syrup","antibiotics"}    RASH Duration:  {Blank single:19197::"chronic","days","weeks","months"}  Location: {Blank multiple:19196::"generalized","trunk","face","hands","arms","legs","groin"}  Itching: {Blank single:19197::"yes","no"} Burning: {Blank single:19197::"yes","no"} Redness: {Blank single:19197::"yes","no"} Oozing: {Blank single:19197::"yes","no"} Scaling: {Blank single:19197::"yes","no"} Blisters: {Blank single:19197::"yes","no"} Painful: {Blank single:19197::"yes","no"} Fevers: {Blank single:19197::"yes","no"} Change in detergents/soaps/personal care products: {Blank single:19197::"yes","no"} Recent illness: {Blank single:19197::"yes","no"} Recent travel:{Blank single:19197::"yes","no"} History of same: {Blank single:19197::"yes","no"} Context: {Blank multiple:19196::"better","worse","stable","fluctuating","contacts with the same"} Alleviating factors: {Blank multiple:19196::"hydrocortisone cream","benadryl","lotion/moisturizer","nothing"} Treatments attempted:{Blank multiple:19196::"hydrocortisone cream","benadryl","OTC anit-fungal","lotion/moisturizer","nothing"} Shortness of breath: {Blank single:19197::"yes","no"}  Throat/tongue swelling: {Blank single:19197::"yes","no"} Myalgias/arthralgias: {Blank single:19197::"yes","no"}   Relevant past medical, surgical, family and social history reviewed and updated as indicated. Interim medical history since our last visit reviewed. Allergies and  medications reviewed and updated.  Review of Systems  Per HPI unless specifically indicated above     Objective:    Wt 147 lb (66.7 kg)   LMP 04/21/2014 (Approximate)   BMI 23.73 kg/m   Wt Readings from Last 3 Encounters:  04/07/22 147 lb (66.7 kg)  03/19/22 148 lb 9.6 oz (67.4 kg)  12/22/21 153 lb (69.4 kg)    Physical Exam  Results for orders placed or performed in visit on 03/19/22  Calprotectin, Fecal   Specimen: Stool  Result Value Ref Range   Calprotectin, Fecal 153 (H) 0 - 120 ug/g      Assessment & Plan:   Problem List Items Addressed This Visit   None    Follow up plan: No follow-ups on file.

## 2022-04-07 NOTE — Assessment & Plan Note (Addendum)
Ongoing for 2 years. Scattered lesions to arms, legs and trunk. Urticarial in nature. Prednisone taper sent in. Triamcinolone cream to help with itching. Nystatin powder for underneath her breasts, erythema that appears to be yeast. Labs today: ANA, CBC, CBC. Referral to Dermatology.

## 2022-04-07 NOTE — Patient Instructions (Signed)
Rash, Adult  A rash is a change in the color of your skin. A rash can also change the way your skin feels. There are many different conditions and factors that can cause a rash. Follow these instructions at home: The goal of treatment is to stop the itching and keep the rash from spreading. Watch for any changes in your symptoms. Let your doctor know about them. Follow these instructions to help with your condition: Medicine Take or apply over-the-counter and prescription medicines only as told by your doctor. These may include medicines: To treat red or swollen skin (corticosteroid creams). To treat itching. To treat an allergy (oral antihistamines). To treat very bad symptoms (oral corticosteroids).  Skin care Put cool cloths (compresses) on the affected areas. Do not scratch or rub your skin. Avoid covering the rash. Make sure that the rash is exposed to air as much as possible. Managing itching and discomfort Avoid hot showers or baths. These can make itching worse. A cold shower may help. Try taking a bath with: Epsom salts. You can get these at your local pharmacy or grocery store. Follow the instructions on the package. Baking soda. Pour a small amount into the bath as told by your doctor. Colloidal oatmeal. You can get this at your local pharmacy or grocery store. Follow the instructions on the package. Try putting baking soda paste onto your skin. Stir water into baking soda until it gets like a paste. Try putting on a lotion that relieves itchiness (calamine lotion). Keep cool and out of the sun. Sweating and being hot can make itching worse. General instructions  Rest as needed. Drink enough fluid to keep your pee (urine) pale yellow. Wear loose-fitting clothing. Avoid scented soaps, detergents, and perfumes. Use gentle soaps, detergents, perfumes, and other cosmetic products. Avoid anything that causes your rash. Keep a journal to help track what causes your rash. Write  down: What you eat. What cosmetic products you use. What you drink. What you wear. This includes jewelry. Keep all follow-up visits as told by your doctor. This is important. Contact a doctor if: You sweat at night. You lose weight. You pee (urinate) more than normal. You pee less than normal, or you notice that your pee is a darker color than normal. You feel weak. You throw up (vomit). Your skin or the whites of your eyes look yellow (jaundice). Your skin: Tingles. Is numb. Your rash: Does not go away after a few days. Gets worse. You are: More thirsty than normal. More tired than normal. You have: New symptoms. Pain in your belly (abdomen). A fever. Watery poop (diarrhea). Get help right away if: You have a fever and your symptoms suddenly get worse. You start to feel mixed up (confused). You have a very bad headache or a stiff neck. You have very bad joint pains or stiffness. You have jerky movements that you cannot control (seizure). Your rash covers all or most of your body. The rash may or may not be painful. You have blisters that: Are on top of the rash. Grow larger. Grow together. Are painful. Are inside your nose or mouth. You have a rash that: Looks like purple pinprick-sized spots all over your body. Has a "bull's eye" or looks like a target. Is red and painful, causes your skin to peel, and is not from being in the sun too long. Summary A rash is a change in the color of your skin. A rash can also change the way your skin   feels. The goal of treatment is to stop the itching and keep the rash from spreading. Take or apply over-the-counter and prescription medicines only as told by your doctor. Contact a doctor if you have new symptoms or symptoms that get worse. Keep all follow-up visits as told by your doctor. This is important. This information is not intended to replace advice given to you by your health care provider. Make sure you discuss any  questions you have with your health care provider. Document Revised: 06/19/2021 Document Reviewed: 06/19/2021 Elsevier Patient Education  2023 [employer]  

## 2022-04-07 NOTE — Progress Notes (Signed)
BP 110/72   Temp 98.1 F (36.7 C) (Oral)   Ht '5\' 6"'$  (1.676 m)   Wt 147 lb (66.7 kg)   LMP 04/21/2014 (Approximate)   SpO2 98%   BMI 23.73 kg/m    Subjective:    Patient ID: Frances Davidson, female    DOB: 25-Jul-1961, 61 y.o.   MRN: [id number]  HPI: Frances Davidson is a 61 y.o. female  Chief Complaint  Patient presents with   Sore Throat    Patient is here for Sore Throat. Patient says she is having soreness underneath her throat. Patient says she they started about 3 days. Patient says she has gnarled with salt water. Patient says it seems more so in the back of her throat.    Urticaria    Patient says she has noticed constant rash/hives that comes and goes. Patient says she tried over the counter medications for hive and Benadryl.    NOTE WRITTEN BY FNP STUDENT.  ASSESSMENT AND PLAN OF CARE REVIEWED WITH STUDENT, AGREE WITH ABOVE FINDINGS AND PLAN.   UPPER RESPIRATORY TRACT INFECTION Sore throat for 3 days. She has tried Claritin and salt water gargles at home.  Worst symptom: sore throat  Fever: no Cough: no Shortness of breath: no Wheezing: no Chest pain: no Chest tightness: no Chest congestion: no Nasal congestion: no Runny nose: no Post nasal drip: no Sneezing: no Sore throat: yes Swollen glands: no Sinus pressure: yes Headache: no Face pain: no Toothache: no Ear pain: no  Ear pressure: no  Eyes red/itching:yes Eye drainage/crusting: no  Vomiting: no Rash: yes Fatigue: yes Sick contacts: no Strep contacts: no  Context: worse Recurrent sinusitis: no Relief with OTC cold/cough medications: no  Treatments attempted: anti-histamine    RASH Started 3 weeks ago on arms and spread over the whole body. This has happened in the past leaving scars, has been on and off issues for >2 years. She has tried OTC benadryl and cortisone cream without benefit.  Duration:  weeks  Location: trunk, arms, and legs  Itching: yes Burning: no Redness: yes Oozing:  no Scaling: no Blisters: no Painful: yes Fevers: no Change in detergents/soaps/personal care products: no Recent illness: no Recent travel:no History of same: yes Context: worse Alleviating factors: hydrocortisone cream, benadryl, and lotion/moisturizer Treatments attempted: Calamine lotion  Shortness of breath: no  Throat/tongue swelling: no Myalgias/arthralgias: no   Relevant past medical, surgical, family and social history reviewed and updated as indicated. Interim medical history since our last visit reviewed. Allergies and medications reviewed and updated.  Review of Systems  Constitutional:  Negative for chills, fatigue and fever.  HENT:  Positive for sore throat. Negative for congestion, ear pain, sinus pressure, sinus pain and sneezing.   Eyes:  Negative for pain, discharge and redness.  Respiratory:  Negative for cough, chest tightness and shortness of breath.   Cardiovascular:  Negative for chest pain and palpitations.  Gastrointestinal:  Negative for diarrhea, nausea and vomiting.  Allergic/Immunologic: Negative for environmental allergies and food allergies.  Neurological: Negative.   Psychiatric/Behavioral: Negative.      Per HPI unless specifically indicated above     Objective:    BP 110/72   Temp 98.1 F (36.7 C) (Oral)   Ht '5\' 6"'$  (1.676 m)   Wt 147 lb (66.7 kg)   LMP 04/21/2014 (Approximate)   SpO2 98%   BMI 23.73 kg/m   Wt Readings from Last 3 Encounters:  04/07/22 147 lb (66.7 kg)  03/19/22 148 lb 9.6  oz (67.4 kg)  12/22/21 153 lb (69.4 kg)    Physical Exam Vitals reviewed.  Constitutional:      General: She is not in acute distress.    Appearance: She is not toxic-appearing.  HENT:     Head: Normocephalic.     Right Ear: Hearing, tympanic membrane, ear canal and external ear normal.     Left Ear: Hearing, tympanic membrane, ear canal and external ear normal.     Nose: Nose normal. No congestion or rhinorrhea.     Mouth/Throat:      Mouth: Mucous membranes are moist.     Pharynx: Uvula midline. Oropharyngeal exudate and posterior oropharyngeal erythema present.     Tonsils: Tonsillar exudate present.  Eyes:     General: Lids are normal.     Extraocular Movements: Extraocular movements intact.     Conjunctiva/sclera: Conjunctivae normal.  Neck:     Thyroid: No thyromegaly or thyroid tenderness.  Cardiovascular:     Rate and Rhythm: Normal rate and regular rhythm.     Pulses: Normal pulses.     Heart sounds: Normal heart sounds. No murmur heard. Pulmonary:     Effort: Pulmonary effort is normal. No accessory muscle usage.     Breath sounds: Normal breath sounds. No stridor.  Lymphadenopathy:     Head:     Right side of head: No submental, submandibular, tonsillar, preauricular or posterior auricular adenopathy.     Left side of head: No submental, submandibular, tonsillar, preauricular or posterior auricular adenopathy.  Skin:    Findings: Rash present. Rash is urticarial.     Comments: Small, scattered lesions to arms, legs and trunk. She has some old scars and some raised, red lesions. Also has erythema under both breasts that has a yeast-like appearance.  Neurological:     Mental Status: She is alert.  Psychiatric:        Attention and Perception: Attention normal.        Mood and Affect: Mood normal.        Speech: Speech normal.        Behavior: Behavior normal.    Results for orders placed or performed in visit on 04/07/22  Rapid Strep Screen ([facility] ONLY)   Specimen: Other   Other  Result Value Ref Range   Strep Gp A Ag, IA W/Reflex Negative Negative  Culture, Group A Strep   Other  Result Value Ref Range   Strep A Culture WILL FOLLOW       Assessment & Plan:   Problem List Items Addressed This Visit       Musculoskeletal and Integument   Rash    Ongoing for 2 years. Scattered lesions to arms, legs and trunk. Urticarial in nature. Prednisone taper sent in. Triamcinolone cream to  help with itching. Nystatin powder for underneath her breasts, erythema that appears to be yeast. Labs today: ANA, CBC, CBC. Referral to Dermatology.       Relevant Medications   predniSONE (DELTASONE) 10 MG tablet   triamcinolone cream (KENALOG) 0.1 %   nystatin (MYCOSTATIN/NYSTOP) powder   Other Relevant Orders   ANA 12 Plus Profile (RDL)   CBC with Differential/Platelet   Comprehensive metabolic panel   Ambulatory referral to Dermatology   Other Visit Diagnoses     Sore throat    -  Primary   Acute for 3 days, rapid strep negative today. Viscous lidocaine to help with discomfort.    Relevant Orders   Rapid Strep  Screen ([facility] ONLY) (Completed)        Follow up plan: Return in about 2 weeks (around 04/21/2022) for RASH.

## 2022-04-08 NOTE — Progress Notes (Signed)
Contacted via MyChart   Good morning Frances Davidson, some of your labs have returned, still waiting on one: - Kidney function, creatinine and eGFR, remains normal. Liver function, AST and ALT, is showing some elevations -- higher then previous mild elevations.  We should recheck these at next visit and recommend cutting back on any alcohol or Tylenol use if present. - CBC shows no anemia or infection present.  Any questions? Keep being amazing!!  Thank you for allowing me to participate in your care.  I appreciate you. Kindest regards, Jolene 

## 2022-04-09 ENCOUNTER — Encounter: Payer: Self-pay | Admitting: Nurse Practitioner

## 2022-04-09 DIAGNOSIS — R21 Rash and other nonspecific skin eruption: Secondary | ICD-10-CM

## 2022-04-09 DIAGNOSIS — R768 Other specified abnormal immunological findings in serum: Secondary | ICD-10-CM

## 2022-04-09 LAB — RAPID STREP SCREEN (MED CTR MEBANE ONLY): Strep Gp A Ag, IA W/Reflex: NEGATIVE

## 2022-04-09 LAB — CULTURE, GROUP A STREP: Strep A Culture: NEGATIVE

## 2022-04-09 MED ORDER — NYSTATIN 100000 UNIT/GM EX POWD: 1.0000 | Freq: Three times a day (TID) | CUTANEOUS | 0 refills | Status: DC

## 2022-04-09 MED ORDER — TRIAMCINOLONE ACETONIDE 0.1 % EX CREA: 1.0000 | TOPICAL_CREAM | Freq: Two times a day (BID) | CUTANEOUS | 4 refills | Status: DC

## 2022-04-14 NOTE — Progress Notes (Signed)
Contacted via MyChart   Good morning Frances Davidson, your ANA did return positive, although still waiting on final results for this.  However, there are some elevations in some of the pattern ratios so far.  I would like to get you into rheumatology to further assess as this could go along with your random rashes for some time and joint pains.  Would you be okay with referral to rheumatology?  Let me know here and I can send this referral once I hear from you.

## 2022-04-15 ENCOUNTER — Encounter: Payer: Self-pay | Admitting: Nurse Practitioner

## 2022-04-15 DIAGNOSIS — R768 Other specified abnormal immunological findings in serum: Secondary | ICD-10-CM | POA: Insufficient documentation

## 2022-04-15 NOTE — Progress Notes (Signed)
Can you check with patient and see if she received below message from me: Good morning Neoma Laming, your ANA did return positive, although still waiting on final results for this.  However, there are some elevations in some of the pattern ratios so far.  I would like to get you into rheumatology to further assess as this could go along with your random rashes for some time and joint pains.  Would you be okay with referral to rheumatology?  Let me know here and I can send this referral once I hear from you.

## 2022-04-16 MED ORDER — TRIAMCINOLONE ACETONIDE 0.1 % EX CREA: 1.0000 | TOPICAL_CREAM | Freq: Two times a day (BID) | CUTANEOUS | 4 refills | Status: DC

## 2022-04-16 NOTE — Addendum Note (Signed)
Addended by: Marnee Guarneri T on: 04/16/2022 11:02 AM   Modules accepted: Orders

## 2022-04-16 NOTE — Addendum Note (Signed)
Addended by: Marnee Guarneri T on: 04/16/2022 11:20 AM   Modules accepted: Orders

## 2022-04-17 LAB — COMPREHENSIVE METABOLIC PANEL
ALT: 60 IU/L — ABNORMAL HIGH (ref 0–32)
AST: 101 IU/L — ABNORMAL HIGH (ref 0–40)
Albumin/Globulin Ratio: 1.6 (ref 1.2–2.2)
Albumin: 4.5 g/dL (ref 3.9–4.9)
Alkaline Phosphatase: 118 IU/L (ref 44–121)
BUN/Creatinine Ratio: 8 — ABNORMAL LOW (ref 12–28)
BUN: 6 mg/dL — ABNORMAL LOW (ref 8–27)
Bilirubin Total: 0.5 mg/dL (ref 0.0–1.2)
CO2: 21 mmol/L (ref 20–29)
Calcium: 9.6 mg/dL (ref 8.7–10.3)
Chloride: 99 mmol/L (ref 96–106)
Creatinine, Ser: 0.78 mg/dL (ref 0.57–1.00)
Globulin, Total: 2.8 g/dL (ref 1.5–4.5)
Glucose: 86 mg/dL (ref 70–99)
Potassium: 3.7 mmol/L (ref 3.5–5.2)
Sodium: 139 mmol/L (ref 134–144)
Total Protein: 7.3 g/dL (ref 6.0–8.5)
eGFR: 85 mL/min/{1.73_m2} (ref >59)

## 2022-04-17 LAB — ANA 12 PLUS PROFILE, POSITIVE
Anti-CCP Ab, IgG & IgA (RDL): <20 Units (ref <20)
Anti-Cardiolipin Ab, IgA (RDL): <12 APL U/mL (ref <12)
Anti-Cardiolipin Ab, IgG (RDL): <15 GPL U/mL (ref <15)
Anti-Cardiolipin Ab, IgM (RDL): <13 MPL U/mL (ref <13)
Anti-Centromere Ab (RDL): <1:40 {titer}
Anti-Chromatin Ab, IgG (RDL): <20 Units (ref <20)
Anti-La (SS-B) Ab (RDL): 25 Units — ABNORMAL HIGH (ref <20)
Anti-Ro (SS-A) Ab (RDL): 108 Units — ABNORMAL HIGH (ref <20)
Anti-Scl-70 Ab (RDL): <20 Units (ref <20)
Anti-Sm Ab (RDL): <20 Units (ref <20)
Anti-TPO Ab (RDL): <9 IU/mL (ref <9.0)
Anti-U1 RNP Ab (RDL): <20 Units (ref <20)
Anti-dsDNA Ab by Farr(RDL): <8 IU/mL (ref <8.0)
C3 Complement (RDL): 203 mg/dL — ABNORMAL HIGH (ref 82–167)
C4 Complement (RDL): 39 mg/dL (ref 14–44)
Midbody Pattern: 1:640 {titer} — ABNORMAL HIGH
Nuclear Dot Pattern: >1:1280 {titer} — AB
Rheumatoid Factor by Turb RDL: <14 IU/mL (ref <14)
Speckled Pattern: 1:640 {titer} — ABNORMAL HIGH

## 2022-04-17 LAB — CBC WITH DIFFERENTIAL/PLATELET
Basophils Absolute: 0 10*3/uL (ref 0.0–0.2)
Basos: 1 %
EOS (ABSOLUTE): 0.2 10*3/uL (ref 0.0–0.4)
Eos: 4 %
Hematocrit: 39 % (ref 34.0–46.6)
Hemoglobin: 13 g/dL (ref 11.1–15.9)
Immature Grans (Abs): 0 10*3/uL (ref 0.0–0.1)
Immature Granulocytes: 0 %
Lymphocytes Absolute: 0.9 10*3/uL (ref 0.7–3.1)
Lymphs: 19 %
MCH: 31.9 pg (ref 26.6–33.0)
MCHC: 33.3 g/dL (ref 31.5–35.7)
MCV: 96 fL (ref 79–97)
Monocytes Absolute: 0.3 10*3/uL (ref 0.1–0.9)
Monocytes: 7 %
Neutrophils Absolute: 3.2 10*3/uL (ref 1.4–7.0)
Neutrophils: 69 %
Platelets: 192 10*3/uL (ref 150–450)
RBC: 4.08 x10E6/uL (ref 3.77–5.28)
RDW: 13.3 % (ref 11.7–15.4)
WBC: 4.6 10*3/uL (ref 3.4–10.8)

## 2022-04-17 LAB — ANA 12 PLUS PROFILE (RDL): Anti-Nuclear Ab by IFA (RDL): POSITIVE — AB

## 2022-04-23 ENCOUNTER — Ambulatory Visit: Payer: Self-pay | Admitting: Gastroenterology

## 2022-04-23 ENCOUNTER — Encounter: Payer: Self-pay | Admitting: Gastroenterology

## 2022-04-23 ENCOUNTER — Ambulatory Visit (INDEPENDENT_AMBULATORY_CARE_PROVIDER_SITE_OTHER): Payer: Medicaid Other | Admitting: Gastroenterology

## 2022-04-23 VITALS — BP 102/78 | HR 78 | Temp 98.5°F | Wt 148.2 lb

## 2022-04-23 DIAGNOSIS — K580 Irritable bowel syndrome with diarrhea: Secondary | ICD-10-CM

## 2022-04-23 MED ORDER — RIFAXIMIN 550 MG PO TABS: 550.0000 mg | ORAL_TABLET | Freq: Two times a day (BID) | ORAL | 0 refills | Status: DC

## 2022-04-23 MED ORDER — RIFAXIMIN 550 MG PO TABS: 550.0000 mg | ORAL_TABLET | Freq: Three times a day (TID) | ORAL | 0 refills | Status: DC

## 2022-04-23 NOTE — Addendum Note (Signed)
Addended by: Wayna Chalet on: 04/23/2022 04:25 PM   Modules accepted: Orders

## 2022-04-23 NOTE — Progress Notes (Signed)
Jonathon Bellows MD, MRCP(U.K) [address]  [place]  [place], [place] [address]  Main: [phone]  Fax: [phone]   Primary Care Physician: Jon Billings, NP  Primary Gastroenterologist:  Dr. Jonathon Bellows   Chief Complaint  Patient presents with   Abdominal Pain    HPI: Frances Davidson is a 62 y.o. female Summary of history :   This is a patient was previously seen Dr. Bonna Gains in October 2022 for 5-year history of loose stools 2-4 loose bowel movements a day no blood in the stool associated with bloating fatigue.  Never had an EGD or colonoscopy.  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  Was also noted to have elevated transaminases.  Plan at last visit was to proceed with EGD and colonoscopy, stool tests and liver work-up. No stool tests were obtained.  Lab work in October 2022 showed mild elevation of AST at 66 b, not immune to hepatitis A and B.  Autoimmune work-up was negative as well as viral hepatitis work-up.  Right upper quadrant ultrasound in July 2022 showed hepatic steatosis.  She did not follow-up after.  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 pylori no evidence of celiac disease either   Interval history 03/19/2022-04/23/2022   At her last visit she was seen for diarrhea which is likely secondary to IBS I thought I had prescribed her Xifaxan for it but she says she never got the prescription and never took the medication and continues to have diarrhea is also prescribed some dicyclomine which did not help.  No issues with hemorrhoids presently.     Current Outpatient Medications  Medication Sig Dispense Refill   atorvastatin  (LIPITOR) 20 MG tablet Take 1 tablet (20 mg total) by mouth daily. 90 tablet 1   Calcium Carbonate-Vitamin D (CALCIUM-VITAMIN D) 600-3.125 MG-MCG TABS Take 1 capsule by mouth daily.     dicyclomine (BENTYL) 10 MG capsule Take 1 capsule (10 mg total) by mouth in the morning, at noon, in the evening, and at bedtime. 120 capsule 0   Multiple Vitamin (MULTIVITAMIN) capsule Take 1 capsule by mouth daily.     nystatin (MYCOSTATIN/NYSTOP) powder Apply 1 Application topically 3 (three) times daily. 15 g 0   omeprazole (PRILOSEC OTC) 20 MG tablet Take 1 tablet (20 mg total) by mouth daily. 90 tablet 1   predniSONE (DELTASONE) 10 MG tablet Take 6 tablets by mouth daily for 2 days, then reduce by 1 tablet every 2 days until gone 42 tablet 0   triamcinolone cream (KENALOG) 0.1 % Apply 1 Application topically 2 (two) times daily. 80 g 4   No current facility-administered medications for this visit.    Allergies as of 04/23/2022   (No Known Allergies)    ROS:  General: Negative for anorexia, weight loss, fever, chills, fatigue, weakness. ENT: Negative for hoarseness, difficulty swallowing , nasal congestion. CV: Negative for chest pain, angina, palpitations, dyspnea on exertion, peripheral edema.  Respiratory: Negative for dyspnea at rest, dyspnea on exertion, cough, sputum, wheezing.  GI: See history of present illness. GU:  Negative for dysuria, hematuria, urinary incontinence, urinary frequency, nocturnal urination.  Endo: Negative for unusual weight change.    Physical Examination:  BP 102/78   Pulse 78   Temp 98.5 F (36.9 C) (Oral)   Wt 148 lb 3.2 oz (67.2 kg)   LMP 04/21/2014 (Approximate)   BMI 23.92 kg/m   General: Well-nourished, well-developed in no acute distress.  Eyes: No icterus. Conjunctivae pink. Mouth: Oropharyngeal mucosa moist and pink , no lesions erythema or exudate. Neuro: Alert and oriented x 3.  Grossly intact. Skin: Warm and dry, no jaundice.   Psych: Alert and  cooperative, normal mood and affect.   Imaging Studies: No results found.  Assessment and Plan:   Frances Davidson is a 61 y.o. y/o female  here to see me as a follow-up for IBS diarrhea and hemorrhoids.     Plan  Trial of Xifaxan 550 mg 3 times daily for 2 weeks.  If negative may need to continue Imodium Dr.  Dr Jonathon Bellows  MD,MRCP [facility]) Follow up in as needed

## 2022-04-23 NOTE — Addendum Note (Signed)
Addended by: Wayna Chalet on: 04/23/2022 04:08 PM   Modules accepted: Orders

## 2022-04-24 ENCOUNTER — Encounter: Payer: Self-pay | Admitting: Nurse Practitioner

## 2022-04-24 ENCOUNTER — Ambulatory Visit (INDEPENDENT_AMBULATORY_CARE_PROVIDER_SITE_OTHER): Payer: Self-pay | Admitting: Nurse Practitioner

## 2022-04-24 VITALS — BP 113/81 | HR 105 | Temp 98.3°F | Ht 66.0 in | Wt 147.8 lb

## 2022-04-24 DIAGNOSIS — R21 Rash and other nonspecific skin eruption: Secondary | ICD-10-CM

## 2022-04-24 DIAGNOSIS — R768 Other specified abnormal immunological findings in serum: Secondary | ICD-10-CM

## 2022-04-24 MED ORDER — TRIAMCINOLONE ACETONIDE 0.1 % EX CREA: 1.0000 | TOPICAL_CREAM | Freq: Two times a day (BID) | CUTANEOUS | 4 refills | Status: DC

## 2022-04-24 NOTE — Assessment & Plan Note (Signed)
Ongoing. 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 She is going to make an appt with Rheumatology.  Information given for [facility] clinic Rheumatology.  Reviewed labs with patient today.  Refilled Triamcinalone cream.

## 2022-04-24 NOTE — Patient Instructions (Addendum)
[facility] Rheumat[facility]  [address], [place] [address] [phone] phone [phone] fax

## 2022-04-24 NOTE — Progress Notes (Signed)
BP 113/81   Pulse (!) 105   Temp 98.3 F (36.8 C) (Oral)   Ht _0  (1.676 m)   Wt 147 lb 12.8 oz (67 kg)   LMP 04/21/2014 (Approximate)   SpO2 96%   BMI 23.86 kg/m    Subjective:    Patient ID: Frances Davidson, female    DOB: [birth year], 61 y.o.   MRN: [id number]  HPI: Frances Davidson is a 61 y.o. female  Chief Complaint  Patient presents with   Rash    Patient is here for two week follow up on Rash. Patient says she is still itching and issues. Patient says she has completed two rounds of medication. Patient says she Dermatologist are scheduling out past the end of month.    RASH Patient states the rash has not improved.  Still ongoing after two round of prednisone. The triamcinalone dose help.  Plans to make an appt with Rheumatology.  Will see Dermatology at the end of the month.  Duration:  weeks  Location: trunk, arms, and legs  Itching: yes Burning: no Redness: yes Oozing: no Scaling: no Blisters: no Painful: yes Fevers: no Change in detergents/soaps/personal care products: no Recent illness: no Recent travel:no History of same: yes Context: worse Alleviating factors: hydrocortisone cream, benadryl, and lotion/moisturizer Treatments attempted: Calamine lotion  Shortness of breath: no  Throat/tongue swelling: no Myalgias/arthralgias: no   Relevant past medical, surgical, family and social history reviewed and updated as indicated. Interim medical history since our last visit reviewed. Allergies and medications reviewed and updated.  Review of Systems  Constitutional:  Negative for chills, fatigue and fever.  HENT:  Positive for sore throat. Negative for congestion, ear pain, sinus pressure, sinus pain and sneezing.   Eyes:  Negative for pain, discharge and redness.  Respiratory:  Negative for cough, chest tightness and shortness of breath.   Cardiovascular:  Negative for chest pain and palpitations.  Gastrointestinal:  Negative for diarrhea, nausea  and vomiting.  Allergic/Immunologic: Negative for environmental allergies and food allergies.  Neurological: Negative.   Psychiatric/Behavioral: Negative.      Per HPI unless specifically indicated above     Objective:    BP 113/81   Pulse (!) 105   Temp 98.3 F (36.8 C) (Oral)   Ht _1  (1.676 m)   Wt 147 lb 12.8 oz (67 kg)   LMP 04/21/2014 (Approximate)   SpO2 96%   BMI 23.86 kg/m   Wt Readings from Last 3 Encounters:  04/24/22 147 lb 12.8 oz (67 kg)  04/23/22 148 lb 3.2 oz (67.2 kg)  04/07/22 147 lb (66.7 kg)    Physical Exam Vitals reviewed.  Constitutional:      General: She is not in acute distress.    Appearance: She is not toxic-appearing.  HENT:     Head: Normocephalic.     Right Ear: Hearing, tympanic membrane, ear canal and external ear normal.     Left Ear: Hearing, tympanic membrane, ear canal and external ear normal.     Nose: Nose normal. No congestion or rhinorrhea.     Mouth/Throat:     Mouth: Mucous membranes are moist.     Pharynx: Uvula midline. Oropharyngeal exudate and posterior oropharyngeal erythema present.     Tonsils: Tonsillar exudate present.  Eyes:     General: Lids are normal.     Extraocular Movements: Extraocular movements intact.     Conjunctiva/sclera: Conjunctivae normal.  Neck:     Thyroid: No thyromegaly or thyroid  tenderness.  Cardiovascular:     Rate and Rhythm: Normal rate and regular rhythm.     Pulses: Normal pulses.     Heart sounds: Normal heart sounds. No murmur heard. Pulmonary:     Effort: Pulmonary effort is normal. No accessory muscle usage.     Breath sounds: Normal breath sounds. No stridor.  Lymphadenopathy:     Head:     Right side of head: No submental, submandibular, tonsillar, preauricular or posterior auricular adenopathy.     Left side of head: No submental, submandibular, tonsillar, preauricular or posterior auricular adenopathy.  Skin:    Findings: Rash present. Rash is urticarial.     Comments:  Small, scattered lesions to arms, legs and trunk. She has some old scars and some raised, red lesions. Also has erythema under both breasts that has a yeast-like appearance.  Neurological:     Mental Status: She is alert.  Psychiatric:        Attention and Perception: Attention normal.        Mood and Affect: Mood normal.        Speech: Speech normal.        Behavior: Behavior normal.    Results for orders placed or performed in visit on 04/07/22  Rapid Strep Screen ([facility] ONLY)   Specimen: Other   Other  Result Value Ref Range   Strep Gp A Ag, IA W/Reflex Negative Negative  Culture, Group A Strep   Other  Result Value Ref Range   Strep A Culture Negative   ANA 12 Plus Profile (RDL)  Result Value Ref Range   Anti-Nuclear Ab by IFA (RDL) Positive (A) Negative  CBC with Differential/Platelet  Result Value Ref Range   WBC 4.6 3.4 - 10.8 x10E3/uL   RBC 4.08 3.77 - 5.28 x10E6/uL   Hemoglobin 13.0 11.1 - 15.9 g/dL   Hematocrit 39.0 34.0 - 46.6 %   MCV 96 79 - 97 fL   MCH 31.9 26.6 - 33.0 pg   MCHC 33.3 31.5 - 35.7 g/dL   RDW 13.3 11.7 - 15.4 %   Platelets 192 150 - 450 x10E3/uL   Neutrophils 69 Not Estab. %   Lymphs 19 Not Estab. %   Monocytes 7 Not Estab. %   Eos 4 Not Estab. %   Basos 1 Not Estab. %   Neutrophils Absolute 3.2 1.4 - 7.0 x10E3/uL   Lymphocytes Absolute 0.9 0.7 - 3.1 x10E3/uL   Monocytes Absolute 0.3 0.1 - 0.9 x10E3/uL   EOS (ABSOLUTE) 0.2 0.0 - 0.4 x10E3/uL   Basophils Absolute 0.0 0.0 - 0.2 x10E3/uL   Immature Granulocytes 0 Not Estab. %   Immature Grans (Abs) 0.0 0.0 - 0.1 x10E3/uL  Comprehensive metabolic panel  Result Value Ref Range   Glucose 86 70 - 99 mg/dL   BUN 6 (L) 8 - 27 mg/dL   Creatinine, Ser 0.78 0.57 - 1.00 mg/dL   eGFR 85 >59 mL/min/1.73   BUN/Creatinine Ratio 8 (L) 12 - 28   Sodium 139 134 - 144 mmol/L   Potassium 3.7 3.5 - 5.2 mmol/L   Chloride 99 96 - 106 mmol/L   CO2 21 20 - 29 mmol/L   Calcium 9.6 8.7 - 10.3 mg/dL    Total Protein 7.3 6.0 - 8.5 g/dL   Albumin 4.5 3.9 - 4.9 g/dL   Globulin, Total 2.8 1.5 - 4.5 g/dL   Albumin/Globulin Ratio 1.6 1.2 - 2.2   Bilirubin Total 0.5 0.0 - 1.2 mg/dL  Alkaline Phosphatase 118 44 - 121 IU/L   AST 101 (H) 0 - 40 IU/L   ALT 60 (H) 0 - 32 IU/L  ANA 12 Plus Profile, Positive  Result Value Ref Range   Speckled Pattern 1:640 (H) <1:40   Midbody Pattern 1:640 (H) <1:40   Nuclear Dot Pattern >1:1280 (A) <1:40   Note: Comment    Anti-Centromere Ab (RDL) <1:40 <1:40   Anti-dsDNA Ab by Farr(RDL) <8.0 <8.0 IU/mL   Anti-Sm Ab (RDL) <20 <20 Units   Anti-U1 RNP Ab (RDL) <20 <20 Units   Anti-Ro (SS-A) Ab (RDL) 108 (H) <20 Units   Anti-La (SS-B) Ab (RDL) 25 (H) <20 Units   Anti-Scl-70 Ab (RDL) <20 <20 Units   Anti-Cardiolipin Ab, IgG (RDL) <15 <15 GPL U/mL   Anti-Cardiolipin Ab, IgA (RDL) <12 <12 APL U/mL   Anti-Cardiolipin Ab, IgM (RDL) <13 <13 MPL U/mL   C3 Complement (RDL) 203 (H) 82 - 167 mg/dL   C4 Complement (RDL) 39 14 - 44 mg/dL   Anti-TPO Ab (RDL) <9.0 <9.0 IU/mL   Anti-Chromatin Ab, IgG (RDL) <20 <20 Units   Anti-CCP Ab, IgG & IgA (RDL) <20 <20 Units   Rheumatoid Factor by Turb RDL <14 <14 IU/mL   ANA Plus 12 Interpretation Comment       Assessment & Plan:   Problem List Items Addressed This Visit       Musculoskeletal and Integument   Rash    Ongoing. 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 She is going to make an appt with Rheumatology.  Information given for [facility] clinic Rheumatology.  Reviewed labs with patient today.  Refilled Triamcinalone cream.        Relevant Medications   triamcinolone cream (KENALOG) 0.1 %     Other   Positive ANA (antinuclear antibody) - Primary   Relevant Orders   Ambulatory referral to Rheumatology     Follow up plan: Return if symptoms worsen or fail to improve.

## 2022-05-28 ENCOUNTER — Encounter: Payer: Self-pay | Admitting: Nurse Practitioner

## 2022-06-01 ENCOUNTER — Encounter: Payer: Self-pay | Admitting: Nurse Practitioner

## 2022-06-01 ENCOUNTER — Ambulatory Visit: Payer: Self-pay | Admitting: Nurse Practitioner

## 2022-06-01 VITALS — BP 105/82 | HR 89 | Temp 98.8°F | Wt 146.9 lb

## 2022-06-01 DIAGNOSIS — R21 Rash and other nonspecific skin eruption: Secondary | ICD-10-CM

## 2022-06-01 MED ORDER — TRIAMCINOLONE ACETONIDE 40 MG/ML IJ SUSP
40.0000 mg | Freq: Once | INTRAMUSCULAR | Status: AC
  Administered 2022-06-01: 40 mg via INTRAMUSCULAR

## 2022-06-01 MED ORDER — PREDNISONE 10 MG PO TABS: 10.0000 mg | ORAL_TABLET | Freq: Every day | ORAL | 0 refills | Status: DC

## 2022-06-01 NOTE — Assessment & Plan Note (Signed)
Chronic. Ongoing x 3 months. Will give triamcinolone in office today.  Will also give prednisone to start in two days.  Will try to get patient into Rheumatology sooner than two months.

## 2022-06-01 NOTE — Progress Notes (Signed)
BP 105/82   Pulse 89   Temp 98.8 F (37.1 C) (Oral)   Wt 146 lb 14.4 oz (66.6 kg)   LMP 04/21/2014 (Approximate)   SpO2 97%   BMI 23.71 kg/m    Subjective:    Patient ID: Frances Davidson, female    DOB: 12-02-1960, 61 y.o.   MRN: [id number]  HPI: Frances Davidson is a 61 y.o. female  Chief Complaint  Patient presents with   Urticaria     All across body, c/o itchiness. Pt reports hives are in face, arms, torso, legs and ankles.    RASH Duration:   3 months   Location: generalized  Itching: yes Burning: no Redness: yes Oozing: no Scaling: no Blisters: no Painful: yes Fevers: no Change in detergents/soaps/personal care products: no Recent illness: no Recent travel:no History of same: yes Context: worse Alleviating factors: nothing Treatments attempted:nothing Shortness of breath: yes  Throat/tongue swelling: no Myalgias/arthralgias: yes  Relevant past medical, surgical, family and social history reviewed and updated as indicated. Interim medical history since our last visit reviewed. Allergies and medications reviewed and updated.  Review of Systems  Skin:  Positive for rash.    Per HPI unless specifically indicated above     Objective:    BP 105/82   Pulse 89   Temp 98.8 F (37.1 C) (Oral)   Wt 146 lb 14.4 oz (66.6 kg)   LMP 04/21/2014 (Approximate)   SpO2 97%   BMI 23.71 kg/m   Wt Readings from Last 3 Encounters:  06/01/22 146 lb 14.4 oz (66.6 kg)  04/24/22 147 lb 12.8 oz (67 kg)  04/23/22 148 lb 3.2 oz (67.2 kg)    Physical Exam Vitals and nursing note reviewed.  Constitutional:      General: She is not in acute distress.    Appearance: Normal appearance. She is normal weight. She is not ill-appearing, toxic-appearing or diaphoretic.  HENT:     Head: Normocephalic.     Right Ear: External ear normal.     Left Ear: External ear normal.     Nose: Nose normal.     Mouth/Throat:     Mouth: Mucous membranes are moist.     Pharynx:  Oropharynx is clear.  Eyes:     General:        Right eye: No discharge.        Left eye: No discharge.     Extraocular Movements: Extraocular movements intact.     Conjunctiva/sclera: Conjunctivae normal.     Pupils: Pupils are equal, round, and reactive to light.  Cardiovascular:     Rate and Rhythm: Normal rate and regular rhythm.     Heart sounds: No murmur heard. Pulmonary:     Effort: Pulmonary effort is normal. No respiratory distress.     Breath sounds: Normal breath sounds. No wheezing or rales.  Musculoskeletal:     Cervical back: Normal range of motion and neck supple.  Skin:    General: Skin is warm and dry.     Capillary Refill: Capillary refill takes less than 2 seconds.     Findings: Erythema and rash present. Rash is papular and purpuric.  Neurological:     General: No focal deficit present.     Mental Status: She is alert and oriented to person, place, and time. Mental status is at baseline.  Psychiatric:        Mood and Affect: Mood normal.        Behavior: Behavior normal.  Thought Content: Thought content normal.        Judgment: Judgment normal.     Results for orders placed or performed in visit on 04/07/22  Rapid Strep Screen ([facility] ONLY)   Specimen: Other   Other  Result Value Ref Range   Strep Gp A Ag, IA W/Reflex Negative Negative  Culture, Group A Strep   Other  Result Value Ref Range   Strep A Culture Negative   ANA 12 Plus Profile (RDL)  Result Value Ref Range   Anti-Nuclear Ab by IFA (RDL) Positive (A) Negative  CBC with Differential/Platelet  Result Value Ref Range   WBC 4.6 3.4 - 10.8 x10E3/uL   RBC 4.08 3.77 - 5.28 x10E6/uL   Hemoglobin 13.0 11.1 - 15.9 g/dL   Hematocrit 39.0 34.0 - 46.6 %   MCV 96 79 - 97 fL   MCH 31.9 26.6 - 33.0 pg   MCHC 33.3 31.5 - 35.7 g/dL   RDW 13.3 11.7 - 15.4 %   Platelets 192 150 - 450 x10E3/uL   Neutrophils 69 Not Estab. %   Lymphs 19 Not Estab. %   Monocytes 7 Not Estab. %   Eos 4  Not Estab. %   Basos 1 Not Estab. %   Neutrophils Absolute 3.2 1.4 - 7.0 x10E3/uL   Lymphocytes Absolute 0.9 0.7 - 3.1 x10E3/uL   Monocytes Absolute 0.3 0.1 - 0.9 x10E3/uL   EOS (ABSOLUTE) 0.2 0.0 - 0.4 x10E3/uL   Basophils Absolute 0.0 0.0 - 0.2 x10E3/uL   Immature Granulocytes 0 Not Estab. %   Immature Grans (Abs) 0.0 0.0 - 0.1 x10E3/uL  Comprehensive metabolic panel  Result Value Ref Range   Glucose 86 70 - 99 mg/dL   BUN 6 (L) 8 - 27 mg/dL   Creatinine, Ser 0.78 0.57 - 1.00 mg/dL   eGFR 85 >59 mL/min/1.73   BUN/Creatinine Ratio 8 (L) 12 - 28   Sodium 139 134 - 144 mmol/L   Potassium 3.7 3.5 - 5.2 mmol/L   Chloride 99 96 - 106 mmol/L   CO2 21 20 - 29 mmol/L   Calcium 9.6 8.7 - 10.3 mg/dL   Total Protein 7.3 6.0 - 8.5 g/dL   Albumin 4.5 3.9 - 4.9 g/dL   Globulin, Total 2.8 1.5 - 4.5 g/dL   Albumin/Globulin Ratio 1.6 1.2 - 2.2   Bilirubin Total 0.5 0.0 - 1.2 mg/dL   Alkaline Phosphatase 118 44 - 121 IU/L   AST 101 (H) 0 - 40 IU/L   ALT 60 (H) 0 - 32 IU/L  ANA 12 Plus Profile, Positive  Result Value Ref Range   Speckled Pattern 1:640 (H) <1:40   Midbody Pattern 1:640 (H) <1:40   Nuclear Dot Pattern >1:1280 (A) <1:40   Note: Comment    Anti-Centromere Ab (RDL) <1:40 <1:40   Anti-dsDNA Ab by Farr(RDL) <8.0 <8.0 IU/mL   Anti-Sm Ab (RDL) <20 <20 Units   Anti-U1 RNP Ab (RDL) <20 <20 Units   Anti-Ro (SS-A) Ab (RDL) 108 (H) <20 Units   Anti-La (SS-B) Ab (RDL) 25 (H) <20 Units   Anti-Scl-70 Ab (RDL) <20 <20 Units   Anti-Cardiolipin Ab, IgG (RDL) <15 <15 GPL U/mL   Anti-Cardiolipin Ab, IgA (RDL) <12 <12 APL U/mL   Anti-Cardiolipin Ab, IgM (RDL) <13 <13 MPL U/mL   C3 Complement (RDL) 203 (H) 82 - 167 mg/dL   C4 Complement (RDL) 39 14 - 44 mg/dL   Anti-TPO Ab (RDL) <9.0 <9.0 IU/mL     Anti-Chromatin Ab, IgG (RDL) <20 <20 Units   Anti-CCP Ab, IgG & IgA (RDL) <20 <20 Units   Rheumatoid Factor by Turb RDL <14 <14 IU/mL   ANA Plus 12 Interpretation Comment       Assessment &  Plan:   Problem List Items Addressed This Visit       Musculoskeletal and Integument   Rash - Primary    Chronic. Ongoing x 3 months. Will give triamcinolone in office today.  Will also give prednisone to start in two days.  Will try to get patient into Rheumatology sooner than two months.        Relevant Medications   triamcinolone acetonide (KENALOG-40) injection 40 mg     Follow up plan: No follow-ups on file.      

## 2022-06-04 ENCOUNTER — Ambulatory Visit: Payer: Medicaid Other

## 2022-06-11 NOTE — Telephone Encounter (Signed)
Called patient to inform she is scheduled 06/16/22 at 8 AM with [facility].

## 2022-06-24 ENCOUNTER — Ambulatory Visit: Payer: Medicaid Other | Admitting: Nurse Practitioner

## 2022-06-25 ENCOUNTER — Encounter: Payer: Self-pay | Admitting: Nurse Practitioner

## 2022-07-21 DIAGNOSIS — M359 Systemic involvement of connective tissue, unspecified: Secondary | ICD-10-CM | POA: Insufficient documentation

## 2022-07-22 NOTE — Progress Notes (Unsigned)
LMP 04/21/2014 (Approximate)    Subjective:    Patient ID: Frances Davidson, female    DOB: 12-Jan-1961, 61 y.o.   MRN: [id number]  HPI: Frances Davidson is a 61 y.o. female  No chief complaint on file.   RASH Patient states the rash has not improved.  Still ongoing after two round of prednisone. The triamcinalone dose help.  Plans to make an appt with Rheumatology.  Will see Dermatology at the end of the month.  Duration:  weeks  Location: trunk, arms, and legs  Itching: yes Burning: no Redness: yes Oozing: no Scaling: no Blisters: no Painful: yes Fevers: no Change in detergents/soaps/personal care products: no Recent illness: no Recent travel:no History of same: yes Context: worse Alleviating factors: hydrocortisone cream, benadryl, and lotion/moisturizer Treatments attempted: Calamine lotion  Shortness of breath: no  Throat/tongue swelling: no Myalgias/arthralgias: no   Relevant past medical, surgical, family and social history reviewed and updated as indicated. Interim medical history since our last visit reviewed. Allergies and medications reviewed and updated.  Review of Systems  Constitutional:  Negative for chills, fatigue and fever.  HENT:  Positive for sore throat. Negative for congestion, ear pain, sinus pressure, sinus pain and sneezing.   Eyes:  Negative for pain, discharge and redness.  Respiratory:  Negative for cough, chest tightness and shortness of breath.   Cardiovascular:  Negative for chest pain and palpitations.  Gastrointestinal:  Negative for diarrhea, nausea and vomiting.  Allergic/Immunologic: Negative for environmental allergies and food allergies.  Neurological: Negative.   Psychiatric/Behavioral: Negative.      Per HPI unless specifically indicated above     Objective:    LMP 04/21/2014 (Approximate)   Wt Readings from Last 3 Encounters:  06/01/22 146 lb 14.4 oz (66.6 kg)  04/24/22 147 lb 12.8 oz (67 kg)  04/23/22 148 lb 3.2 oz  (67.2 kg)    Physical Exam Vitals reviewed.  Constitutional:      General: She is not in acute distress.    Appearance: She is not toxic-appearing.  HENT:     Head: Normocephalic.     Right Ear: Hearing, tympanic membrane, ear canal and external ear normal.     Left Ear: Hearing, tympanic membrane, ear canal and external ear normal.     Nose: Nose normal. No congestion or rhinorrhea.     Mouth/Throat:     Mouth: Mucous membranes are moist.     Pharynx: Uvula midline. Oropharyngeal exudate and posterior oropharyngeal erythema present.     Tonsils: Tonsillar exudate present.  Eyes:     General: Lids are normal.     Extraocular Movements: Extraocular movements intact.     Conjunctiva/sclera: Conjunctivae normal.  Neck:     Thyroid: No thyromegaly or thyroid tenderness.  Cardiovascular:     Rate and Rhythm: Normal rate and regular rhythm.     Pulses: Normal pulses.     Heart sounds: Normal heart sounds. No murmur heard. Pulmonary:     Effort: Pulmonary effort is normal. No accessory muscle usage.     Breath sounds: Normal breath sounds. No stridor.  Lymphadenopathy:     Head:     Right side of head: No submental, submandibular, tonsillar, preauricular or posterior auricular adenopathy.     Left side of head: No submental, submandibular, tonsillar, preauricular or posterior auricular adenopathy.  Skin:    Findings: Rash present. Rash is urticarial.     Comments: Small, scattered lesions to arms, legs and trunk. She has some old scars  and some raised, red lesions. Also has erythema under both breasts that has a yeast-like appearance.  Neurological:     Mental Status: She is alert.  Psychiatric:        Attention and Perception: Attention normal.        Mood and Affect: Mood normal.        Speech: Speech normal.        Behavior: Behavior normal.    Results for orders placed or performed in visit on 04/07/22  Rapid Strep Screen ([facility] ONLY)   Specimen: Other   Other   Result Value Ref Range   Strep Gp A Ag, IA W/Reflex Negative Negative  Culture, Group A Strep   Other  Result Value Ref Range   Strep A Culture Negative   ANA 12 Plus Profile (RDL)  Result Value Ref Range   Anti-Nuclear Ab by IFA (RDL) Positive (A) Negative  CBC with Differential/Platelet  Result Value Ref Range   WBC 4.6 3.4 - 10.8 x10E3/uL   RBC 4.08 3.77 - 5.28 x10E6/uL   Hemoglobin 13.0 11.1 - 15.9 g/dL   Hematocrit 39.0 34.0 - 46.6 %   MCV 96 79 - 97 fL   MCH 31.9 26.6 - 33.0 pg   MCHC 33.3 31.5 - 35.7 g/dL   RDW 13.3 11.7 - 15.4 %   Platelets 192 150 - 450 x10E3/uL   Neutrophils 69 Not Estab. %   Lymphs 19 Not Estab. %   Monocytes 7 Not Estab. %   Eos 4 Not Estab. %   Basos 1 Not Estab. %   Neutrophils Absolute 3.2 1.4 - 7.0 x10E3/uL   Lymphocytes Absolute 0.9 0.7 - 3.1 x10E3/uL   Monocytes Absolute 0.3 0.1 - 0.9 x10E3/uL   EOS (ABSOLUTE) 0.2 0.0 - 0.4 x10E3/uL   Basophils Absolute 0.0 0.0 - 0.2 x10E3/uL   Immature Granulocytes 0 Not Estab. %   Immature Grans (Abs) 0.0 0.0 - 0.1 x10E3/uL  Comprehensive metabolic panel  Result Value Ref Range   Glucose 86 70 - 99 mg/dL   BUN 6 (L) 8 - 27 mg/dL   Creatinine, Ser 0.78 0.57 - 1.00 mg/dL   eGFR 85 >59 mL/min/1.73   BUN/Creatinine Ratio 8 (L) 12 - 28   Sodium 139 134 - 144 mmol/L   Potassium 3.7 3.5 - 5.2 mmol/L   Chloride 99 96 - 106 mmol/L   CO2 21 20 - 29 mmol/L   Calcium 9.6 8.7 - 10.3 mg/dL   Total Protein 7.3 6.0 - 8.5 g/dL   Albumin 4.5 3.9 - 4.9 g/dL   Globulin, Total 2.8 1.5 - 4.5 g/dL   Albumin/Globulin Ratio 1.6 1.2 - 2.2   Bilirubin Total 0.5 0.0 - 1.2 mg/dL   Alkaline Phosphatase 118 44 - 121 IU/L   AST 101 (H) 0 - 40 IU/L   ALT 60 (H) 0 - 32 IU/L  ANA 12 Plus Profile, Positive  Result Value Ref Range   Speckled Pattern 1:640 (H) <1:40   Midbody Pattern 1:640 (H) <1:40   Nuclear Dot Pattern >1:1280 (A) <1:40   Note: Comment    Anti-Centromere Ab (RDL) <1:40 <1:40   Anti-dsDNA Ab by Farr(RDL)  <8.0 <8.0 IU/mL   Anti-Sm Ab (RDL) <20 <20 Units   Anti-U1 RNP Ab (RDL) <20 <20 Units   Anti-Ro (SS-A) Ab (RDL) 108 (H) <20 Units   Anti-La (SS-B) Ab (RDL) 25 (H) <20 Units   Anti-Scl-70 Ab (RDL) <20 <20 Units   Anti-Cardiolipin  Ab, IgG (RDL) <15 <15 GPL U/mL   Anti-Cardiolipin Ab, IgA (RDL) <12 <12 APL U/mL   Anti-Cardiolipin Ab, IgM (RDL) <13 <13 MPL U/mL   C3 Complement (RDL) 203 (H) 82 - 167 mg/dL   C4 Complement (RDL) 39 14 - 44 mg/dL   Anti-TPO Ab (RDL) <9.0 <9.0 IU/mL   Anti-Chromatin Ab, IgG (RDL) <20 <20 Units   Anti-CCP Ab, IgG & IgA (RDL) <20 <20 Units   Rheumatoid Factor by Turb RDL <14 <14 IU/mL   ANA Plus 12 Interpretation Comment       Assessment & Plan:   Problem List Items Addressed This Visit   None    Follow up plan: No follow-ups on file.

## 2022-07-23 ENCOUNTER — Ambulatory Visit (INDEPENDENT_AMBULATORY_CARE_PROVIDER_SITE_OTHER): Payer: Self-pay | Admitting: Nurse Practitioner

## 2022-07-23 ENCOUNTER — Encounter: Payer: Self-pay | Admitting: Nurse Practitioner

## 2022-07-23 VITALS — BP 90/70 | HR 86 | Temp 98.0°F | Wt 146.9 lb

## 2022-07-23 DIAGNOSIS — M359 Systemic involvement of connective tissue, unspecified: Secondary | ICD-10-CM | POA: Insufficient documentation

## 2022-07-23 NOTE — Assessment & Plan Note (Signed)
Newly diagnosed with Rheumatology. On Prednisone '10mg'$  daily until Plaquenil becomes effective.  Discussed getting eye exams regularly with patient.  Will check CMP and CBC at visit today.  Follow up in 3 months.  Call sooner if concerns arise.

## 2022-07-24 LAB — CBC WITH DIFFERENTIAL/PLATELET
Basophils Absolute: 0.1 10*3/uL (ref 0.0–0.2)
Basos: 1 %
EOS (ABSOLUTE): 0.1 10*3/uL (ref 0.0–0.4)
Eos: 1 %
Hematocrit: 37 % (ref 34.0–46.6)
Hemoglobin: 11.9 g/dL (ref 11.1–15.9)
Immature Grans (Abs): 0 10*3/uL (ref 0.0–0.1)
Immature Granulocytes: 0 %
Lymphocytes Absolute: 1.6 10*3/uL (ref 0.7–3.1)
Lymphs: 24 %
MCH: 31.2 pg (ref 26.6–33.0)
MCHC: 32.2 g/dL (ref 31.5–35.7)
MCV: 97 fL (ref 79–97)
Monocytes Absolute: 0.6 10*3/uL (ref 0.1–0.9)
Monocytes: 9 %
Neutrophils Absolute: 4.5 10*3/uL (ref 1.4–7.0)
Neutrophils: 65 %
Platelets: 218 10*3/uL (ref 150–450)
RBC: 3.82 x10E6/uL (ref 3.77–5.28)
RDW: 14 % (ref 11.7–15.4)
WBC: 7 10*3/uL (ref 3.4–10.8)

## 2022-07-24 LAB — COMPREHENSIVE METABOLIC PANEL
ALT: 36 IU/L — ABNORMAL HIGH (ref 0–32)
AST: 60 IU/L — ABNORMAL HIGH (ref 0–40)
Albumin/Globulin Ratio: 1.6 (ref 1.2–2.2)
Albumin: 4.1 g/dL (ref 3.9–4.9)
Alkaline Phosphatase: 81 IU/L (ref 44–121)
BUN/Creatinine Ratio: 11 — ABNORMAL LOW (ref 12–28)
BUN: 10 mg/dL (ref 8–27)
Bilirubin Total: 0.7 mg/dL (ref 0.0–1.2)
CO2: 24 mmol/L (ref 20–29)
Calcium: 8.8 mg/dL (ref 8.7–10.3)
Chloride: 100 mmol/L (ref 96–106)
Creatinine, Ser: 0.87 mg/dL (ref 0.57–1.00)
Globulin, Total: 2.5 g/dL (ref 1.5–4.5)
Glucose: 79 mg/dL (ref 70–99)
Potassium: 3.9 mmol/L (ref 3.5–5.2)
Sodium: 139 mmol/L (ref 134–144)
Total Protein: 6.6 g/dL (ref 6.0–8.5)
eGFR: 75 mL/min/{1.73_m2} (ref >59)

## 2022-07-24 NOTE — Progress Notes (Signed)
Hi Frances Davidson. It was nice to see you yesterday.  Your lab work looks good.  No concerns at this time. Continue with your current medication regimen.  Follow up as discussed.  Please let me know if you have any questions.

## 2022-08-05 ENCOUNTER — Other Ambulatory Visit: Payer: Self-pay

## 2022-08-05 DIAGNOSIS — Z1231 Encounter for screening mammogram for malignant neoplasm of breast: Secondary | ICD-10-CM

## 2022-08-10 ENCOUNTER — Ambulatory Visit: Payer: Self-pay | Attending: Hematology and Oncology | Admitting: Hematology and Oncology

## 2022-08-10 ENCOUNTER — Ambulatory Visit
Admission: RE | Admit: 2022-08-10 | Discharge: 2022-08-10 | Disposition: A | Discharge status: Home / Self Care | Payer: Medicaid Other | Source: Ambulatory Visit | Attending: Obstetrics and Gynecology | Admitting: Obstetrics and Gynecology

## 2022-08-10 VITALS — BP 101/79 | Wt 146.7 lb

## 2022-08-10 DIAGNOSIS — Z1231 Encounter for screening mammogram for malignant neoplasm of breast: Secondary | ICD-10-CM | POA: Insufficient documentation

## 2022-08-10 NOTE — Patient Instructions (Signed)
Taught Frances Davidson about self breast awareness. Patient did not need a Pap smear today due to last Pap smear was in 2022 per patient. Let her know [facility] will cover Pap smears every 5 years unless has a history of abnormal Pap smears. [facility]. Appointment scheduled for 08/10/22. Patient aware of appointment and will be there. Let patient know will follow up with her within the next couple weeks with results. Frances Davidson verbalized understanding.  Melodye Ped, NP 10:52 AM

## 2022-08-10 NOTE — Progress Notes (Signed)
Ms. Frances Davidson is a 61 y.o. female who presents to [facility] clinic today with no complaints.    Pap Smear: Pap not smear completed today. Last Pap smear was 2022 at [facility] and was normal. Per patient has no history of an abnormal Pap smear. Last Pap smear result is not available in Epic.   Physical exam: Breasts Breasts symmetrical. No skin abnormalities bilateral breasts. No nipple retraction bilateral breasts. No nipple discharge bilateral breasts. No lymphadenopathy. No lumps palpated bilateral breasts.       Pelvic/Bimanual Pap is not indicated today    Smoking History: Patient has never smoked and was not referred to quit line.    Patient Navigation: Patient education provided. Access to services provided for patient through [facility] program. No interpreter provided. No transportation provided   Colorectal Cancer Screening: Per patient has had colonoscopy completed on 2022 and was benign.  No complaints today.    Breast and Cervical Cancer Risk Assessment: Patient does not have family history of breast cancer, known genetic mutations, or radiation treatment to the chest before age 90. Patient does not have history of cervical dysplasia, immunocompromised, or DES exposure in-utero.  Risk Assessment   No risk assessment data for the current encounter  Risk Scores       06/18/2021   Last edited by: Velna Hatchet, CMA   5-year risk: 1.5 %   Lifetime risk: 6.5 %            A: [facility] exam without pap smear No complaints with benign exam.   P: [facility]. Appointment scheduled 08/10/22.  Melodye Ped, NP 08/10/2022 10:31 AM

## 2022-09-22 DIAGNOSIS — Z79899 Other long term (current) drug therapy: Secondary | ICD-10-CM | POA: Diagnosis not present

## 2022-09-22 DIAGNOSIS — F10982 Alcohol use, unspecified with alcohol-induced sleep disorder: Secondary | ICD-10-CM | POA: Diagnosis not present

## 2022-09-22 DIAGNOSIS — M359 Systemic involvement of connective tissue, unspecified: Secondary | ICD-10-CM | POA: Diagnosis not present

## 2022-09-22 DIAGNOSIS — R21 Rash and other nonspecific skin eruption: Secondary | ICD-10-CM | POA: Diagnosis not present

## 2022-09-22 DIAGNOSIS — R7989 Other specified abnormal findings of blood chemistry: Secondary | ICD-10-CM | POA: Diagnosis not present

## 2022-10-21 ENCOUNTER — Other Ambulatory Visit: Payer: Self-pay | Admitting: Nurse Practitioner

## 2022-10-21 NOTE — Telephone Encounter (Signed)
Requested Prescriptions  Pending Prescriptions Disp Refills   atorvastatin (LIPITOR) 20 MG tablet [Pharmacy Med Name: ATORVASTATIN 20 MG TABLET] 90 tablet 0    Sig: TAKE ONE TABLET BY MOUTH DAILY     Cardiovascular:  Antilipid - Statins Failed - 10/21/2022 10:08 AM      Failed - Lipid Panel in normal range within the last 12 months    Cholesterol, Total  Date Value Ref Range Status  03/10/2021 197 100 - 199 mg/dL Final   LDL Chol Calc (NIH)  Date Value Ref Range Status  03/10/2021 58 0 - 99 mg/dL Final   HDL  Date Value Ref Range Status  03/10/2021 112 >39 mg/dL Final   Triglycerides  Date Value Ref Range Status  03/10/2021 169 (H) 0 - 149 mg/dL Final         Passed - Patient is not pregnant      Passed - Valid encounter within last 12 months    Recent Outpatient Visits           3 months ago Undifferentiated connective tissue disease ([place])   [place] Jon Billings, NP   4 months ago [place], Karen, NP   6 months ago Positive ANA (antinuclear antibody)   [place], Karen, NP   6 months ago Sore throat   [place], [place] T, NP   10 months ago Diarrhea, unspecified type   [place] Jon Billings, NP       Future Appointments             In 2 days Jon Billings, NP [place]

## 2022-10-23 ENCOUNTER — Ambulatory Visit: Payer: Medicaid Other | Admitting: Nurse Practitioner

## 2022-10-28 ENCOUNTER — Ambulatory Visit (INDEPENDENT_AMBULATORY_CARE_PROVIDER_SITE_OTHER): Payer: Medicaid Other | Admitting: Nurse Practitioner

## 2022-10-28 ENCOUNTER — Encounter: Payer: Self-pay | Admitting: Nurse Practitioner

## 2022-10-28 VITALS — BP 104/70 | HR 99 | Temp 98.4°F | Wt 148.5 lb

## 2022-10-28 DIAGNOSIS — R7989 Other specified abnormal findings of blood chemistry: Secondary | ICD-10-CM

## 2022-10-28 DIAGNOSIS — M359 Systemic involvement of connective tissue, unspecified: Secondary | ICD-10-CM

## 2022-10-28 NOTE — Assessment & Plan Note (Signed)
Chronic. Followed by Rheumatology.  On Hydroxychloroquine.  Continue to follow up with Rheumatology.

## 2022-10-28 NOTE — Progress Notes (Signed)
BP 104/70   Pulse 99   Temp 98.4 F (36.9 C) (Oral)   Wt 148 lb 8 oz (67.4 kg)   LMP 04/21/2014 (Approximate)   SpO2 98%   BMI 23.97 kg/m    Subjective:    Patient ID: Frances Davidson, female    DOB: [birth year], 62 y.o.   MRN: [id number]  HPI: Frances Davidson is a 62 y.o. female  Chief Complaint  Patient presents with   Connective Tissue Disorder   Patient presents to clinic to discuss her connective tissue disorder.   Patient states she is still taking the hydroxychloroquine.  She has not seen a difference in her symptoms.  The symptoms come and go.  She feels like her vision is declining with being on the medication.    Relevant past medical, surgical, family and social history reviewed and updated as indicated. Interim medical history since our last visit reviewed. Allergies and medications reviewed and updated.  Review of Systems  Skin:        Itching and hives    Per HPI unless specifically indicated above     Objective:    BP 104/70   Pulse 99   Temp 98.4 F (36.9 C) (Oral)   Wt 148 lb 8 oz (67.4 kg)   LMP 04/21/2014 (Approximate)   SpO2 98%   BMI 23.97 kg/m   Wt Readings from Last 3 Encounters:  10/28/22 148 lb 8 oz (67.4 kg)  08/10/22 146 lb 11.2 oz (66.5 kg)  07/23/22 146 lb 14.4 oz (66.6 kg)    Physical Exam Vitals and nursing note reviewed.  Constitutional:      General: She is not in acute distress.    Appearance: Normal appearance. She is normal weight. She is not ill-appearing, toxic-appearing or diaphoretic.  HENT:     Head: Normocephalic.     Right Ear: External ear normal.     Left Ear: External ear normal.     Nose: Nose normal.     Mouth/Throat:     Mouth: Mucous membranes are moist.     Pharynx: Oropharynx is clear.  Eyes:     General:        Right eye: No discharge.        Left eye: No discharge.     Extraocular Movements: Extraocular movements intact.     Conjunctiva/sclera: Conjunctivae normal.     Pupils: Pupils are  equal, round, and reactive to light.  Cardiovascular:     Rate and Rhythm: Normal rate and regular rhythm.     Heart sounds: No murmur heard. Pulmonary:     Effort: Pulmonary effort is normal. No respiratory distress.     Breath sounds: Normal breath sounds. No wheezing or rales.  Musculoskeletal:     Cervical back: Normal range of motion and neck supple.  Skin:    General: Skin is warm and dry.     Capillary Refill: Capillary refill takes less than 2 seconds.  Neurological:     General: No focal deficit present.     Mental Status: She is alert and oriented to person, place, and time. Mental status is at baseline.  Psychiatric:        Mood and Affect: Mood normal.        Behavior: Behavior normal.        Thought Content: Thought content normal.        Judgment: Judgment normal.     Results for orders placed or performed in visit on 07/23/22  Comp Met (CMET)  Result Value Ref Range   Glucose 79 70 - 99 mg/dL   BUN 10 8 - 27 mg/dL   Creatinine, Ser 0.87 0.57 - 1.00 mg/dL   eGFR 75 >59 mL/min/1.73   BUN/Creatinine Ratio 11 (L) 12 - 28   Sodium 139 134 - 144 mmol/L   Potassium 3.9 3.5 - 5.2 mmol/L   Chloride 100 96 - 106 mmol/L   CO2 24 20 - 29 mmol/L   Calcium 8.8 8.7 - 10.3 mg/dL   Total Protein 6.6 6.0 - 8.5 g/dL   Albumin 4.1 3.9 - 4.9 g/dL   Globulin, Total 2.5 1.5 - 4.5 g/dL   Albumin/Globulin Ratio 1.6 1.2 - 2.2   Bilirubin Total 0.7 0.0 - 1.2 mg/dL   Alkaline Phosphatase 81 44 - 121 IU/L   AST 60 (H) 0 - 40 IU/L   ALT 36 (H) 0 - 32 IU/L  CBC w/Diff  Result Value Ref Range   WBC 7.0 3.4 - 10.8 x10E3/uL   RBC 3.82 3.77 - 5.28 x10E6/uL   Hemoglobin 11.9 11.1 - 15.9 g/dL   Hematocrit 37.0 34.0 - 46.6 %   MCV 97 79 - 97 fL   MCH 31.2 26.6 - 33.0 pg   MCHC 32.2 31.5 - 35.7 g/dL   RDW 14.0 11.7 - 15.4 %   Platelets 218 150 - 450 x10E3/uL   Neutrophils 65 Not Estab. %   Lymphs 24 Not Estab. %   Monocytes 9 Not Estab. %   Eos 1 Not Estab. %   Basos 1 Not Estab. %    Neutrophils Absolute 4.5 1.4 - 7.0 x10E3/uL   Lymphocytes Absolute 1.6 0.7 - 3.1 x10E3/uL   Monocytes Absolute 0.6 0.1 - 0.9 x10E3/uL   EOS (ABSOLUTE) 0.1 0.0 - 0.4 x10E3/uL   Basophils Absolute 0.1 0.0 - 0.2 x10E3/uL   Immature Granulocytes 0 Not Estab. %   Immature Grans (Abs) 0.0 0.0 - 0.1 x10E3/uL      Assessment & Plan:   Problem List Items Addressed This Visit       Other   Undifferentiated connective tissue disease (HCC)    Chronic. Followed by Rheumatology.  On Hydroxychloroquine.  Continue to follow up with Rheumatology.      Other Visit Diagnoses     Elevated LFTs    -  Primary   Will check hepatitis panel. Discussed decreasing alcohol use.   Relevant Orders   Acute Viral Hepatitis (HAV, HBV, HCV)   Comp Met (CMET)        Follow up plan: Return in about 3 months (around 01/26/2023) for Medication Management.

## 2022-10-29 LAB — COMPREHENSIVE METABOLIC PANEL
ALT: 35 IU/L — ABNORMAL HIGH (ref 0–32)
AST: 76 IU/L — ABNORMAL HIGH (ref 0–40)
Albumin/Globulin Ratio: 1.8 (ref 1.2–2.2)
Albumin: 4.2 g/dL (ref 3.9–4.9)
Alkaline Phosphatase: 107 IU/L (ref 44–121)
BUN/Creatinine Ratio: 10 — ABNORMAL LOW (ref 12–28)
BUN: 8 mg/dL (ref 8–27)
Bilirubin Total: 0.5 mg/dL (ref 0.0–1.2)
CO2: 20 mmol/L (ref 20–29)
Calcium: 9 mg/dL (ref 8.7–10.3)
Chloride: 103 mmol/L (ref 96–106)
Creatinine, Ser: 0.81 mg/dL (ref 0.57–1.00)
Globulin, Total: 2.3 g/dL (ref 1.5–4.5)
Glucose: 73 mg/dL (ref 70–99)
Potassium: 4.2 mmol/L (ref 3.5–5.2)
Sodium: 141 mmol/L (ref 134–144)
Total Protein: 6.5 g/dL (ref 6.0–8.5)
eGFR: 82 mL/min/{1.73_m2} (ref >59)

## 2022-10-29 LAB — ACUTE VIRAL HEPATITIS (HAV, HBV, HCV)
HCV Ab: NONREACTIVE
Hep A IgM: NEGATIVE
Hep B C IgM: NEGATIVE
Hepatitis B Surface Ag: NEGATIVE

## 2022-10-29 LAB — HCV INTERPRETATION

## 2022-10-29 NOTE — Progress Notes (Signed)
Please let patient know that her liver enzymes are relatively the same as prior.  Her hepatitis panel is negative.  I do recommend she decrease her alcohol intake and follow up with Rheumatology.

## 2022-12-11 ENCOUNTER — Other Ambulatory Visit: Payer: Self-pay | Admitting: Nurse Practitioner

## 2022-12-14 NOTE — Telephone Encounter (Signed)
Requested Prescriptions  Refused Prescriptions Disp Refills   lidocaine (XYLOCAINE) 2 % solution [Pharmacy Med Name: LIDOCAINE 2% VISCOUS SOLN] 100 mL 0    Sig: USE 15ML IN THE MOUTH OR THROAT AS NEEDED FOR MOUTH PAIN     Off-Protocol Failed - 12/11/2022  4:05 PM      Failed - Medication not assigned to a protocol, review manually.      Passed - Valid encounter within last 12 months    Recent Outpatient Visits           1 month ago Elevated LFTs   [place], Karen, NP   4 months ago Undifferentiated connective tissue disease [facility])   [place] Jon Billings, NP   6 months ago [place], NP   7 months ago Positive ANA (antinuclear antibody)   [place], NP   8 months ago Sore throat   [place], Jolene T, NP       Future Appointments             In 1 month Jon Billings, NP [place], [place]

## 2022-12-28 ENCOUNTER — Other Ambulatory Visit: Payer: Self-pay

## 2022-12-28 ENCOUNTER — Emergency Department
Admission: EM | Admit: 2022-12-28 | Discharge: 2022-12-28 | Disposition: A | Discharge status: Home / Self Care | Payer: Medicaid Other | Attending: Emergency Medicine | Admitting: Emergency Medicine

## 2022-12-28 ENCOUNTER — Emergency Department: Payer: Medicaid Other

## 2022-12-28 DIAGNOSIS — K760 Fatty (change of) liver, not elsewhere classified: Secondary | ICD-10-CM | POA: Diagnosis not present

## 2022-12-28 DIAGNOSIS — R1030 Lower abdominal pain, unspecified: Secondary | ICD-10-CM | POA: Diagnosis present

## 2022-12-28 DIAGNOSIS — K529 Noninfective gastroenteritis and colitis, unspecified: Secondary | ICD-10-CM | POA: Insufficient documentation

## 2022-12-28 DIAGNOSIS — R1032 Left lower quadrant pain: Secondary | ICD-10-CM | POA: Diagnosis not present

## 2022-12-28 LAB — COMPREHENSIVE METABOLIC PANEL
ALT: 28 U/L (ref 0–44)
AST: 52 U/L — ABNORMAL HIGH (ref 15–41)
Albumin: 4.1 g/dL (ref 3.5–5.0)
Alkaline Phosphatase: 81 U/L (ref 38–126)
Anion gap: 11 (ref 5–15)
BUN: 9 mg/dL (ref 8–23)
CO2: 24 mmol/L (ref 22–32)
Calcium: 8.8 mg/dL — ABNORMAL LOW (ref 8.9–10.3)
Chloride: 102 mmol/L (ref 98–111)
Creatinine, Ser: 0.72 mg/dL (ref 0.44–1.00)
GFR, Estimated: >60 mL/min (ref >60)
Glucose, Bld: 80 mg/dL (ref 70–99)
Potassium: 3.9 mmol/L (ref 3.5–5.1)
Sodium: 137 mmol/L (ref 135–145)
Total Bilirubin: 0.7 mg/dL (ref 0.3–1.2)
Total Protein: 7 g/dL (ref 6.5–8.1)

## 2022-12-28 LAB — CBC
HCT: 37.6 % (ref 36.0–46.0)
Hemoglobin: 12.2 g/dL (ref 12.0–15.0)
MCH: 31 pg (ref 26.0–34.0)
MCHC: 32.4 g/dL (ref 30.0–36.0)
MCV: 95.7 fL (ref 80.0–100.0)
Platelets: 209 10*3/uL (ref 150–400)
RBC: 3.93 MIL/uL (ref 3.87–5.11)
RDW: 13.2 % (ref 11.5–15.5)
WBC: 7.5 10*3/uL (ref 4.0–10.5)
nRBC: 0 % (ref 0.0–0.2)

## 2022-12-28 LAB — URINALYSIS, ROUTINE W REFLEX MICROSCOPIC
Bacteria, UA: NONE SEEN
Bilirubin Urine: NEGATIVE
Glucose, UA: NEGATIVE mg/dL
Hgb urine dipstick: NEGATIVE
Ketones, ur: NEGATIVE mg/dL
Leukocytes,Ua: NEGATIVE
Nitrite: NEGATIVE
Protein, ur: NEGATIVE mg/dL
Specific Gravity, Urine: 1.014 (ref 1.005–1.030)
pH: 5 (ref 5.0–8.0)

## 2022-12-28 LAB — LIPASE, BLOOD: Lipase: 24 U/L (ref 11–51)

## 2022-12-28 MED ORDER — DICYCLOMINE HCL 10 MG PO CAPS: 10.0000 mg | ORAL_CAPSULE | Freq: Three times a day (TID) | ORAL | 0 refills | Status: DC

## 2022-12-28 MED ORDER — ONDANSETRON HCL 4 MG/2ML IJ SOLN
4.0000 mg | Freq: Once | INTRAMUSCULAR | Status: AC
  Administered 2022-12-28: 4 mg via INTRAVENOUS
  Filled 2022-12-28: qty 2

## 2022-12-28 MED ORDER — IOHEXOL 300 MG/ML  SOLN
100.0000 mL | Freq: Once | INTRAMUSCULAR | Status: AC | PRN
  Administered 2022-12-28: 100 mL via INTRAVENOUS

## 2022-12-28 MED ORDER — PREDNISONE 50 MG PO TABS: 50.0000 mg | ORAL_TABLET | Freq: Every day | ORAL | 0 refills | Status: DC

## 2022-12-28 MED ORDER — MORPHINE SULFATE (PF) 4 MG/ML IV SOLN
4.0000 mg | Freq: Once | INTRAVENOUS | Status: AC
  Administered 2022-12-28: 4 mg via INTRAVENOUS
  Filled 2022-12-28: qty 1

## 2022-12-28 MED ORDER — HYDROXYZINE HCL 25 MG PO TABS: 25.0000 mg | ORAL_TABLET | Freq: Three times a day (TID) | ORAL | 1 refills | Status: DC | PRN

## 2022-12-28 MED ORDER — AMOXICILLIN-POT CLAVULANATE 875-125 MG PO TABS: 1.0000 | ORAL_TABLET | Freq: Two times a day (BID) | ORAL | 0 refills | Status: AC

## 2022-12-28 MED ORDER — SODIUM CHLORIDE 0.9 % IV BOLUS
500.0000 mL | Freq: Once | INTRAVENOUS | Status: AC
  Administered 2022-12-28: 500 mL via INTRAVENOUS

## 2022-12-28 NOTE — ED Triage Notes (Signed)
Pt to ED via POV from home. Pt reports bilateral lower abdominal pain that started 1hr PTA. Pt alos reports diarrhea. Pt reports has been an intermittent issue but the pain is worse. Pt has had appendix removed.

## 2022-12-28 NOTE — ED Provider Notes (Signed)
[facility] Provider Note    Event Date/Time   First MD Initiated Contact with Patient 12/28/22 [phone]     (approximate)   History   Abdominal Pain   HPI  Frances Davidson is a 62 y.o. female with a history of a undifferentiated connective tissue disease who presents with complaints of lower abdominal pain.  Patient reports she has lower abdominal pain every couple of days, typically resolves with multiple bowel movements.  Today her symptoms of not improved.  She complains of moderate to severe pain in her lower abdomen bilaterally.  No nausea or vomiting.  No dysuria reported.  History of appendectomy     Physical Exam   Triage Vital Signs: ED Triage Vitals  Enc Vitals Group     BP 12/28/22 0927 117/78     Pulse Rate 12/28/22 0927 96     Resp 12/28/22 0927 18     Temp 12/28/22 0927 98.6 F (37 C)     Temp src --      SpO2 12/28/22 0927 100 %     Weight --      Height --      Head Circumference --      Peak Flow --      Pain Score 12/28/22 0924 5     Pain Loc --      Pain Edu? --      Excl. in GC? --     Most recent vital signs: Vitals:   12/28/22 0927 12/28/22 1206  BP: 117/78 109/77  Pulse: 96 90  Resp: 18 20  Temp: 98.6 F (37 C)   SpO2: 100% 100%     General: Awake, no distress.  CV:  Good peripheral perfusion.  Resp:  Normal effort.  Abd:  No distention.  Mild tenderness in the lower abdomen, left greater than right Other:     ED Results / Procedures / Treatments   Labs (all labs ordered are listed, but only abnormal results are displayed) Labs Reviewed  COMPREHENSIVE METABOLIC PANEL - Abnormal; Notable for the following components:      Result Value   Calcium 8.8 (*)    AST 52 (*)    All other components within normal limits  URINALYSIS, ROUTINE W REFLEX MICROSCOPIC - Abnormal; Notable for the following components:   Color, Urine YELLOW (*)    APPearance CLEAR (*)    All other components within normal limits  CBC   LIPASE, BLOOD     EKG     RADIOLOGY CT scan viewed interpret by me, no SBO noted, pending radiology review    PROCEDURES:  Critical Care performed:   Procedures   MEDICATIONS ORDERED IN ED: Medications  morphine (PF) 4 MG/ML injection 4 mg (4 mg Intravenous Given 12/28/22 0953)  ondansetron (ZOFRAN) injection 4 mg (4 mg Intravenous Given 12/28/22 0953)  sodium chloride 0.9 % bolus 500 mL (0 mLs Intravenous Stopped 12/28/22 1200)  iohexol (OMNIPAQUE) 300 MG/ML solution 100 mL (100 mLs Intravenous Contrast Given 12/28/22 1113)     IMPRESSION / MDM / ASSESSMENT AND PLAN / ED COURSE  I reviewed the triage vital signs and the nursing notes. Patient's presentation is most consistent with acute presentation with potential threat to life or bodily function.  Patient presents with abdominal pain as detailed above, differential includes diverticulitis, spasm, colitis, enteritis, less likely UTI, SBO  Will obtain labs, give IV fluids, IV morphine, IV Zofran, obtain CT abdomen pelvis and reevaluate   Lab  work reviewed and is overall reassuring, urinalysis is normal  CT scan demonstrates possible mild colitis no evidence of SBO  Will start the patient on Augmentin, Atarax for itching, Bentyl and brief burst of prednisone to see if this helps with her symptoms, close outpatient follow-up and GI follow-up recommended, no indication admission at this time     FINAL CLINICAL IMPRESSION(S) / ED DIAGNOSES   Final diagnoses:  Colitis     Rx / DC Orders   ED Discharge Orders          Ordered    amoxicillin-clavulanate (AUGMENTIN) 875-125 MG tablet  2 times daily        12/28/22 1151    hydrOXYzine (ATARAX) 25 MG tablet  3 times daily PRN        12/28/22 1152    dicyclomine (BENTYL) 10 MG capsule  3 times daily before meals & bedtime        12/28/22 1203    predniSONE (DELTASONE) 50 MG tablet  Daily with breakfast        12/28/22 1203             Note:  This document  was prepared using Dragon voice recognition software and may include unintentional dictation errors.   Jene Every, MD 12/28/22 1212

## 2023-01-13 DIAGNOSIS — R21 Rash and other nonspecific skin eruption: Secondary | ICD-10-CM | POA: Diagnosis not present

## 2023-01-13 DIAGNOSIS — F1090 Alcohol use, unspecified, uncomplicated: Secondary | ICD-10-CM | POA: Diagnosis not present

## 2023-01-13 DIAGNOSIS — R7989 Other specified abnormal findings of blood chemistry: Secondary | ICD-10-CM | POA: Diagnosis not present

## 2023-01-13 DIAGNOSIS — H5203 Hypermetropia, bilateral: Secondary | ICD-10-CM | POA: Diagnosis not present

## 2023-01-13 DIAGNOSIS — L299 Pruritus, unspecified: Secondary | ICD-10-CM | POA: Diagnosis not present

## 2023-01-13 DIAGNOSIS — H5213 Myopia, bilateral: Secondary | ICD-10-CM | POA: Diagnosis not present

## 2023-01-13 DIAGNOSIS — Z79899 Other long term (current) drug therapy: Secondary | ICD-10-CM | POA: Diagnosis not present

## 2023-01-13 DIAGNOSIS — M359 Systemic involvement of connective tissue, unspecified: Secondary | ICD-10-CM | POA: Diagnosis not present

## 2023-01-26 ENCOUNTER — Ambulatory Visit (INDEPENDENT_AMBULATORY_CARE_PROVIDER_SITE_OTHER): Payer: Medicaid Other | Admitting: Nurse Practitioner

## 2023-01-26 ENCOUNTER — Encounter: Payer: Self-pay | Admitting: Nurse Practitioner

## 2023-01-26 VITALS — BP 122/87 | HR 96 | Temp 98.0°F | Wt 145.2 lb

## 2023-01-26 DIAGNOSIS — M359 Systemic involvement of connective tissue, unspecified: Secondary | ICD-10-CM

## 2023-01-26 DIAGNOSIS — K529 Noninfective gastroenteritis and colitis, unspecified: Secondary | ICD-10-CM

## 2023-01-26 DIAGNOSIS — E785 Hyperlipidemia, unspecified: Secondary | ICD-10-CM | POA: Insufficient documentation

## 2023-01-26 DIAGNOSIS — E782 Mixed hyperlipidemia: Secondary | ICD-10-CM

## 2023-01-26 NOTE — Progress Notes (Signed)
BP 122/87   Pulse 96   Temp 98 F (36.7 C) (Oral)   Wt 145 lb 3.2 oz (65.9 kg)   LMP 04/21/2014 (Approximate)   SpO2 97%   BMI 23.44 kg/m    Subjective:    Patient ID: Frances Davidson, female    DOB: [birth year], 62 y.o.   MRN: [id number]  HPI: Frances Davidson is a 62 y.o. female  Chief Complaint  Patient presents with   Medication Management   Patient presents to clinic to discuss her connective tissue disorder.   Patient states she is still taking the hydroxychloroquine.  She is now on the hydroxyzine which is helping with her itching.  She has seen an eye doctor and she does need bifocals.  Now that she is off the prednisone she is getting a hot firey from her feet.    Patient has ongoing diarrhea.  Has anywhere from 10-20 episodes per day depending on what she eats.  Has not followed up with GI after her ER visit for colitis.    Relevant past medical, surgical, family and social history reviewed and updated as indicated. Interim medical history since our last visit reviewed. Allergies and medications reviewed and updated.  Review of Systems  Gastrointestinal:  Positive for diarrhea.  Musculoskeletal:        Pain and burning in feet  Skin:        Itching and hives    Per HPI unless specifically indicated above     Objective:    BP 122/87   Pulse 96   Temp 98 F (36.7 C) (Oral)   Wt 145 lb 3.2 oz (65.9 kg)   LMP 04/21/2014 (Approximate)   SpO2 97%   BMI 23.44 kg/m   Wt Readings from Last 3 Encounters:  01/26/23 145 lb 3.2 oz (65.9 kg)  10/28/22 148 lb 8 oz (67.4 kg)  08/10/22 146 lb 11.2 oz (66.5 kg)    Physical Exam Vitals and nursing note reviewed.  Constitutional:      General: She is not in acute distress.    Appearance: Normal appearance. She is normal weight. She is not ill-appearing, toxic-appearing or diaphoretic.  HENT:     Head: Normocephalic.     Right Ear: External ear normal.     Left Ear: External ear normal.     Nose: Nose normal.      Mouth/Throat:     Mouth: Mucous membranes are moist.     Pharynx: Oropharynx is clear.  Eyes:     General:        Right eye: No discharge.        Left eye: No discharge.     Extraocular Movements: Extraocular movements intact.     Conjunctiva/sclera: Conjunctivae normal.     Pupils: Pupils are equal, round, and reactive to light.  Cardiovascular:     Rate and Rhythm: Normal rate and regular rhythm.     Heart sounds: No murmur heard. Pulmonary:     Effort: Pulmonary effort is normal. No respiratory distress.     Breath sounds: Normal breath sounds. No wheezing or rales.  Musculoskeletal:     Cervical back: Normal range of motion and neck supple.  Skin:    General: Skin is warm and dry.     Capillary Refill: Capillary refill takes less than 2 seconds.  Neurological:     General: No focal deficit present.     Mental Status: She is alert and oriented to person, place, and time.  Mental status is at baseline.  Psychiatric:        Mood and Affect: Mood normal.        Behavior: Behavior normal.        Thought Content: Thought content normal.        Judgment: Judgment normal.     Results for orders placed or performed during the hospital encounter of 12/28/22  CBC  Result Value Ref Range   WBC 7.5 4.0 - 10.5 K/uL   RBC 3.93 3.87 - 5.11 MIL/uL   Hemoglobin 12.2 12.0 - 15.0 g/dL   HCT 16.1 09.6 - 04.5 %   MCV 95.7 80.0 - 100.0 fL   MCH 31.0 26.0 - 34.0 pg   MCHC 32.4 30.0 - 36.0 g/dL   RDW 40.9 81.1 - 91.4 %   Platelets 209 150 - 400 K/uL   nRBC 0.0 0.0 - 0.2 %  Comprehensive metabolic panel  Result Value Ref Range   Sodium 137 135 - 145 mmol/L   Potassium 3.9 3.5 - 5.1 mmol/L   Chloride 102 98 - 111 mmol/L   CO2 24 22 - 32 mmol/L   Glucose, Bld 80 70 - 99 mg/dL   BUN 9 8 - 23 mg/dL   Creatinine, Ser 7.82 0.44 - 1.00 mg/dL   Calcium 8.8 (L) 8.9 - 10.3 mg/dL   Total Protein 7.0 6.5 - 8.1 g/dL   Albumin 4.1 3.5 - 5.0 g/dL   AST 52 (H) 15 - 41 U/L   ALT 28 0 - 44 U/L    Alkaline Phosphatase 81 38 - 126 U/L   Total Bilirubin 0.7 0.3 - 1.2 mg/dL   GFR, Estimated >95 >62 mL/min   Anion gap 11 5 - 15  Lipase, blood  Result Value Ref Range   Lipase 24 11 - 51 U/L  Urinalysis, Routine w reflex microscopic -Urine, Clean Catch  Result Value Ref Range   Color, Urine YELLOW (A) YELLOW   APPearance CLEAR (A) CLEAR   Specific Gravity, Urine 1.014 1.005 - 1.030   pH 5.0 5.0 - 8.0   Glucose, UA NEGATIVE NEGATIVE mg/dL   Hgb urine dipstick NEGATIVE NEGATIVE   Bilirubin Urine NEGATIVE NEGATIVE   Ketones, ur NEGATIVE NEGATIVE mg/dL   Protein, ur NEGATIVE NEGATIVE mg/dL   Nitrite NEGATIVE NEGATIVE   Leukocytes,Ua NEGATIVE NEGATIVE   WBC, UA 0-5 0 - 5 WBC/hpf   Bacteria, UA NONE SEEN NONE SEEN   Squamous Epithelial / HPF 0-5 0 - 5 /HPF   Mucus PRESENT    Hyaline Casts, UA PRESENT       Assessment & Plan:   Problem List Items Addressed This Visit       Other   Undifferentiated connective tissue disease (HCC) - Primary    Chronic. Followed by Rheumatology.  On Hydroxychloroquine.  Has been weaned off the Prednisone.  Can restart the Gabapentin for the pain and burning in feet.  Continue to follow up with Rheumatology.      Hyperlipidemia    Labs ordered at visit today.  Will make recommendations based on lab results.        Other Visit Diagnoses     Colitis       Ongoing issue.  New referral placed for GI.  Discussed diet changes at visit today.   Relevant Orders   Ambulatory referral to Gastroenterology        Follow up plan: Return in about 3 months (around 04/28/2023) for HTN, HLD, DM2 FU.  A total of 30 minutes were spent on this encounter today.  When total time is documented, this includes both the face-to-face and non-face-to-face time personally spent before, during and after the visit on the date of the encounter symptoms, ER visit, labs, plan of care and follow up.

## 2023-01-26 NOTE — Assessment & Plan Note (Signed)
Labs ordered at visit today.  Will make recommendations based on lab results.   

## 2023-01-26 NOTE — Assessment & Plan Note (Signed)
Chronic. Followed by Rheumatology.  On Hydroxychloroquine.  Has been weaned off the Prednisone.  Can restart the Gabapentin for the pain and burning in feet.  Continue to follow up with Rheumatology.

## 2023-01-28 ENCOUNTER — Telehealth: Payer: Self-pay | Admitting: Obstetrics

## 2023-01-28 NOTE — Telephone Encounter (Signed)
Left message for patient to call office to schedule annual appt with [facility]

## 2023-02-23 ENCOUNTER — Encounter: Payer: Self-pay | Admitting: Nurse Practitioner

## 2023-02-23 MED ORDER — TRIAMCINOLONE ACETONIDE 0.1 % EX CREA: 1.0000 | TOPICAL_CREAM | Freq: Two times a day (BID) | CUTANEOUS | 1 refills | Status: DC

## 2023-02-24 ENCOUNTER — Encounter: Payer: Self-pay | Admitting: Obstetrics

## 2023-02-24 ENCOUNTER — Other Ambulatory Visit (HOSPITAL_COMMUNITY)
Admission: RE | Admit: 2023-02-24 | Discharge: 2023-02-24 | Disposition: A | Discharge status: Home / Self Care | Payer: Medicaid Other | Source: Ambulatory Visit | Attending: Obstetrics | Admitting: Obstetrics

## 2023-02-24 ENCOUNTER — Ambulatory Visit (INDEPENDENT_AMBULATORY_CARE_PROVIDER_SITE_OTHER): Payer: Medicaid Other | Admitting: Obstetrics

## 2023-02-24 VITALS — BP 97/75 | HR 90 | Ht 65.0 in | Wt 143.0 lb

## 2023-02-24 DIAGNOSIS — N898 Other specified noninflammatory disorders of vagina: Secondary | ICD-10-CM

## 2023-02-24 DIAGNOSIS — N941 Unspecified dyspareunia: Secondary | ICD-10-CM

## 2023-02-24 DIAGNOSIS — N3941 Urge incontinence: Secondary | ICD-10-CM

## 2023-02-24 DIAGNOSIS — Z01419 Encounter for gynecological examination (general) (routine) without abnormal findings: Secondary | ICD-10-CM

## 2023-02-24 LAB — POCT URINALYSIS DIPSTICK
Bilirubin, UA: NEGATIVE
Blood, UA: NEGATIVE
Glucose, UA: NEGATIVE
Ketones, UA: NEGATIVE
Leukocytes, UA: NEGATIVE
Nitrite, UA: NEGATIVE
Protein, UA: NEGATIVE
Urobilinogen, UA: 0.2 E.U./dL
pH, UA: 5 (ref 5.0–8.0)

## 2023-02-24 MED ORDER — ESTRADIOL 0.1 MG/GM VA CREA
1.0000 | TOPICAL_CREAM | VAGINAL | 3 refills | Status: DC
Start: 2023-02-24 — End: 2023-03-23

## 2023-02-24 NOTE — Progress Notes (Signed)
Gynecology Annual Exam  PCP: Larae Grooms, NP  Chief Complaint:  Chief Complaint  Patient presents with   Gynecologic Exam    No concerns    History of Present Illness:Patient is a 62 y.o. G3P3003 presents for annual exam. The patient has a few complaints today. She c/o some dyspaeunia, feeling that she has vaginal dryness. She has also been given a new diagnosis of connective tissue disease, and her skin tends to discolor and itch. She is under the care of Rheumatology now and is on several medications. She also reports having some incontinence; and describes that she needs to get to a bathroom quickly if she needs to urinate. She has not had any workup for this. Her mammogram is up to date, and she is not due for a pap smear.  Her husband is with her today.  LMP: Patient's last menstrual period was 04/21/2014 (approximate). She is postmenopausal The patient is sexually active. She admits to dyspareunia.  The patient does perform self breast exams.  There is no notable family history of breast or ovarian cancer in her family.  The patient wears seatbelts: yes.   The patient has regular exercise: yes.    The patient denies current symptoms of depression.     Review of Systems: Review of Systems  Constitutional:  Positive for malaise/fatigue.  HENT: Negative.    Respiratory: Negative.    Cardiovascular: Negative.   Gastrointestinal:  Positive for constipation and diarrhea.  Genitourinary:  Positive for urgency.  Musculoskeletal: Negative.   Skin:  Positive for rash.       Connective tissue disease. She has numerous pigmented marking on her body including facially.  Neurological: Negative.   Psychiatric/Behavioral: Negative.      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   Positive ANA (antinuclear antibody) 04/15/2022    July 2023 -- did  not want to attend rheumatology yet    Rash 04/07/2022   Diarrhea    Heartburn    Erythema of colon    Family history of cancer in mother 02/18/2021    02/18/2021 [facility] testing drawn at [facility] Mother had bladder, ovarian and uterine cancer    H/O appendicitis    Appendiceal abscess     Past Surgical History:  Past Surgical History:  Procedure Laterality Date   BREAST EXCISIONAL BIOPSY Left 1976   COLONOSCOPY WITH PROPOFOL N/A 07/22/2021   Procedure: COLONOSCOPY WITH PROPOFOL;  Surgeon: Pasty Spillers, MD;  Location: [facility] ENDOSCOPY;  Service: Endoscopy;  Laterality: N/A;   ESOPHAGOGASTRODUODENOSCOPY N/A 07/22/2021   Procedure: ESOPHAGOGASTRODUODENOSCOPY (EGD);  Surgeon: Pasty Spillers, MD;  Location: [facility] ENDOSCOPY;  Service: Endoscopy;  Laterality: N/A;   LAPAROSCOPIC APPENDECTOMY N/A 10/27/2016   Procedure: APPENDECTOMY LAPAROSCOPIC;  Surgeon: Henrene Dodge, MD;  Location: [facility] ORS;  Service: General;  Laterality: N/A;   TUBAL LIGATION  1985    Gynecologic History:  Patient's last menstrual period was 04/21/2014 (approximate). Last Pap: Results were: NILM no abnormalities  Last mammogram: 2023 Results were: BI-RAD I  Obstetric History: [id number]  Family History:  Family History  Problem Relation Age of Onset   Diabetes Mother    Bladder Cancer Mother    Cervical cancer Mother    Thyroid cancer Mother    Lung cancer Father    Hypertension Father    Hypertension Brother    Hypertension Daughter  Hypertension Son    Diabetes Son    Cancer Maternal Grandmother    Diabetes Maternal Grandmother    Cancer Maternal Grandfather    Diabetes Maternal Grandfather    Cancer Paternal Grandmother    Diabetes Paternal Grandmother    Cancer Paternal Grandfather    Diabetes Paternal Grandfather    Lymphoma Brother    Breast cancer Neg Hx     Social History:  Social History   Socioeconomic History   Marital status: Married    Spouse name: Not on file    Number of children: 3   Years of education: Not on file   Highest education level: Some college, no degree  Occupational History   Not on file  Tobacco Use   Smoking status: Never   Smokeless tobacco: Never  Vaping Use   Vaping Use: Never used  Substance and Sexual Activity   Alcohol use: Yes    Comment: occasional   Drug use: No   Sexual activity: Yes    Birth control/protection: Post-menopausal  Other Topics Concern   Not on file  Social History Narrative   ** Merged History Encounter **       Social Determinants of Health   Financial Resource Strain: Not on file  Food Insecurity: No Food Insecurity (08/10/2022)   Hunger Vital Sign    Worried About Running Out of Food in the Last Year: Never true    Ran Out of Food in the Last Year: Never true  Transportation Needs: No Transportation Needs (08/10/2022)   PRAPARE - Administrator, Civil Service (Medical): No    Lack of Transportation (Non-Medical): No  Physical Activity: Not on file  Stress: Not on file  Social Connections: Not on file  Intimate Partner Violence: Not on file    Allergies:  No Known Allergies  Medications: Prior to Admission medications   Medication Sig Start Date End Date Taking? Authorizing Provider  atorvastatin (LIPITOR) 20 MG tablet TAKE ONE TABLET BY MOUTH DAILY 10/21/22  Yes Larae Grooms, NP  Calcium Carbonate-Vitamin D (CALCIUM-VITAMIN D) 600-3.125 MG-MCG TABS Take 1 capsule by mouth daily.   Yes [provider]  gabapentin (NEURONTIN) 100 MG capsule Take by mouth. 12/10/22  Yes [provider]  hydroxychloroquine (PLAQUENIL) 200 MG tablet TAKE 1 TABLET BY MOUTH TWICE A DAY ON TUESDAY, THURSDAY, SATURDAY, SUNDAY AND TAKE 1 TABLET ON ALL OTHER DAYS .TAKE WITH FOOD 01/04/23  Yes [provider]  hydrOXYzine (ATARAX) 25 MG tablet Take 1 tablet (25 mg total) by mouth 3 (three) times daily as needed for itching. 12/28/22  Yes Jene Every, MD  Multiple  Vitamin (MULTIVITAMIN) capsule Take 1 capsule by mouth daily.   Yes [provider]  omeprazole (PRILOSEC OTC) 20 MG tablet Take 1 tablet (20 mg total) by mouth daily. 06/11/21  Yes Larae Grooms, NP  triamcinolone cream (KENALOG) 0.1 % Apply 1 Application topically 2 (two) times daily. 02/23/23  Yes Larae Grooms, NP    Physical Exam Vitals: Blood pressure 97/75, pulse 90, height 5\' 5"  (1.651 m), weight 143 lb (64.9 kg), last menstrual period 04/21/2014.  General: NAD HEENT: normocephalic, anicteric Thyroid: no enlargement, no palpable nodules Pulmonary: No increased work of breathing, CTAB Cardiovascular: RRR, distal pulses 2+ Breast: Breast symmetrical,E-F cup, pendulous,  no tenderness, no palpable nodules or masses, no skin or nipple retraction present, no nipple discharge.  No axillary or supraclavicular lymphadenopathy. Abdomen: NABS, soft, non-tender, non-distended.  Umbilicus without lesions.  No hepatomegaly,  splenomegaly or masses palpable. No evidence of hernia  Genitourinary:  External: Normal external female genitalia.  Normal urethral meatus, normal Bartholin's and Skene's glands.    Vagina: Normal vaginal mucosa,  no obvious discharge  Cervix: Grossly normal in appearance, no bleeding  Uterus: slight prolapse noted,Non-enlarged, mobile, normal contour.  No CMT  Adnexa: ovaries non-enlarged, no adnexal masses  Rectal: deferred. + hemorrhoids noted.  Lymphatic: no evidence of inguinal lymphadenopathy Extremities: no edema, erythema, or tenderness Neurologic: Grossly intact Psychiatric: mood appropriate, affect full  Female chaperone present for pelvic and breast  portions of the physical exam     Assessment: 62 y.o. G3P3003 routine annual exam Urge incontinence dyspareunia  Plan: Problem List Items Addressed This Visit   None Visit Diagnoses     Encounter for annual routine gynecological examination    -  Primary       1) Mammogram - recommend  yearly screening mammogram.  Mammogram  has been ordered by her PCP  2) STI screening  wasoffered and declined  3) ASCCP guidelines and rational discussed.  Patient opts for every 5 years screening interval  4) Osteoporosis  - per USPTF routine screening DEXA at age 26    5) Routine healthcare maintenance including cholesterol, diabetes screening discussed managed by PCP  6) Colonoscopy per her PCP.  Screening recommended starting at age 54 for average risk individuals, age 98 for individuals deemed at increased risk (including African Americans) and recommended to continue until age 36.  For patient age 20-85 individualized approach is recommended.  Gold standard screening is via colonoscopy, Cologuard screening is an acceptable alternative for patient unwilling or unable to undergo colonoscopy.  "Colorectal cancer screening for average?risk adults: 2018 guideline update from the American Cancer Society"CA: A Cancer Journal for Clinicians: Feb 17, 2017    We discussed the use of lubrication with IC. Samples of Uberlube provided today. In addition I discussed the possiblity of some Estrace cream for her use vaginally. She will consult with her PCP and Rheumatologist re this is appropriate in light of her other medications. I have ordered her some Estrace cream 7) No follow-ups on file.   Mirna Mires, CNM  02/24/2023 8:42 AM   02/24/2023, 8:42 AM

## 2023-02-25 ENCOUNTER — Encounter: Payer: Self-pay | Admitting: Obstetrics

## 2023-02-25 ENCOUNTER — Other Ambulatory Visit: Payer: Self-pay | Admitting: Obstetrics

## 2023-02-25 DIAGNOSIS — N760 Acute vaginitis: Secondary | ICD-10-CM

## 2023-02-25 LAB — CERVICOVAGINAL ANCILLARY ONLY
Bacterial Vaginitis (gardnerella): POSITIVE — AB
Candida Glabrata: NEGATIVE
Candida Vaginitis: NEGATIVE
Comment: NEGATIVE
Comment: NEGATIVE
Comment: NEGATIVE

## 2023-02-25 MED ORDER — METRONIDAZOLE 500 MG PO TABS
500.0000 mg | ORAL_TABLET | Freq: Two times a day (BID) | ORAL | 0 refills | Status: AC
Start: 2023-02-25 — End: 2023-03-04

## 2023-02-25 NOTE — Progress Notes (Signed)
Aptima swab reveals + BV. Pt notified via [facility] and Rx sent in for Metronidazole. Mirna Mires, CNM  02/25/2023 3:32 PM

## 2023-03-03 ENCOUNTER — Other Ambulatory Visit: Payer: Self-pay | Admitting: Nurse Practitioner

## 2023-03-04 NOTE — Telephone Encounter (Signed)
Requested medication (s) are due for refill today: na  Requested medication (s) are on the active medication list: yes   Last refill:  10/21/22 #90 0 refills  Future visit scheduled: yes in 1 month   Notes to clinic:  protocol failed last labs 03/10/21. No refills remain. Do you want to refill Rx?     Requested Prescriptions  Pending Prescriptions Disp Refills   atorvastatin (LIPITOR) 20 MG tablet [Pharmacy Med Name: ATORVASTATIN 20 MG TABLET] 90 tablet 0    Sig: TAKE 1 TABLET BY MOUTH DAILY     Cardiovascular:  Antilipid - Statins Failed - 03/03/2023  1:45 PM      Failed - Lipid Panel in normal range within the last 12 months    Cholesterol, Total  Date Value Ref Range Status  03/10/2021 197 100 - 199 mg/dL Final   LDL Chol Calc (NIH)  Date Value Ref Range Status  03/10/2021 58 0 - 99 mg/dL Final   HDL  Date Value Ref Range Status  03/10/2021 112 >39 mg/dL Final   Triglycerides  Date Value Ref Range Status  03/10/2021 169 (H) 0 - 149 mg/dL Final         Passed - Patient is not pregnant      Passed - Valid encounter within last 12 months    Recent Outpatient Visits           1 month ago Undifferentiated connective tissue disease (HCC)   [place] [facility] Larae Grooms, NP   4 months ago Elevated LFTs   [place] [facility] Larae Grooms, NP   7 months ago Undifferentiated connective tissue disease [facility])   [place] [facility] Larae Grooms, NP   9 months ago Rash   [place] [facility] Larae Grooms, NP   10 months ago Positive ANA (antinuclear antibody)   [place] [facility] Larae Grooms, NP       Future Appointments             In 1 month Mecum, Oswaldo Conroy, PA-C [facility] [facility]

## 2023-03-22 NOTE — Progress Notes (Signed)
GYNECOLOGY PROGRESS NOTE Subjective:    Frances Davidson is a 62 y.o. female who presents for evaluation of a cystocele. Problem started 1  year  ago. Symptoms include: prolapse of tissue with straining and urinary hesitancy (notes she has to sit for up to 5 minutes prior to voiding).  Also notes der emptying urgency when changing from sitting to standing position, however denies actual leakage of urine on these occasions . Symptoms have gradually worsened.  Does note a family history of bladder cancer in her mother (dx age 36).    Menstrual History: OB History     Gravida  3   Para  3   Term  3   Preterm      AB      Living  3      SAB      IAB      Ectopic      Multiple      Live Births  3           Patient's last menstrual period was 04/21/2014 (approximate).    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  She  has a past surgical history that includes Tubal ligation (1610); laparoscopic appendectomy (N/A, 10/27/2016); Breast excisional biopsy (Left, 1976); Colonoscopy with propofol (N/A, 07/22/2021); and Esophagogastroduodenoscopy (N/A, 07/22/2021).  Her family history includes Bladder Cancer in her mother; Cancer in her maternal grandfather, maternal grandmother, paternal grandfather, and paternal grandmother; Cervical cancer in her mother; Diabetes in her maternal grandfather, maternal grandmother, mother, paternal grandfather, paternal grandmother, and son; Hypertension in her brother, daughter, father, and son; Lung cancer in her father; Lymphoma in her brother; Thyroid cancer in her mother.   She  reports that she has never smoked. She has never used smokeless tobacco. She reports current alcohol use. She reports that she does not use  drugs.   Current Outpatient Medications on File Prior to Visit  Medication Sig Dispense Refill   atorvastatin (LIPITOR) 20 MG tablet TAKE 1 TABLET BY MOUTH DAILY 90 tablet 1   Calcium Carbonate-Vitamin D (CALCIUM-VITAMIN D) 600-3.125 MG-MCG TABS Take 1 capsule by mouth daily.     gabapentin (NEURONTIN) 100 MG capsule Take by mouth.     hydroxychloroquine (PLAQUENIL) 200 MG tablet TAKE 1 TABLET BY MOUTH TWICE A DAY ON TUESDAY, THURSDAY, SATURDAY, SUNDAY AND TAKE 1 TABLET ON ALL OTHER DAYS .TAKE WITH FOOD     hydrOXYzine (ATARAX) 25 MG tablet Take 1 tablet (25 mg total) by mouth 3 (three) times daily as needed for itching. 60 tablet 1   Multiple Vitamin (MULTIVITAMIN) capsule Take 1 capsule by mouth daily.     omeprazole (PRILOSEC OTC) 20 MG tablet Take 1 tablet (20 mg total) by mouth daily. 90 tablet 1   triamcinolone cream (KENALOG) 0.1 % Apply 1 Application topically 2 (two) times daily. 80 g 1   No current facility-administered medications on file prior to visit.   She has No Known Allergies..  Review of Systems Pertinent items noted in HPI and remainder of comprehensive ROS otherwise negative.   Objective:     BP 98/79   Pulse 92   Resp 16   Ht 5\' 5"  (1.651 m)   Wt 145 lb 9.6 oz (66 kg)   LMP 04/21/2014 (Approximate)   BMI 24.23 kg/m  BP 98/79   Pulse 92   Resp 16   Ht 5\' 5"  (1.651 m)   Wt 145 lb 9.6 oz (66 kg)   LMP 04/21/2014 (Approximate)   BMI 24.23 kg/m  General App: No acute distress Abdomen: soft, non-tender; bowel sounds normal; no masses,  no organomegaly Pelvic exam: normal external genitalia, vulva, vagina, cervix, uterus and adnexa VULVA: normal appearing vulva with no masses, tenderness or lesions VAGINA: mildly atrophic, no discharge or lesions. PELVIC FLOOR EXAM: rectocele Grade 1, cystocele Grade 2.  CERVIX: normal appearing cervix without discharge or lesions UTERUS: uterus is normal size, shape, consistency and non-tender. Mild Grade 1-2  descensus. ADNEXA: normal adnexa in size, nontender and no masses, RECTAL: external hemorrhoids non-thrombosed. Neurologic: Grossly normal  Assessment:   The patient has a cystocele with small rectocele.   Plan:   Discussed cystoceles/rectoceles and management options with the patient. All questions answered. Agricultural engineer distributed. Conservative approach with Kegal exercises. Discussed pessary and will plan visit for fitting. Discussed surgical repair. Patient declines at this time.    Hildred Laser, MD [facility]

## 2023-03-23 ENCOUNTER — Encounter: Payer: Self-pay | Admitting: Obstetrics and Gynecology

## 2023-03-23 ENCOUNTER — Ambulatory Visit (INDEPENDENT_AMBULATORY_CARE_PROVIDER_SITE_OTHER): Payer: Medicaid Other | Admitting: Obstetrics and Gynecology

## 2023-03-23 VITALS — BP 98/79 | HR 92 | Resp 16 | Ht 65.0 in | Wt 145.6 lb

## 2023-03-23 DIAGNOSIS — N814 Uterovaginal prolapse, unspecified: Secondary | ICD-10-CM | POA: Diagnosis not present

## 2023-03-23 DIAGNOSIS — R3911 Hesitancy of micturition: Secondary | ICD-10-CM

## 2023-03-23 NOTE — Patient Instructions (Signed)
How to Use a Vaginal Pessary  A vaginal pessary is a removable device that is placed into your vagina to support pelvic organs that droop. These organs include your uterus, bladder, and rectum. When your pelvic organs drop down into your vagina, it causes a condition called pelvic organ prolapse (POP). A pessary may be an alternative to surgery for women with POP. It may help women who leak urine when they strain or exercise (stress incontinence). This is a symptom of POP. A vaginal pessary may also be a temporary treatment for stress incontinence during pregnancy. There are several types of pessaries. All types are usually made of silicone. You can insert and remove some on your own. Other types must be inserted and removed by your health care provider at office visits. The reason you are using a pessary and the severity of your condition will determine which one is best for you. It is also important to find the right size. A pessary that is too small may fall out. A pessary that is too large may cause pain or discomfort. Your health care provider will do a physical exam to find the correct size and fit for your pessary. It may take several appointments to find the best fit for you. If you can be fit with the type of pessary that you can insert, remove, and clean yourself, your health care provider will teach you how to use your pessary at home. You may have checkups every few months. If you have the type of pessary that needs to be inserted and removed by your health care provider, you will have appointments every few months to have the pessary removed, cleaned, and replaced. What are the risks? When properly fitted and cared for, risks of using a vaginal pessary can be small. However, there can be problems that may include: Vaginal discharge. Vaginal bleeding. A bad smell coming from your vagina. Scraping of the skin inside your vagina. How to use your pessary Follow your health care provider's  instructions for using a pessary. These instructions may vary, depending on the type of pessary you have. To insert a pessary: Wash your hands with soap and water for at least 20 seconds. Squeeze or fold the pessary in half and lubricate the tip with a water-based lubricant. Insert the pessary into your vagina. It will unfold and provide support. To remove the pessary, gently tug it out of your vagina. You can remove the pessary every night or after several days. You can also remove it to have sex. How to care for your pessary If you have a pessary that you can remove: Clean your pessary with soap and water. Rinse well. Dry it completely before inserting it back into your vagina. Follow these instructions at home: Take over-the-counter and prescription medicines only as told by your health care provider. Your health care provider may prescribe an estrogen cream to moisten your vagina. Keep all follow-up visits. This is important. Contact a health care provider if: You feel any pain or discomfort when your pessary is in place. You continue to have stress incontinence. You have trouble keeping your pessary from falling out. You have an unusual vaginal discharge that is blood-tinged or smells bad. Summary A vaginal pessary is a removable device that is placed into your vagina to support pelvic organs that droop. This condition is called pelvic organ prolapse (POP). There are several types of pessaries. Some you can insert and remove on your own. Others must be inserted and   removed by your health care provider. The best type for you depends on the reason you are using a pessary and the severity of your condition. It is also important to find the right size. If you can use the type that you insert and remove on your own, your health care provider will teach you how to use it and schedule checkups every few months. If you have the type that needs to be inserted and removed by your health care  provider, you will have regular appointments to have your pessary removed, cleaned, and replaced. This information is not intended to replace advice given to you by your health care provider. Make sure you discuss any questions you have with your health care provider. Document Revised: 03/07/2020 Document Reviewed: 03/07/2020 Elsevier Patient Education  2024 Elsevier Inc.  

## 2023-04-12 NOTE — Progress Notes (Unsigned)
    GYNECOLOGY PROGRESS NOTE  Subjective:    Patient ID: Frances Davidson, female    DOB: [birth year], 62 y.o.   MRN: [id number]  HPI  Patient is a 62 y.o. G64P3003 female who presents for pessary fitting. She has a cystocele. prolapse of tissue with straining and urinary hesitancy (notes she has to sit for up to 5 minutes prior to voiding).  Also notes der emptying urgency when changing from sitting to standing position, however denies actual leakage of urine on these occasions . Symptoms have gradually worsened.  Does note a family history of bladder cancer in her mother (dx age 63).   {Common ambulatory SmartLinks:19316}  Review of Systems {ros; complete:30496}   Objective:   Last menstrual period 04/21/2014. There is no height or weight on file to calculate BMI. General appearance: {general exam:16600} Abdomen: {abdominal exam:16834} Pelvic: {pelvic exam:16852::"cervix normal in appearance","external genitalia normal","no adnexal masses or tenderness","no cervical motion tenderness","rectovaginal septum normal","uterus normal size, shape, and consistency","vagina normal without discharge"} Extremities: {extremity exam:5109} Neurologic: {neuro exam:17854}   Assessment:   No diagnosis found.   Plan:   There are no diagnoses linked to this encounter.

## 2023-04-13 ENCOUNTER — Ambulatory Visit (INDEPENDENT_AMBULATORY_CARE_PROVIDER_SITE_OTHER): Payer: Medicaid Other | Admitting: Obstetrics and Gynecology

## 2023-04-13 ENCOUNTER — Encounter: Payer: Self-pay | Admitting: Obstetrics and Gynecology

## 2023-04-13 VITALS — BP 102/80 | HR 99 | Resp 16 | Ht 65.0 in | Wt 142.9 lb

## 2023-04-13 DIAGNOSIS — Z4689 Encounter for fitting and adjustment of other specified devices: Secondary | ICD-10-CM

## 2023-04-13 DIAGNOSIS — N3941 Urge incontinence: Secondary | ICD-10-CM

## 2023-04-13 DIAGNOSIS — N941 Unspecified dyspareunia: Secondary | ICD-10-CM

## 2023-04-13 DIAGNOSIS — R3911 Hesitancy of micturition: Secondary | ICD-10-CM

## 2023-04-13 DIAGNOSIS — N814 Uterovaginal prolapse, unspecified: Secondary | ICD-10-CM

## 2023-04-14 NOTE — Telephone Encounter (Signed)
Pt saw Dr. Valentino Saxon on 04/13/23 for a pessary fitting.

## 2023-04-20 DIAGNOSIS — M359 Systemic involvement of connective tissue, unspecified: Secondary | ICD-10-CM | POA: Diagnosis not present

## 2023-04-20 DIAGNOSIS — R202 Paresthesia of skin: Secondary | ICD-10-CM | POA: Diagnosis not present

## 2023-04-22 ENCOUNTER — Telehealth: Payer: Self-pay | Admitting: Obstetrics and Gynecology

## 2023-04-22 NOTE — Telephone Encounter (Signed)
Pessary arrived. Please contact patient to schedule appt for placement. Left voicemail for the patient to be scheduled with Dr. Valentino Saxon first openings 8/15 to offer for scheduling.

## 2023-04-22 NOTE — Telephone Encounter (Signed)
The patient contacted the office for scheduling. She is schedule for 8/15 with Dr. Valentino Saxon.

## 2023-04-28 ENCOUNTER — Ambulatory Visit: Payer: Medicaid Other | Admitting: Physician Assistant

## 2023-05-04 ENCOUNTER — Ambulatory Visit: Payer: Medicaid Other | Admitting: Gastroenterology

## 2023-05-06 ENCOUNTER — Ambulatory Visit: Payer: Medicaid Other | Admitting: Physician Assistant

## 2023-05-06 ENCOUNTER — Ambulatory Visit: Payer: Medicaid Other | Admitting: Obstetrics and Gynecology

## 2023-05-06 VITALS — BP 99/68 | HR 84 | Ht 65.0 in | Wt 139.4 lb

## 2023-05-06 DIAGNOSIS — E782 Mixed hyperlipidemia: Secondary | ICD-10-CM

## 2023-05-06 DIAGNOSIS — R21 Rash and other nonspecific skin eruption: Secondary | ICD-10-CM | POA: Diagnosis not present

## 2023-05-06 DIAGNOSIS — Z131 Encounter for screening for diabetes mellitus: Secondary | ICD-10-CM | POA: Diagnosis not present

## 2023-05-06 DIAGNOSIS — R0602 Shortness of breath: Secondary | ICD-10-CM

## 2023-05-06 DIAGNOSIS — M359 Systemic involvement of connective tissue, unspecified: Secondary | ICD-10-CM | POA: Diagnosis not present

## 2023-05-06 NOTE — Progress Notes (Signed)
Your spirometry (lung testing done in the office) was normal at this time.

## 2023-05-06 NOTE — Progress Notes (Signed)
Established Office Visit   Patient: Frances Davidson   DOB: 1961/05/16   62 y.o. Female  MRN: [id number] Visit Date: 05/06/2023  Today's healthcare provider: Oswaldo Conroy Tameika Heckmann, PA-C  Introduced myself to the patient as a Secondary school teacher and provided education on APPs in clinical practice.    Chief Complaint  Patient presents with   Hyperlipidemia   Subjective    HPI   HYPERLIPIDEMIA Hyperlipidemia status: good compliance Satisfied with current treatment?  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  Rheumatology recommended she goes to Dermatology and Neurology  Based on chart review it appears Rheum has made referral to Neurology and she did have Derm apt  She states today that she had to cancel derm apt    She reports she has been getting winded with mild exertion over the last 6 months She states standing in line at the market or walking across the parking lot can cause her to have SOB She denies previous known hx of breathing condition such as asthma or COPD She reports standing to cook or cleaning around the house causes SOB  She also reports joint pain with walking for prolonged periods   Medications: Outpatient Medications Prior to Visit  Medication Sig   atorvastatin (LIPITOR) 20 MG tablet TAKE 1 TABLET BY MOUTH DAILY   Calcium Carbonate-Vitamin D (CALCIUM-VITAMIN D) 600-3.125 MG-MCG TABS Take 1 capsule by mouth daily.   gabapentin (NEURONTIN) 100 MG capsule Take by mouth.   hydroxychloroquine (PLAQUENIL) 200 MG tablet TAKE 1 TABLET BY MOUTH TWICE A DAY ON TUESDAY, THURSDAY, SATURDAY, SUNDAY AND TAKE 1 TABLET ON ALL OTHER DAYS .TAKE WITH  FOOD   Multiple Vitamin (MULTIVITAMIN) capsule Take 1 capsule by mouth daily.   omeprazole (PRILOSEC OTC) 20 MG tablet Take 1 tablet (20 mg total) by mouth daily.   triamcinolone cream (KENALOG) 0.1 % Apply 1 Application topically 2 (two) times daily.   hydrOXYzine (ATARAX) 25 MG tablet Take 1 tablet (25 mg total) by mouth 3 (three) times daily as needed for itching. (Patient not taking: Reported on 05/06/2023)   No facility-administered medications prior to visit.    Review of Systems  Respiratory:  Positive for shortness of breath. Negative for cough, chest tightness and wheezing.   Cardiovascular:  Negative for chest pain, palpitations and leg swelling.        Objective    BP 99/68   Pulse 84   Ht 5\' 5"  (1.651 m)   Wt 139 lb 6.4 oz (63.2 kg)   LMP 04/21/2014 (Approximate)   SpO2 100%   BMI 23.20 kg/m     Physical Exam Vitals reviewed.  Constitutional:      General: She is awake.     Appearance: Normal appearance. She is well-developed and well-groomed.  HENT:     Head: Normocephalic and atraumatic.  Cardiovascular:     Rate and Rhythm: Normal rate and regular rhythm.     Heart sounds: Normal heart sounds. No murmur heard.    No friction rub. No gallop.  Pulmonary:     Effort: Pulmonary effort is normal.     Breath sounds: Normal breath sounds.  No decreased air movement. No decreased breath sounds, wheezing, rhonchi or rales.  Musculoskeletal:        General: Normal range of motion.     Cervical back: Normal range of motion.  Neurological:     Mental Status: She is alert.  Psychiatric:        Attention and Perception: She is inattentive.        Mood and Affect: Affect is inappropriate.        Behavior: Behavior is uncooperative.       Results for orders placed or performed in visit on 05/06/23  HgB A1c  Result Value Ref Range   Hgb A1c MFr Bld 4.9 4.8 - 5.6 %   Est. average glucose Bld gHb Est-mCnc 94 mg/dL  Comp Met (CMET)  Result Value Ref Range    Glucose 77 70 - 99 mg/dL   BUN 5 (L) 8 - 27 mg/dL   Creatinine, Ser 2.95 0.57 - 1.00 mg/dL   eGFR 80 >62 ZH/YQM/5.78   BUN/Creatinine Ratio 6 (L) 12 - 28   Sodium 138 134 - 144 mmol/L   Potassium 4.5 3.5 - 5.2 mmol/L   Chloride 103 96 - 106 mmol/L   CO2 21 20 - 29 mmol/L   Calcium 9.2 8.7 - 10.3 mg/dL   Total Protein 6.4 6.0 - 8.5 g/dL   Albumin 4.1 3.9 - 4.9 g/dL   Globulin, Total 2.3 1.5 - 4.5 g/dL   Bilirubin Total 0.4 0.0 - 1.2 mg/dL   Alkaline Phosphatase 93 44 - 121 IU/L   AST 73 (H) 0 - 40 IU/L   ALT 65 (H) 0 - 32 IU/L  CBC w/Diff  Result Value Ref Range   WBC 5.1 3.4 - 10.8 x10E3/uL   RBC 3.77 3.77 - 5.28 x10E6/uL   Hemoglobin 10.9 (L) 11.1 - 15.9 g/dL   Hematocrit 46.9 (L) 62.9 - 46.6 %   MCV 90 79 - 97 fL   MCH 28.9 26.6 - 33.0 pg   MCHC 32.2 31.5 - 35.7 g/dL   RDW 52.8 41.3 - 24.4 %   Platelets 241 150 - 450 x10E3/uL   Neutrophils 53 Not Estab. %   Lymphs 26 Not Estab. %   Monocytes 8 Not Estab. %   Eos 11 Not Estab. %   Basos 1 Not Estab. %   Neutrophils Absolute 2.7 1.4 - 7.0 x10E3/uL   Lymphocytes Absolute 1.3 0.7 - 3.1 x10E3/uL   Monocytes Absolute 0.4 0.1 - 0.9 x10E3/uL   EOS (ABSOLUTE) 0.6 (H) 0.0 - 0.4 x10E3/uL   Basophils Absolute 0.0 0.0 - 0.2 x10E3/uL   Immature Granulocytes 1 Not Estab. %   Immature Grans (Abs) 0.0 0.0 - 0.1 x10E3/uL  Lipid Profile  Result Value Ref Range   Cholesterol, Total 202 (H) 100 - 199 mg/dL   Triglycerides 75 0 - 149 mg/dL   HDL 010 >27 mg/dL   VLDL Cholesterol Cal 13 5 - 40 mg/dL   LDL Chol Calc (NIH) 75 0 - 99 mg/dL   Chol/HDL Ratio 1.8 0.0 - 4.4 ratio    Assessment & Plan      Return in about 3 months (around 08/06/2023) for HLD, autoimmune .      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    Chronic, ongoing She is followed by Rheumatology and taking Plaquenil 200 mg and Gabapentin 100 mg  Will continue collaboration with Rheumatology as needed Follow up in 6 months or sooner if concerns arise        Relevant Orders   Comp Met (CMET) (Completed)   CBC w/Diff (Completed)   Ambulatory referral to Dermatology   Hyperlipidemia    Chronic historic condition She was started on Atorvastatin 20 mg PO every day and appears to be tolerating well Recheck lipid panel today- results to dictate further management Continue current regimen for now Follow up in 6 months or sooner if concerns arise        Relevant Orders   HgB A1c (Completed)   Lipid Profile (Completed)   Other Visit Diagnoses     Screening for diabetes mellitus (DM)       Relevant Orders   HgB A1c (Completed)   SOB (shortness of breath) on exertion     Unsure of chronicity Pt reports recurrent SOB with minor exertion  Will get Spirometry today for evaluation, PE was overall normal Results to dictate further management Follow up as needed for persistent or progressing symptoms     Relevant Orders   Spirometry with Graph (Completed)        Return in about 3 months (around 08/06/2023) for HLD, autoimmune .   I, Kidada Ging E Dulcey Riederer, PA-C, have reviewed all documentation for this visit. The documentation on 05/07/23 for the exam, diagnosis, procedures, and orders are all accurate and complete.   Jacquelin Hawking, MHS, PA-C [facility]

## 2023-05-07 LAB — LIPID PANEL
Chol/HDL Ratio: 1.8 ratio (ref 0.0–4.4)
Cholesterol, Total: 202 mg/dL — ABNORMAL HIGH (ref 100–199)
HDL: 114 mg/dL (ref >39)
LDL Chol Calc (NIH): 75 mg/dL (ref 0–99)
Triglycerides: 75 mg/dL (ref 0–149)
VLDL Cholesterol Cal: 13 mg/dL (ref 5–40)

## 2023-05-07 LAB — CBC WITH DIFFERENTIAL/PLATELET
Basophils Absolute: 0 10*3/uL (ref 0.0–0.2)
Basos: 1 %
EOS (ABSOLUTE): 0.6 10*3/uL — ABNORMAL HIGH (ref 0.0–0.4)
Eos: 11 %
Hematocrit: 33.8 % — ABNORMAL LOW (ref 34.0–46.6)
Hemoglobin: 10.9 g/dL — ABNORMAL LOW (ref 11.1–15.9)
Immature Grans (Abs): 0 10*3/uL (ref 0.0–0.1)
Immature Granulocytes: 1 %
Lymphocytes Absolute: 1.3 10*3/uL (ref 0.7–3.1)
Lymphs: 26 %
MCH: 28.9 pg (ref 26.6–33.0)
MCHC: 32.2 g/dL (ref 31.5–35.7)
MCV: 90 fL (ref 79–97)
Monocytes Absolute: 0.4 10*3/uL (ref 0.1–0.9)
Monocytes: 8 %
Neutrophils Absolute: 2.7 10*3/uL (ref 1.4–7.0)
Neutrophils: 53 %
Platelets: 241 10*3/uL (ref 150–450)
RBC: 3.77 x10E6/uL (ref 3.77–5.28)
RDW: 14.3 % (ref 11.7–15.4)
WBC: 5.1 10*3/uL (ref 3.4–10.8)

## 2023-05-07 LAB — COMPREHENSIVE METABOLIC PANEL
ALT: 65 IU/L — ABNORMAL HIGH (ref 0–32)
AST: 73 IU/L — ABNORMAL HIGH (ref 0–40)
Albumin: 4.1 g/dL (ref 3.9–4.9)
Alkaline Phosphatase: 93 IU/L (ref 44–121)
BUN/Creatinine Ratio: 6 — ABNORMAL LOW (ref 12–28)
BUN: 5 mg/dL — ABNORMAL LOW (ref 8–27)
Bilirubin Total: 0.4 mg/dL (ref 0.0–1.2)
CO2: 21 mmol/L (ref 20–29)
Calcium: 9.2 mg/dL (ref 8.7–10.3)
Chloride: 103 mmol/L (ref 96–106)
Creatinine, Ser: 0.82 mg/dL (ref 0.57–1.00)
Globulin, Total: 2.3 g/dL (ref 1.5–4.5)
Glucose: 77 mg/dL (ref 70–99)
Potassium: 4.5 mmol/L (ref 3.5–5.2)
Sodium: 138 mmol/L (ref 134–144)
Total Protein: 6.4 g/dL (ref 6.0–8.5)
eGFR: 80 mL/min/{1.73_m2} (ref >59)

## 2023-05-07 LAB — HEMOGLOBIN A1C
Est. average glucose Bld gHb Est-mCnc: 94 mg/dL
Hgb A1c MFr Bld: 4.9 % (ref 4.8–5.6)

## 2023-05-07 NOTE — Assessment & Plan Note (Signed)
Chronic historic condition She was started on Atorvastatin 20 mg PO every day and appears to be tolerating well Recheck lipid panel today- results to dictate further management Continue current regimen for now Follow up in 6 months or sooner if concerns arise

## 2023-05-07 NOTE — Assessment & Plan Note (Signed)
Chronic, ongoing She is followed by Rheumatology and taking Plaquenil 200 mg and Gabapentin 100 mg  Will continue collaboration with Rheumatology as needed Follow up in 6 months or sooner if concerns arise

## 2023-05-07 NOTE — Progress Notes (Signed)
Your labs are back  Your Electrolytes appear to be in overall normal ranges. Your liver enzymes are a bit elevated but they appear to be improving from your labs a week ago Your CBC shows that you might be anemic or having some blood loss. Your hemoglobin is lower than it was a week ago. Are you having an signs of bleeding such as dark stools or recent trauma? Please let us know so we can refer you to the appropriate intervention Your cholesterol looks pretty good- please continue your current medications. Your A1c was normal Let us know if you have further questions or concerns.

## 2023-05-07 NOTE — Assessment & Plan Note (Signed)
Chronic, ongoing She was evaluated for this with Rheumatology and that provider recommended Derm evaluation Will place referral as patient states her previous Derm office no longer takes her insurance  Will defer to Dermatology recommendations for management

## 2023-05-25 ENCOUNTER — Encounter: Payer: Self-pay | Admitting: Obstetrics and Gynecology

## 2023-05-25 ENCOUNTER — Ambulatory Visit: Payer: Self-pay | Admitting: *Deleted

## 2023-05-25 ENCOUNTER — Ambulatory Visit (INDEPENDENT_AMBULATORY_CARE_PROVIDER_SITE_OTHER): Payer: Medicaid Other | Admitting: Obstetrics and Gynecology

## 2023-05-25 VITALS — BP 100/81 | HR 94 | Resp 16 | Ht 65.0 in | Wt 141.8 lb

## 2023-05-25 DIAGNOSIS — R3911 Hesitancy of micturition: Secondary | ICD-10-CM

## 2023-05-25 DIAGNOSIS — N952 Postmenopausal atrophic vaginitis: Secondary | ICD-10-CM

## 2023-05-25 DIAGNOSIS — N814 Uterovaginal prolapse, unspecified: Secondary | ICD-10-CM

## 2023-05-25 DIAGNOSIS — N941 Unspecified dyspareunia: Secondary | ICD-10-CM

## 2023-05-25 DIAGNOSIS — N3941 Urge incontinence: Secondary | ICD-10-CM

## 2023-05-25 DIAGNOSIS — Z4689 Encounter for fitting and adjustment of other specified devices: Secondary | ICD-10-CM | POA: Diagnosis not present

## 2023-05-25 MED ORDER — PREMARIN 0.625 MG/GM VA CREA: TOPICAL_CREAM | VAGINAL | 6 refills | Status: DC

## 2023-05-25 NOTE — Progress Notes (Signed)
    GYNECOLOGY PROGRESS NOTE  Subjective:    Patient ID: Frances Davidson, female    DOB: 24-Sep-1960, 62 y.o.   MRN: [id number]  HPI  Patient is a 62 y.o. G64P3003 female who presents for pessary insertion. She has a cystocele with small rectocele and incomplete uterine prolapse.  She has noted prolapse of tissue with straining and urinary hesitancy (notes she has to sit for up to 5 minutes prior to voiding).  Also experiences urgency when changing from sitting to standing position, however denies actual leakage of urine on these occasions .   She was fitted for pessary at previous visit, Size 2 ring with support.   Of note, patient reports recent passing of her mother 3 weeks ago, and also her mother in law within the past week.  Has been doing more traveling to [place] and feels her symptoms have worsened over the past several weeks.     The following portions of the patient's history were reviewed and updated as appropriate: allergies, current medications, past family history, past medical history, past social history, past surgical history, and problem list.  Review of Systems Genitourinary:positive for vaginal dryness and discomfort with intercourse   Objective:   Blood pressure (!) 89/67, pulse (!) 102, resp. rate 16, height 5\' 5"  (1.651 m), weight 141 lb 12.8 oz (64.3 kg), last menstrual period 04/21/2014. Body mass index is 23.6 kg/m. General appearance: alert and no distress Pelvis: external genitalia normal, internal exam deferred. Size 2 ring with support inserted today.    Assessment:   1. Cystocele with small rectocele and uterine descent   2. Urinary hesitancy   3. Urge incontinence of urine   4. Dyspareunia in female   5. Vaginal atrophy      Plan:   RTC in 2 weeks for pessary check. Can advise on self removal and reinsertion at next visit.  2. Will prescribe Premarin cream for pessary maintenance.  Also will be helpful for vaginal atrophy and dyspareunia.      Hildred Laser, MD  [facility]

## 2023-05-25 NOTE — Patient Instructions (Signed)
How to Use a Vaginal Pessary  A vaginal pessary is a removable device that is placed into your vagina to support pelvic organs that droop. These organs include your uterus, bladder, and rectum. When your pelvic organs drop down into your vagina, it causes a condition called pelvic organ prolapse (POP). A pessary may be an alternative to surgery for women with POP. It may help women who leak urine when they strain or exercise (stress incontinence). This is a symptom of POP. A vaginal pessary may also be a temporary treatment for stress incontinence during pregnancy. There are several types of pessaries. All types are usually made of silicone. You can insert and remove some on your own. Other types must be inserted and removed by your health care provider at office visits. The reason you are using a pessary and the severity of your condition will determine which one is best for you. It is also important to find the right size. A pessary that is too small may fall out. A pessary that is too large may cause pain or discomfort. Your health care provider will do a physical exam to find the correct size and fit for your pessary. It may take several appointments to find the best fit for you. If you can be fit with the type of pessary that you can insert, remove, and clean yourself, your health care provider will teach you how to use your pessary at home. You may have checkups every few months. If you have the type of pessary that needs to be inserted and removed by your health care provider, you will have appointments every few months to have the pessary removed, cleaned, and replaced. What are the risks? When properly fitted and cared for, risks of using a vaginal pessary can be small. However, there can be problems that may include: Vaginal discharge. Vaginal bleeding. A bad smell coming from your vagina. Scraping of the skin inside your vagina. How to use your pessary Follow your health care provider's  instructions for using a pessary. These instructions may vary, depending on the type of pessary you have. To insert a pessary: Wash your hands with soap and water for at least 20 seconds. Squeeze or fold the pessary in half and lubricate the tip with a water-based lubricant. Insert the pessary into your vagina. It will unfold and provide support. To remove the pessary, gently tug it out of your vagina. You can remove the pessary every night or after several days. You can also remove it to have sex. How to care for your pessary If you have a pessary that you can remove: Clean your pessary with soap and water. Rinse well. Dry it completely before inserting it back into your vagina. Follow these instructions at home: Take over-the-counter and prescription medicines only as told by your health care provider. Your health care provider may prescribe an estrogen cream to moisten your vagina. Keep all follow-up visits. This is important. Contact a health care provider if: You feel any pain or discomfort when your pessary is in place. You continue to have stress incontinence. You have trouble keeping your pessary from falling out. You have an unusual vaginal discharge that is blood-tinged or smells bad. Summary A vaginal pessary is a removable device that is placed into your vagina to support pelvic organs that droop. This condition is called pelvic organ prolapse (POP). There are several types of pessaries. Some you can insert and remove on your own. Others must be inserted and  removed by your health care provider. The best type for you depends on the reason you are using a pessary and the severity of your condition. It is also important to find the right size. If you can use the type that you insert and remove on your own, your health care provider will teach you how to use it and schedule checkups every few months. If you have the type that needs to be inserted and removed by your health care  provider, you will have regular appointments to have your pessary removed, cleaned, and replaced. This information is not intended to replace advice given to you by your health care provider. Make sure you discuss any questions you have with your health care provider. Document Revised: 03/07/2020 Document Reviewed: 03/07/2020 Elsevier Patient Education  2024 [employer].

## 2023-05-25 NOTE — Telephone Encounter (Signed)
  Chief Complaint: rash- reoccurring- wants Dermatology referral renewed Symptoms: all over rash- moderate/severe itching- patient had Dermatology referral- but at the time the rash was not active. Patient states it is active again- she has autoimmune disease causing symptoms.  Frequency: 2 weeks Pertinent Negatives: Patient denies OTC treatment, new medications Disposition: [] ED /[] Urgent Care (no appt availability in office) / [] Appointment(In office/virtual)/ []  [place] [facility]/ [] Home Care/ [] Refused Recommended Disposition /[] [facility]/ [x]  Follow-up with PCP Additional Notes: Patient is requesting reactivation of referral- Dermatology- she was told to come back with active rash

## 2023-05-25 NOTE — Telephone Encounter (Signed)
She has a referral in place from August 2024 that is "authorized"

## 2023-05-25 NOTE — Telephone Encounter (Signed)
Summary: Reoccurring rash,sores,started two weeks ago.   Pt stated she needs a referral to a dermatologist. She stated her skin is breaking out again, from her chest to her ankles. It started two weeks ago. Sores itching, she goes through it every now and then going on for the past year. Stated feels sore, not painful.  Requesting Rx for itching.   Seeking clinical advice.         Reason for Disposition  SEVERE itching (i.e., interferes with sleep, normal activities or school)    Patient wants dermatology referral renewed  Answer Assessment - Initial Assessment Questions 1. APPEARANCE of RASH: "Describe the rash." (e.g., spots, blisters, raised areas, skin peeling, scaly)     Bumps- red, dried, sore 2. SIZE: "How big are the spots?" (e.g., tip of pen, eraser, coin; inches, centimeters)     Eraser sizr 3. LOCATION: "Where is the rash located?"     All over 4. COLOR: "What color is the rash?" (Note: It is difficult to assess rash color in people with darker-colored skin. When this situation occurs, simply ask the caller to describe what they see.)     red 5. ONSET: "When did the rash begin?"    2 weeks ago 6. FEVER: "Do you have a fever?" If Yes, ask: "What is your temperature, how was it measured, and when did it start?"     no 7. ITCHING: "Does the rash itch?" If Yes, ask: "How bad is the itch?" (Scale 1-10; or mild, moderate, severe)     Yes- moderate/severe 8. CAUSE: "What do you think is causing the rash?"     Was seen by dermatology 6 months ago- not active at that time 9. MEDICINE FACTORS: "Have you started any new medicines within the last 2 weeks?" (e.g., antibiotics)      Nothing new 10. OTHER SYMPTOMS: "Do you have any other symptoms?" (e.g., dizziness, headache, sore throat, joint pain)       no  Protocols used: Rash or Redness - [facility]

## 2023-06-09 ENCOUNTER — Encounter: Payer: Self-pay | Admitting: Obstetrics and Gynecology

## 2023-06-09 ENCOUNTER — Ambulatory Visit (INDEPENDENT_AMBULATORY_CARE_PROVIDER_SITE_OTHER): Payer: Medicaid Other | Admitting: Obstetrics and Gynecology

## 2023-06-09 VITALS — BP 138/100 | HR 85 | Resp 16 | Ht 65.0 in | Wt 145.6 lb

## 2023-06-09 DIAGNOSIS — N814 Uterovaginal prolapse, unspecified: Secondary | ICD-10-CM

## 2023-06-09 DIAGNOSIS — Z4689 Encounter for fitting and adjustment of other specified devices: Secondary | ICD-10-CM | POA: Diagnosis not present

## 2023-06-09 DIAGNOSIS — N3941 Urge incontinence: Secondary | ICD-10-CM

## 2023-06-09 DIAGNOSIS — R3911 Hesitancy of micturition: Secondary | ICD-10-CM

## 2023-06-09 MED ORDER — TRIMO-SAN 0.025 % VA GEL: 1.0000 | VAGINAL | 2 refills | Status: DC

## 2023-06-09 NOTE — Progress Notes (Signed)
GYNECOLOGY PROGRESS NOTE  Subjective:    Patient ID: Frances Davidson, female    DOB: 25-Dec-1960, 62 y.o.   MRN: [id number]  HPI  Patient is a 62 y.o. [id number] female who presents for 2 week pessary check. She has a cystocele with small rectocele and incomplete uterine prolapse. Currently has Size 2 ring with support in place. Today she reports that she has not had any issues, the pessary is working very well for her. Notes that she is no longer experiencing prolapse or leaking of urine. She reports no vaginal bleeding or discharge. She denies pelvic discomfort and difficulty urinating or moving her bowels.  The following portions of the patient's history were reviewed and updated as appropriate: allergies, current medications, past family history, past medical history, past social history, past surgical history, and problem list.  Review of Systems Pertinent items are noted in HPI.   Objective:   Blood pressure (!) 138/100, pulse 85, resp. rate 16, height 5\' 5"  (1.651 m), weight 145 lb 9.6 oz (66 kg), last menstrual period 04/21/2014. Body mass index is 24.23 kg/m. General appearance: alert, cooperative, and no distress Abdomen: soft, non-tender; bowel sounds normal; no masses,  no organomegaly Pelvic: ?The patient's Size 2 ring with support pessary was removed, cleaned and replaced without complications. Speculum examination revealed normal vaginal mucosa with no lesions or lacerations.    Assessment:   1. Pessary maintenance   2. 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 Urinary hesitancy   4. Urge incontinence of urine      Plan:   The patient should return in 3 months for a pessary check.  Can use Trimo-san gel for pessary maintenance.     Hildred Laser, MD [facility]

## 2023-06-17 ENCOUNTER — Ambulatory Visit: Payer: Medicaid Other | Admitting: Gastroenterology

## 2023-07-13 ENCOUNTER — Ambulatory Visit: Payer: Medicaid Other | Admitting: Dermatology

## 2023-08-03 ENCOUNTER — Encounter: Payer: Self-pay | Admitting: Dermatology

## 2023-08-03 ENCOUNTER — Ambulatory Visit: Payer: Medicaid Other | Admitting: Dermatology

## 2023-08-03 DIAGNOSIS — R21 Rash and other nonspecific skin eruption: Secondary | ICD-10-CM

## 2023-08-03 DIAGNOSIS — L810 Postinflammatory hyperpigmentation: Secondary | ICD-10-CM

## 2023-08-03 MED ORDER — DUPILUMAB 300 MG/2ML ~~LOC~~ SOAJ
300.0000 mg | SUBCUTANEOUS | Status: AC
Start: 2023-08-17 — End: 2023-08-17
  Administered 2023-08-17: 300 mg via SUBCUTANEOUS

## 2023-08-03 MED ORDER — PIMECROLIMUS 1 % EX CREA: TOPICAL_CREAM | Freq: Two times a day (BID) | CUTANEOUS | 1 refills | Status: DC

## 2023-08-03 MED ORDER — DUPILUMAB 300 MG/2ML ~~LOC~~ SOAJ
600.0000 mg | Freq: Once | SUBCUTANEOUS | Status: AC
Start: 2023-08-03 — End: 2023-08-03
  Administered 2023-08-03: 600 mg via SUBCUTANEOUS

## 2023-08-03 NOTE — Progress Notes (Signed)
   New Patient Visit   Subjective  Frances Davidson is a 62 y.o. female who presents for the following: Rash  All over body, present for about 2 years. Patient was using TMC 0.1% cream but said it does not work and would like something to take orally. Referral from rheumatology, she sees them for arthritis.   The following portions of the chart were reviewed this encounter and updated as appropriate: medications, allergies, medical history  Review of Systems:  No other skin or systemic complaints except as noted in HPI or Assessment and Plan.  Objective  Well appearing patient in no apparent distress; mood and affect are within normal limits.  A focused examination was performed of the following areas: Legs, arms, back, neck, face and chest  Relevant exam findings are noted in the Assessment and Plan.  arms, legs, trunk Scattered postinflammatory hyperpigmented macules, excoriations, and possible prurigo nodules on trunk and extremities (upper arms, forearms, lower legs, patient did not show thighs). Involves central back. Spares face. No lesions on scalp per patient      Assessment & Plan   Rash arms, legs, trunk  Chronic, flaring, not at patient goal. Undiagnosed new problem with uncertain prognosis  Reviewed: 04/20/23 rheumatology note "Frances Davidson was diagnosed with undifferentiated connective tissue disease in 2023 by Will DeFoor, PA-C. Diagnosis was supported by positive 1:160 speckled ANA with strongly positive ANA IgG, anti-RNP >240, positive SSA, SSB (strongly positive), anti-RNA Polymerase III, anti-centromere, PSPT"  Lesions are secondary in nature. No specific characteristics of a primary rash. Discussed that symptoms may be a nonspecific manifestation of undifferentiated connective tissue disease vs occult eczematous dermatitis vs other. Normal TSH (04/20/23), bilirubin, renal function, RBC count, and WBC count (05/06/23) rule out these as possible causes  of pruritus without rash. Elevated liver enzymes can be attributed to known fatty liver (per notes from 03/31/21 [facility]). No lesions suggestive of scabies. - offered biopsy but cautioned that findings may be non-specific given lack of primary lesion on exam. Patient prefers empiric treatment - Patient advised dark spots should lighten up and resolve on their own over time.  - Start pimecrolimus twice daily.  - Start Dupixent today. 600 mg today then 300 mg in 14 days then follow up in 28 days. Will send Rx if effective or do biopsy if not Samples injected today to bilateral upper arms. Patient tolerated well.  Lot # [id number]  Exp: 01/18/25  Dupilumab (Dupixent) is a treatment given by injection for adults and children with moderate-to-severe atopic dermatitis. Goal is control of skin condition, not cure. It is given as 2 injections at the first dose followed by 1 injection ever 2 weeks thereafter.  Young children are dosed monthly.  Potential side effects include allergic reaction, herpes infections, injection site reactions and conjunctivitis (inflammation of the eyes).  The use of Dupixent requires long term medication management, including periodic office visits.   Related Medications Dupilumab SOAJ 600 mg   Dupilumab SOAJ 300 mg   Postinflammatory hyperpigmentation      Return in about 2 weeks (around 08/17/2023) for Dupixent with nurse, 4 weeks with Dr. Katrinka Blazing.  Anise Salvo, RMA, am acting as scribe for Elie Goody, MD .   Documentation: I have reviewed the above documentation for accuracy and completeness, and I agree with the above.  Elie Goody, MD

## 2023-08-03 NOTE — Patient Instructions (Signed)
Start pimecrolimus twice daily to affected areas.   Dupilumab (Dupixent) is a treatment given by injection for adults and children with moderate-to-severe atopic dermatitis. Goal is control of skin condition, not cure. It is given as 2 injections at the first dose followed by 1 injection ever 2 weeks thereafter.  Young children are dosed monthly.  Potential side effects include allergic reaction, herpes infections, injection site reactions and conjunctivitis (inflammation of the eyes).  The use of Dupixent requires long term medication management, including periodic office visits.  Due to recent changes in healthcare laws, you may see results of your pathology and/or laboratory studies on MyChart before the doctors have had a chance to review them. We understand that in some cases there may be results that are confusing or concerning to you. Please understand that not all results are received at the same time and often the doctors may need to interpret multiple results in order to provide you with the best plan of care or course of treatment. Therefore, we ask that you please give [place] 2 business days to thoroughly review all your results before contacting the office for clarification. Should we see a critical lab result, you will be contacted sooner.   If You Need Anything After Your Visit  If you have any questions or concerns for your doctor, please call our main line at [phone] and press option 4 to reach your doctor's medical assistant. If no one answers, please leave a voicemail as directed and we will return your call as soon as possible. Messages left after 4 pm will be answered the following business day.   You may also send [place] a message via MyChart. We typically respond to MyChart messages within 1-2 business days.  For prescription refills, please ask your pharmacy to contact our office. Our fax number is [phone].  If you have an urgent issue when the clinic is closed that cannot  wait until the next business day, you can page your doctor at the number below.    Please note that while we do our best to be available for urgent issues outside of office hours, we are not available 24/7.   If you have an urgent issue and are unable to reach [place], you may choose to seek medical care at your doctor's office, retail clinic, urgent care center, or emergency room.  If you have a medical emergency, please immediately call 911 or go to the emergency department.  Pager Numbers  - Dr. Gwen Pounds: [phone]  - Dr. Roseanne Reno: [phone]  - Dr. Katrinka Blazing: [phone]   In the event of inclement weather, please call our main line at [phone] for an update on the status of any delays or closures.  Dermatology Medication Tips: Please keep the boxes that topical medications come in in order to help keep track of the instructions about where and how to use these. Pharmacies typically print the medication instructions only on the boxes and not directly on the medication tubes.   If your medication is too expensive, please contact our office at [phone] option 4 or send [place] a message through MyChart.   We are unable to tell what your co-pay for medications will be in advance as this is different depending on your insurance coverage. However, we may be able to find a substitute medication at lower cost or fill out paperwork to get insurance to cover a needed medication.   If a prior authorization is required to get your medication covered by your insurance  company, please allow [place] 1-2 business days to complete this process.  Drug prices often vary depending on where the prescription is filled and some pharmacies may offer cheaper prices.  The website www.goodrx.com contains coupons for medications through different pharmacies. The prices here do not account for what the cost may be with help from insurance (it may be cheaper with your insurance), but the website can give you the price  if you did not use any insurance.  - You can print the associated coupon and take it with your prescription to the pharmacy.  - You may also stop by our office during regular business hours and pick up a GoodRx coupon card.  - If you need your prescription sent electronically to a different pharmacy, notify our office through [facility] or by phone at [phone] option 4.     Si Usted Necesita Algo Despus de Su Visita  Tambin puede enviarnos un mensaje a travs de Clinical cytogeneticist. Por lo general respondemos a los mensajes de MyChart en el transcurso de 1 a 2 das hbiles.  Para renovar recetas, por favor pida a su farmacia que se ponga en contacto con nuestra oficina. Annie Sable de fax es [place] [phone].  Si tiene un asunto urgente cuando la clnica est cerrada y que no puede esperar hasta el siguiente da hbil, puede llamar/localizar a su doctor(a) al nmero que aparece a continuacin.   Por favor, tenga en cuenta que aunque hacemos todo lo posible para estar disponibles para asuntos urgentes fuera del horario de [place], no estamos disponibles las 24 horas del da, los 7 [address]  [address] de la [place].   Si tiene un problema urgente y no puede comunicarse con nosotros, puede optar por buscar atencin mdica  en el consultorio de su doctor(a), en una clnica privada, en un centro de atencin urgente o en una sala de emergencias.  Si tiene Engineer, drilling, por favor llame inmediatamente al 911 o vaya a la sala de emergencias.  Nmeros de bper  - Dr. Gwen Pounds: [phone]  - Dra. Roseanne Reno: [phone]  - Dr. Katrinka Blazing: [phone]   En caso de inclemencias del tiempo, por favor llame a Lacy Duverney principal al [phone] para una actualizacin sobre el [place] de cualquier retraso o cierre.  Consejos para la medicacin en dermatologa: Por favor, guarde las cajas en las que vienen los medicamentos de uso tpico para ayudarle a seguir las instrucciones sobre dnde y cmo  usarlos. Las farmacias generalmente imprimen las instrucciones del medicamento slo en las cajas y no directamente en los tubos del [place].   Si su medicamento es muy caro, por favor, pngase en contacto con Rolm [place]ala llamando al [phone] y presione la opcin 4 o envenos un mensaje a travs de Clinical cytogeneticist.   No podemos decirle cul ser su copago por los medicamentos por adelantado ya que esto es diferente dependiendo de la cobertura de su seguro. Sin embargo, es posible que podamos encontrar un medicamento sustituto a Audiological scientist un formulario para que el seguro cubra el medicamento que se considera necesario.   Si se requiere una autorizacin previa para que su compaa de seguros [place] su medicamento, por favor permtanos de 1 a 2 das hbiles para completar [address]  Los precios de los medicamentos varan con frecuencia dependiendo del Environmental consultant de dnde se surte la receta y alguna farmacias pueden ofrecer precios ms baratos.  El sitio web www.goodrx.com tiene cupones para medicamentos de Health and safety inspector. Los precios aqu no tienen en  cuenta lo que podra costar con la ayuda del seguro (puede ser ms barato con su seguro), pero el sitio web puede darle el precio si no Visual merchandiser.  - Puede imprimir el cupn correspondiente y llevarlo con su receta a la farmacia.  - Tambin puede pasar por nuestra oficina durante el horario de atencin regular y Education officer, museum una tarjeta de cupones de GoodRx.  - Si necesita que su receta se enve electrnicamente a una farmacia diferente, informe a nuestra oficina a travs de MyChart de [place] o por telfono llamando al [phone] y presione la opcin 4.

## 2023-08-09 ENCOUNTER — Encounter: Payer: Self-pay | Admitting: Nurse Practitioner

## 2023-08-09 ENCOUNTER — Ambulatory Visit: Payer: Medicaid Other | Admitting: Nurse Practitioner

## 2023-08-09 VITALS — BP 111/78 | HR 83 | Temp 98.4°F | Ht 65.0 in | Wt 145.4 lb

## 2023-08-09 DIAGNOSIS — H9311 Tinnitus, right ear: Secondary | ICD-10-CM

## 2023-08-09 DIAGNOSIS — E782 Mixed hyperlipidemia: Secondary | ICD-10-CM | POA: Diagnosis not present

## 2023-08-09 DIAGNOSIS — M359 Systemic involvement of connective tissue, unspecified: Secondary | ICD-10-CM | POA: Diagnosis not present

## 2023-08-09 DIAGNOSIS — Z23 Encounter for immunization: Secondary | ICD-10-CM

## 2023-08-09 MED ORDER — ATORVASTATIN CALCIUM 20 MG PO TABS: 20.0000 mg | ORAL_TABLET | Freq: Every day | ORAL | 1 refills | Status: DC

## 2023-08-09 MED ORDER — CETIRIZINE HCL 10 MG PO TABS: 10.0000 mg | ORAL_TABLET | Freq: Every day | ORAL | 11 refills | Status: DC

## 2023-08-09 MED ORDER — OMEPRAZOLE MAGNESIUM 20 MG PO TBEC: 20.0000 mg | DELAYED_RELEASE_TABLET | Freq: Every day | ORAL | 1 refills | Status: AC

## 2023-08-09 NOTE — Progress Notes (Signed)
BP 111/78 (BP Location: Left Arm, Patient Position: Sitting, Cuff Size: Normal)   Pulse 83   Temp 98.4 F (36.9 C) (Oral)   Ht 5\' 5"  (1.651 m)   Wt 145 lb 6.4 oz (66 kg)   LMP 04/21/2014 (Approximate)   SpO2 97%   BMI 24.20 kg/m    Subjective:    Patient ID: Frances Davidson, female    DOB: 1961/01/10, 62 y.o.   MRN: [id number]  HPI: Frances Davidson is a 62 y.o. female  Chief Complaint  Patient presents with   3 month follow up    Has been hoarse for 2 weeks, has cleared for two days and came back. No pain, feels as if something is stuck in throat    Hyperlipidemia   congested ear    Right ear    Arthritis    Right index finger and joints    Patient presents to clinic to discuss her connective tissue disorder.   Patient states she is still taking the hydroxychloroquine.  She is also on Dupixent via dermatology.  She is no longer itching and breaking out in hives.     Patient states she has been having ringing in her right ear.  States it has been going on for about 2 weeks.      Relevant past medical, surgical, family and social history reviewed and updated as indicated. Interim medical history since our last visit reviewed. Allergies and medications reviewed and updated.  Review of Systems  HENT:  Positive for tinnitus.   Musculoskeletal:        Pain and burning in feet  Skin:        Itching and hives    Per HPI unless specifically indicated above     Objective:    BP 111/78 (BP Location: Left Arm, Patient Position: Sitting, Cuff Size: Normal)   Pulse 83   Temp 98.4 F (36.9 C) (Oral)   Ht 5\' 5"  (1.651 m)   Wt 145 lb 6.4 oz (66 kg)   LMP 04/21/2014 (Approximate)   SpO2 97%   BMI 24.20 kg/m   Wt Readings from Last 3 Encounters:  08/09/23 145 lb 6.4 oz (66 kg)  06/09/23 145 lb 9.6 oz (66 kg)  05/25/23 141 lb 12.8 oz (64.3 kg)    Physical Exam Vitals and nursing note reviewed.  Constitutional:      General: She is not in acute distress.     Appearance: Normal appearance. She is normal weight. She is not ill-appearing, toxic-appearing or diaphoretic.  HENT:     Head: Normocephalic.     Right Ear: External ear normal. A middle ear effusion is present.     Left Ear: External ear normal.  No middle ear effusion.     Nose: Nose normal.     Mouth/Throat:     Mouth: Mucous membranes are moist.     Pharynx: Oropharynx is clear.  Eyes:     General:        Right eye: No discharge.        Left eye: No discharge.     Extraocular Movements: Extraocular movements intact.     Conjunctiva/sclera: Conjunctivae normal.     Pupils: Pupils are equal, round, and reactive to light.  Cardiovascular:     Rate and Rhythm: Normal rate and regular rhythm.     Heart sounds: No murmur heard. Pulmonary:     Effort: Pulmonary effort is normal. No respiratory distress.     Breath sounds:  Normal breath sounds. No wheezing or rales.  Musculoskeletal:     Cervical back: Normal range of motion and neck supple.  Skin:    General: Skin is warm and dry.     Capillary Refill: Capillary refill takes less than 2 seconds.  Neurological:     General: No focal deficit present.     Mental Status: She is alert and oriented to person, place, and time. Mental status is at baseline.  Psychiatric:        Mood and Affect: Mood normal.        Behavior: Behavior normal.        Thought Content: Thought content normal.        Judgment: Judgment normal.     Results for orders placed or performed in visit on 05/06/23  HgB A1c  Result Value Ref Range   Hgb A1c MFr Bld 4.9 4.8 - 5.6 %   Est. average glucose Bld gHb Est-mCnc 94 mg/dL  Comp Met (CMET)  Result Value Ref Range   Glucose 77 70 - 99 mg/dL   BUN 5 (L) 8 - 27 mg/dL   Creatinine, Ser 1.61 0.57 - 1.00 mg/dL   eGFR 80 >09 UE/AVW/0.98   BUN/Creatinine Ratio 6 (L) 12 - 28   Sodium 138 134 - 144 mmol/L   Potassium 4.5 3.5 - 5.2 mmol/L   Chloride 103 96 - 106 mmol/L   CO2 21 20 - 29 mmol/L   Calcium 9.2  8.7 - 10.3 mg/dL   Total Protein 6.4 6.0 - 8.5 g/dL   Albumin 4.1 3.9 - 4.9 g/dL   Globulin, Total 2.3 1.5 - 4.5 g/dL   Bilirubin Total 0.4 0.0 - 1.2 mg/dL   Alkaline Phosphatase 93 44 - 121 IU/L   AST 73 (H) 0 - 40 IU/L   ALT 65 (H) 0 - 32 IU/L  CBC w/Diff  Result Value Ref Range   WBC 5.1 3.4 - 10.8 x10E3/uL   RBC 3.77 3.77 - 5.28 x10E6/uL   Hemoglobin 10.9 (L) 11.1 - 15.9 g/dL   Hematocrit 11.9 (L) 14.7 - 46.6 %   MCV 90 79 - 97 fL   MCH 28.9 26.6 - 33.0 pg   MCHC 32.2 31.5 - 35.7 g/dL   RDW 82.9 56.2 - 13.0 %   Platelets 241 150 - 450 x10E3/uL   Neutrophils 53 Not Estab. %   Lymphs 26 Not Estab. %   Monocytes 8 Not Estab. %   Eos 11 Not Estab. %   Basos 1 Not Estab. %   Neutrophils Absolute 2.7 1.4 - 7.0 x10E3/uL   Lymphocytes Absolute 1.3 0.7 - 3.1 x10E3/uL   Monocytes Absolute 0.4 0.1 - 0.9 x10E3/uL   EOS (ABSOLUTE) 0.6 (H) 0.0 - 0.4 x10E3/uL   Basophils Absolute 0.0 0.0 - 0.2 x10E3/uL   Immature Granulocytes 1 Not Estab. 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  Improved.  Followed by Rheumatology and Dermatology.  Now on Dupixent which is helping with hives and itching.  Only taken 1 shot.  Continue to follow up with specialists.  CBC checked today.       Relevant Orders   Comp Met (CMET)   CBC w/Diff   Hyperlipidemia    Chronic.  Controlled on atorvastatin.  Labs ordered at visit today.  Will make recommendations based on lab results.        Relevant Medications   atorvastatin (LIPITOR) 20 MG tablet   Other Relevant Orders   Comp Met (CMET)    Lipid Profile   Other Visit Diagnoses     Tinnitus of right ear       Recommend using zyrtec daily.  If not improved, can refer to ENT.        Follow up plan: Return in about 6 months (around 02/06/2024) for Physical and Fasting labs.   A total of 30 minutes were spent on this encounter today.  When total time is documented, this includes both the face-to-face and non-face-to-face time personally spent before, during and after the visit on the date of the encounter symptoms, ER visit, labs, plan of care and follow up.

## 2023-08-09 NOTE — Assessment & Plan Note (Signed)
Chronic.  Improved.  Followed by Rheumatology and Dermatology.  Now on Dupixent which is helping with hives and itching.  Only taken 1 shot.  Continue to follow up with specialists.  CBC checked today.

## 2023-08-09 NOTE — Assessment & Plan Note (Signed)
Chronic.  Controlled on atorvastatin.  Labs ordered at visit today.  Will make recommendations based on lab results.

## 2023-08-10 LAB — COMPREHENSIVE METABOLIC PANEL
ALT: 25 [IU]/L (ref 0–32)
AST: 42 [IU]/L — ABNORMAL HIGH (ref 0–40)
Albumin: 4.2 g/dL (ref 3.9–4.9)
Alkaline Phosphatase: 95 [IU]/L (ref 44–121)
BUN/Creatinine Ratio: 15 (ref 12–28)
BUN: 13 mg/dL (ref 8–27)
Bilirubin Total: 0.3 mg/dL (ref 0.0–1.2)
CO2: 19 mmol/L — ABNORMAL LOW (ref 20–29)
Calcium: 9 mg/dL (ref 8.7–10.3)
Chloride: 100 mmol/L (ref 96–106)
Creatinine, Ser: 0.84 mg/dL (ref 0.57–1.00)
Globulin, Total: 2.4 g/dL (ref 1.5–4.5)
Glucose: 79 mg/dL (ref 70–99)
Potassium: 4.1 mmol/L (ref 3.5–5.2)
Sodium: 137 mmol/L (ref 134–144)
Total Protein: 6.6 g/dL (ref 6.0–8.5)
eGFR: 78 mL/min/{1.73_m2} (ref >59)

## 2023-08-10 LAB — CBC WITH DIFFERENTIAL/PLATELET
Basophils Absolute: 0 10*3/uL (ref 0.0–0.2)
Basos: 0 %
EOS (ABSOLUTE): 0 10*3/uL (ref 0.0–0.4)
Eos: 0 %
Hematocrit: 35.5 % (ref 34.0–46.6)
Hemoglobin: 11.7 g/dL (ref 11.1–15.9)
Immature Grans (Abs): 0 10*3/uL (ref 0.0–0.1)
Immature Granulocytes: 0 %
Lymphocytes Absolute: 1.1 10*3/uL (ref 0.7–3.1)
Lymphs: 16 %
MCH: 31.6 pg (ref 26.6–33.0)
MCHC: 33 g/dL (ref 31.5–35.7)
MCV: 96 fL (ref 79–97)
Monocytes Absolute: 0.2 10*3/uL (ref 0.1–0.9)
Monocytes: 3 %
Neutrophils Absolute: 5.7 10*3/uL (ref 1.4–7.0)
Neutrophils: 81 %
Platelets: 257 10*3/uL (ref 150–450)
RBC: 3.7 x10E6/uL — ABNORMAL LOW (ref 3.77–5.28)
RDW: 13.8 % (ref 11.7–15.4)
WBC: 7.1 10*3/uL (ref 3.4–10.8)

## 2023-08-10 LAB — LIPID PANEL
Chol/HDL Ratio: 1.8 ratio (ref 0.0–4.4)
Cholesterol, Total: 255 mg/dL — ABNORMAL HIGH (ref 100–199)
HDL: 143 mg/dL (ref >39)
LDL Chol Calc (NIH): 87 mg/dL (ref 0–99)
Triglycerides: 156 mg/dL — ABNORMAL HIGH (ref 0–149)
VLDL Cholesterol Cal: 25 mg/dL (ref 5–40)

## 2023-08-12 ENCOUNTER — Other Ambulatory Visit: Payer: Self-pay | Admitting: Nurse Practitioner

## 2023-08-12 DIAGNOSIS — Z1231 Encounter for screening mammogram for malignant neoplasm of breast: Secondary | ICD-10-CM

## 2023-08-17 ENCOUNTER — Ambulatory Visit: Payer: Medicaid Other

## 2023-08-17 DIAGNOSIS — R21 Rash and other nonspecific skin eruption: Secondary | ICD-10-CM

## 2023-08-17 NOTE — Progress Notes (Signed)
Patient here today for two week injection of Dupixent 300mg /43mL.   Dupixent 300mg /34mL injected into left upper arm. Patient tolerated injection well.  LOT: [id number] EXP: 04/2025  Dorathy Daft, RMA

## 2023-09-03 ENCOUNTER — Ambulatory Visit
Admission: RE | Admit: 2023-09-03 | Discharge: 2023-09-03 | Disposition: A | Discharge status: Home / Self Care | Payer: Medicaid Other | Source: Ambulatory Visit | Attending: Nurse Practitioner | Admitting: Nurse Practitioner

## 2023-09-03 DIAGNOSIS — Z79899 Other long term (current) drug therapy: Secondary | ICD-10-CM | POA: Diagnosis not present

## 2023-09-03 DIAGNOSIS — M359 Systemic involvement of connective tissue, unspecified: Secondary | ICD-10-CM | POA: Diagnosis not present

## 2023-09-03 DIAGNOSIS — Z1231 Encounter for screening mammogram for malignant neoplasm of breast: Secondary | ICD-10-CM | POA: Insufficient documentation

## 2023-09-03 DIAGNOSIS — F1090 Alcohol use, unspecified, uncomplicated: Secondary | ICD-10-CM | POA: Diagnosis not present

## 2023-09-06 ENCOUNTER — Ambulatory Visit: Payer: Medicaid Other | Admitting: Dermatology

## 2023-09-06 ENCOUNTER — Encounter: Payer: Self-pay | Admitting: Dermatology

## 2023-09-06 DIAGNOSIS — D485 Neoplasm of uncertain behavior of skin: Secondary | ICD-10-CM

## 2023-09-06 DIAGNOSIS — L280 Lichen simplex chronicus: Secondary | ICD-10-CM

## 2023-09-06 DIAGNOSIS — D489 Neoplasm of uncertain behavior, unspecified: Secondary | ICD-10-CM

## 2023-09-06 DIAGNOSIS — D492 Neoplasm of unspecified behavior of bone, soft tissue, and skin: Secondary | ICD-10-CM | POA: Diagnosis not present

## 2023-09-06 DIAGNOSIS — L821 Other seborrheic keratosis: Secondary | ICD-10-CM | POA: Diagnosis not present

## 2023-09-06 DIAGNOSIS — L819 Disorder of pigmentation, unspecified: Secondary | ICD-10-CM

## 2023-09-06 DIAGNOSIS — D2271 Melanocytic nevi of right lower limb, including hip: Secondary | ICD-10-CM | POA: Diagnosis not present

## 2023-09-06 NOTE — Progress Notes (Signed)
   Follow-Up Visit   Subjective  Frances Davidson is a 62 y.o. female who presents for the following: 4 week rash follow up. Patient was started on Dupixent 4 weeks ago. Using pimecrolimus which seems to help with itch but patient said she is no better.   The patient has spots, moles and lesions to be evaluated, some may be new or changing and the patient may have concern these could be cancer.   The following portions of the chart were reviewed this encounter and updated as appropriate: medications, allergies, medical history  Review of Systems:  No other skin or systemic complaints except as noted in HPI or Assessment and Plan.  Objective  Well appearing patient in no apparent distress; mood and affect are within normal limits.    A focused examination was performed of the following areas: Arms, legs, trunk  Relevant exam findings are noted in the Assessment and Plan.  right lower medial distal leg 1 cm pigmented macule with irregular borders, central darker area with blue veil  right upper arm indurated hyperpigmented papule with hemorrhagic crusting   Assessment & Plan    SEBORRHEIC KERATOSIS - Stuck-on, waxy, tan-brown plaque L dorsal foot - Benign-appearing - Discussed benign etiology and prognosis. - Observe - Call for any changes   NEOPLASM OF UNCERTAIN BEHAVIOR OF SKIN right lower medial distal leg Skin / nail biopsy Type of biopsy: tangential   Informed consent: discussed and consent obtained   Timeout: patient name, date of birth, surgical site, and procedure verified   Procedure prep:  Patient was prepped and draped in usual sterile fashion Prep type:  Isopropyl alcohol Anesthesia: the lesion was anesthetized in a standard fashion   Anesthetic:  1% lidocaine w/ epinephrine 1-100,000 buffered w/ 8.4% NaHCO3 Instrument used: DermaBlade   Hemostasis achieved with: pressure and aluminum chloride   Outcome: patient tolerated procedure well   Post-procedure  details: sterile dressing applied and wound care instructions given   Dressing type: bandage and petrolatum   POST-INFLAMMATORY PIGMENTARY CHANGES arms, legs Patient and patient's husband advised these darker spots can take time to fade, caused by inflammation.   Start pimecrolimus twice daily to aa.  NEOPLASM OF UNCERTAIN BEHAVIOR right upper arm Skin / nail biopsy Type of biopsy: punch   Informed consent: discussed and consent obtained   Timeout: patient name, date of birth, surgical site, and procedure verified   Procedure prep:  Patient was prepped and draped in usual sterile fashion Prep type:  Isopropyl alcohol Anesthesia: the lesion was anesthetized in a standard fashion   Anesthetic:  1% lidocaine w/ epinephrine 1-100,000 buffered w/ 8.4% NaHCO3 Punch size:  4 mm Suture size:  4-0 Suture type: nylon   Suture removal (days):  7 Hemostasis achieved with: suture, pressure and aluminum chloride   Outcome: patient tolerated procedure well   Post-procedure details: sterile dressing applied and wound care instructions given      Return in about 1 week (around 09/13/2023) for Suture Removal.  Anise Salvo, RMA, am acting as scribe for Elie Goody, MD .   Documentation: I have reviewed the above documentation for accuracy and completeness, and I agree with the above.  Elie Goody, MD

## 2023-09-06 NOTE — Patient Instructions (Signed)

## 2023-09-09 LAB — SURGICAL PATHOLOGY

## 2023-09-11 IMAGING — MG MM DIGITAL SCREENING BILAT W/ TOMO AND CAD
6 of 10 series · 6 of 30 positions shown · non-contrast
Comparison: Previous exam(s).

CLINICAL DATA: Screening.

EXAM:
DIGITAL SCREENING BILATERAL MAMMOGRAM WITH TOMOSYNTHESIS AND CAD
TECHNIQUE: Bilateral screening digital craniocaudal and mediolateral oblique
mammograms were obtained. Bilateral screening digital breast
tomosynthesis was performed. The images were evaluated with
computer-aided detection.

[R CC synth-2D (1 of 2)]
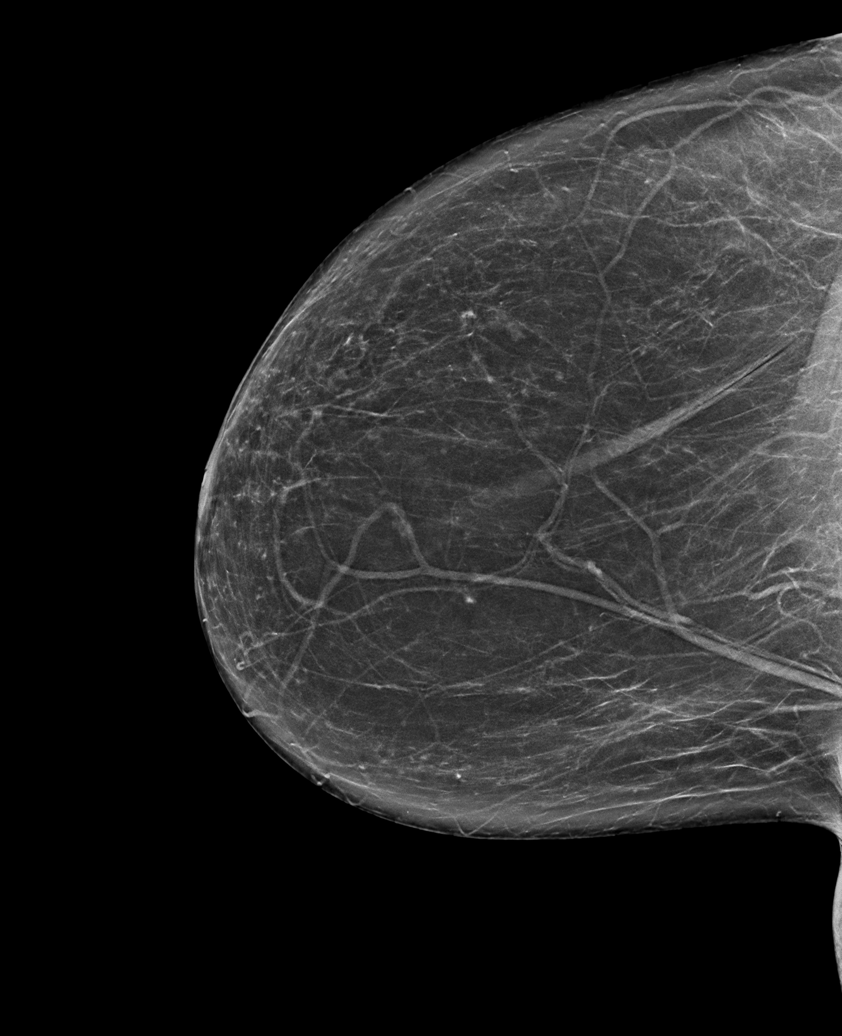

[R MLO synth-2D]
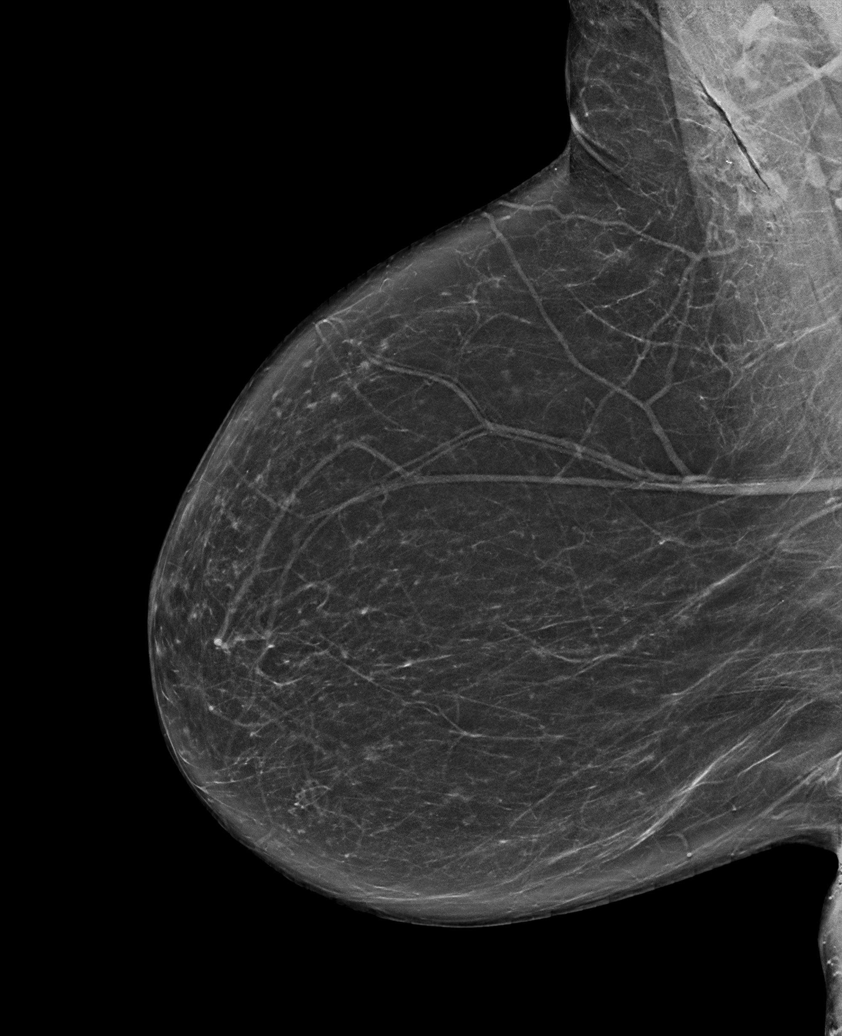

[L MLO synth-2D]
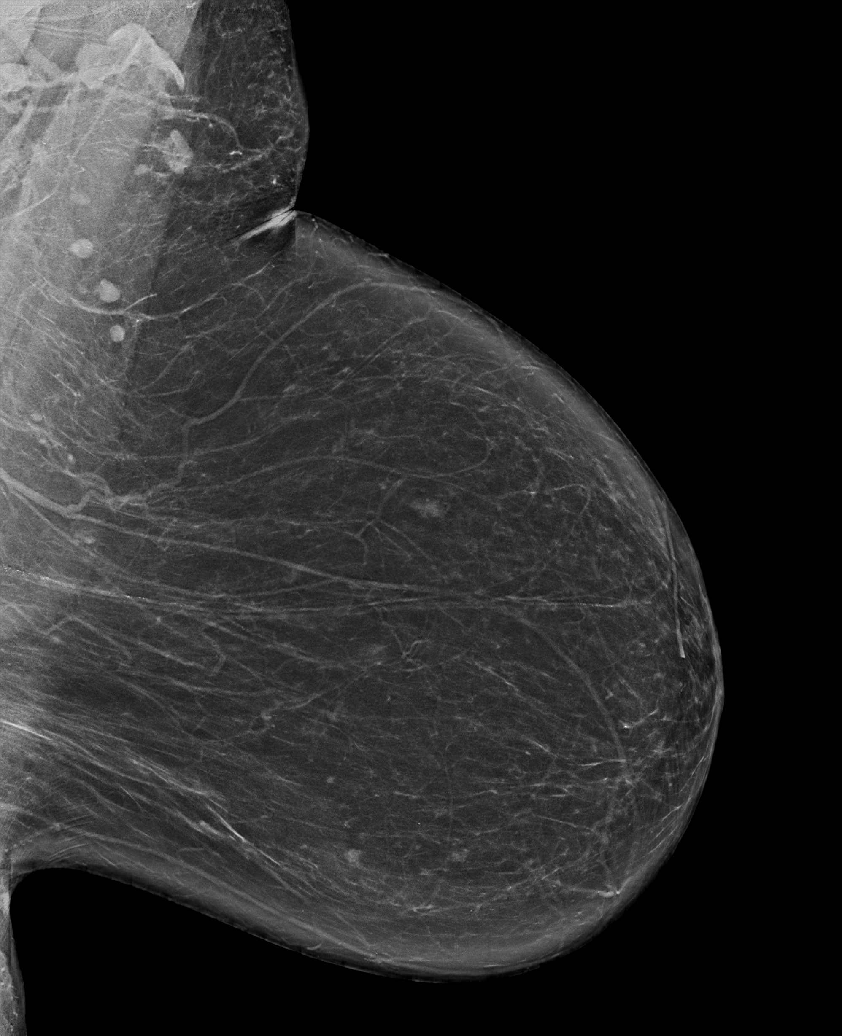

[L CC synth-2D]
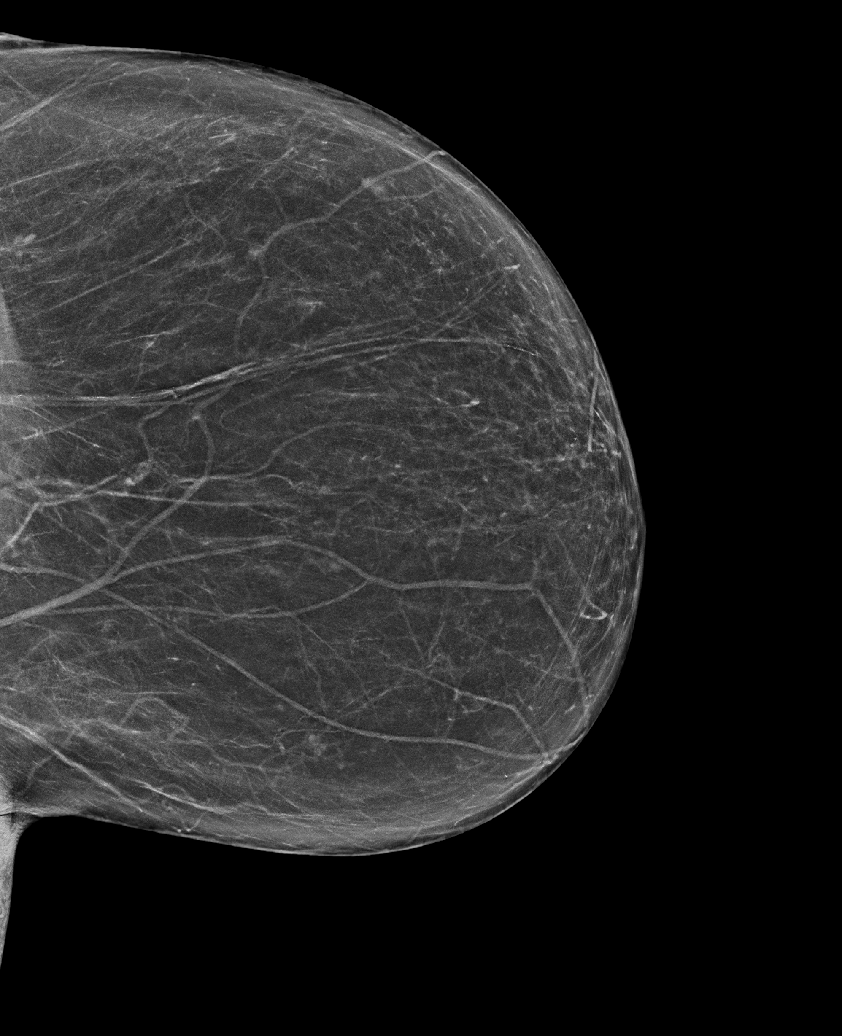

[R CC synth-2D (2 of 2)]
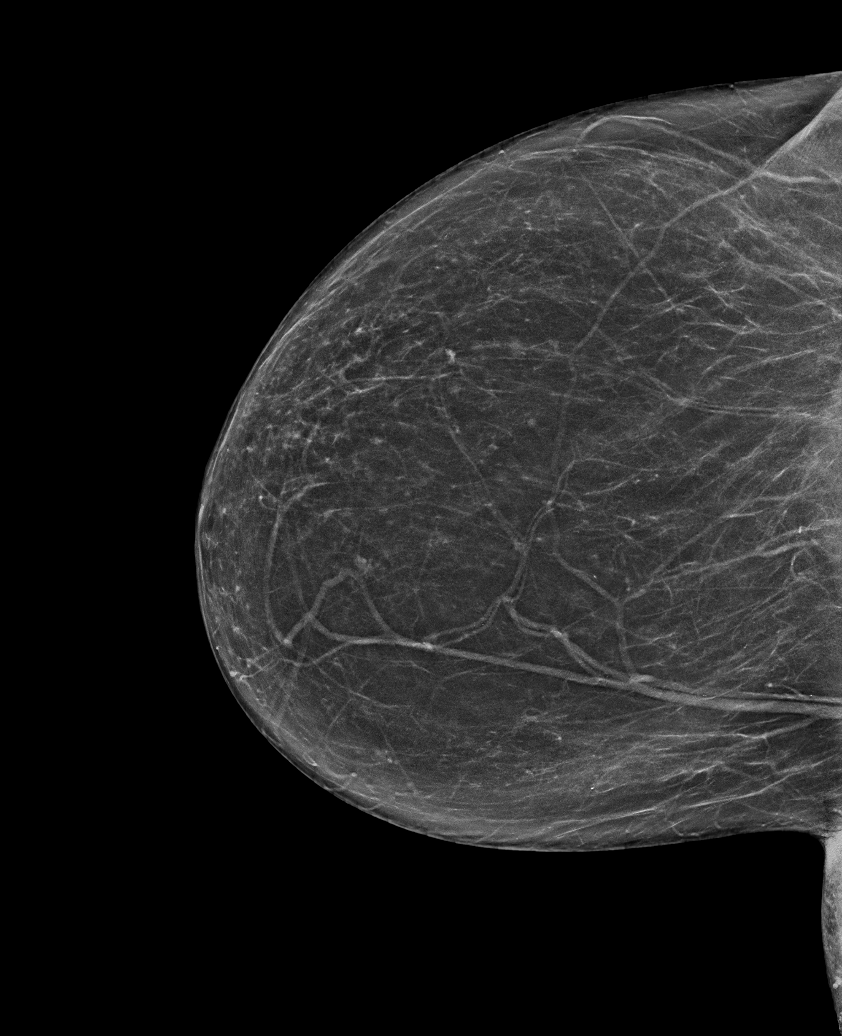

[L MLO tomo · tomo slice 39/77.0]
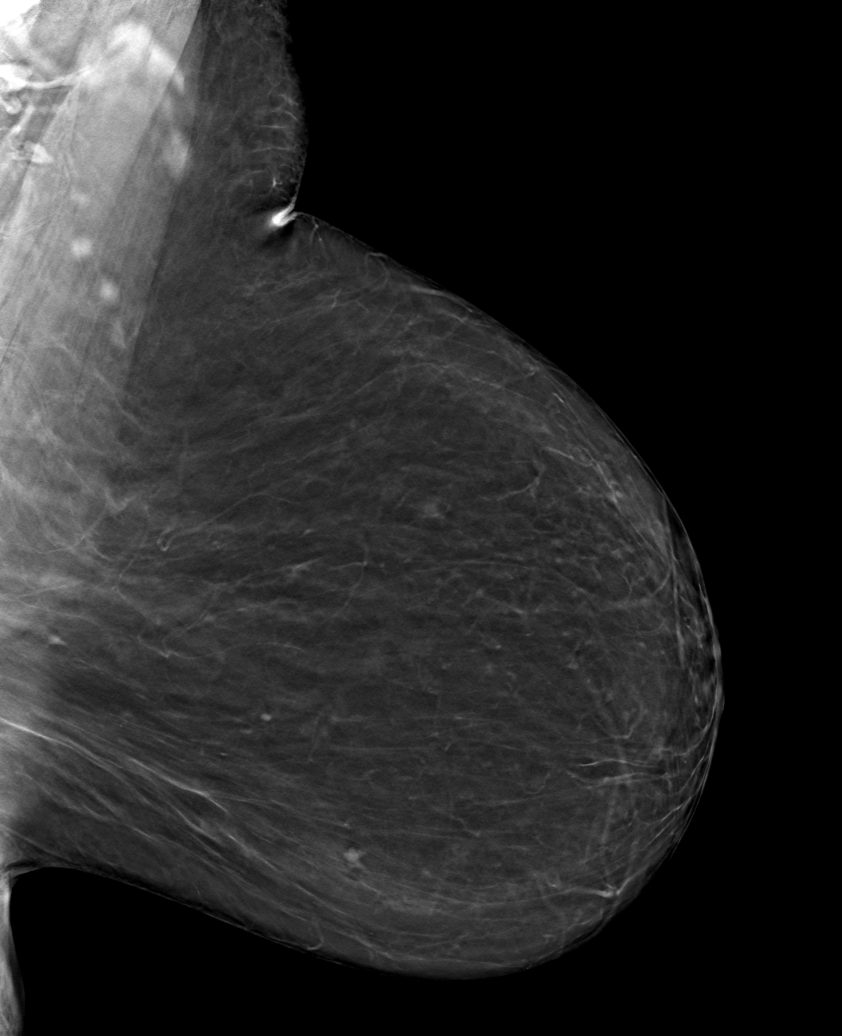

[6 of 30 positions shown; findings below may reference images not displayed]

ACR Breast Density Category b: There are scattered areas of
fibroglandular density.
FINDINGS: There are no findings suspicious for malignancy.
IMPRESSION: No mammographic evidence of malignancy. A result letter of this
screening mammogram will be mailed directly to the patient.

RECOMMENDATION:
Screening mammogram in one year. (Code:51-O-LD2)

BI-RADS CATEGORY  1: Negative.

## 2023-09-12 ENCOUNTER — Encounter: Payer: Self-pay | Admitting: Dermatology

## 2023-09-13 ENCOUNTER — Ambulatory Visit: Payer: Medicaid Other | Admitting: Dermatology

## 2023-09-13 NOTE — Patient Instructions (Signed)

## 2023-09-13 NOTE — Progress Notes (Signed)
   Follow-Up Visit   Subjective  Frances Davidson is a 62 y.o. 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  Objective  Well appearing patient in no apparent distress; mood and affect are within normal limits.  Areas Examined: Right upper arm Relevant physical exam findings are noted in the Assessment and Plan.    Assessment & Plan    Encounter for Removal of Sutures - Incision site is clean, dry and intact. - Wound cleansed, sutures removed, wound cleansed and steri strips applied.  - Discussed pathology results showing LICHEN SIMPLEX CHRONICUS at right upper arm - Patient advised to keep steri-strips dry until they fall off. - Scars remodel for a full year. - Once steri-strips fall off, patient can apply over-the-counter silicone scar cream once to twice a day to help with scar remodeling if desired. - Patient advised to call with any concerns or if they notice any new or changing lesions.  Return if symptoms worsen or fail to improve.  Marilynn Rail

## 2023-09-14 ENCOUNTER — Telehealth: Payer: Self-pay | Admitting: Gastroenterology

## 2023-09-14 NOTE — Telephone Encounter (Signed)
I tried to call and schedule an appointment. The patient didn't answer so I left a voicemail to call back to schedule appointment.

## 2023-09-19 ENCOUNTER — Telehealth: Payer: Self-pay | Admitting: Dermatology

## 2023-09-19 NOTE — Telephone Encounter (Signed)
09/06/23 biopsy shows lichen simplex chronicus which is likely due to chronic eczema. Patient has failed triamcinolone, prednisone, pimecrolimus, and partially improved on Dupixent. Thus, we will send Dupixent prescriptions for chronic eczema.

## 2023-09-20 ENCOUNTER — Telehealth: Payer: Self-pay

## 2023-09-20 MED ORDER — DUPIXENT 300 MG/2ML ~~LOC~~ SOAJ: 300.0000 mg | SUBCUTANEOUS | 6 refills | Status: DC

## 2023-09-20 NOTE — Telephone Encounter (Signed)
Called pt and left her a message to call back to the office so we can get her scheduled for a 3 month follow up.  Also advised we have sent Dupixent to [facility].

## 2023-09-20 NOTE — Telephone Encounter (Signed)
-----   Message from [facility] sent at 09/12/2023 10:07 PM EST -----  1. Skin, right upper arm :       LICHEN SIMPLEX CHRONICUS        2. Skin, right lower medial distal leg :       MELANOCYTIC NEVUS, INTRADERMAL TYPE, BASE INVOLVED   Sent mychart

## 2023-10-12 NOTE — Progress Notes (Unsigned)
    GYNECOLOGY PROGRESS NOTE  Subjective:    Patient ID: Frances Davidson, female    DOB: 15-Aug-1961, 63 y.o.   MRN: [id number]  HPI  Patient is a 63 y.o. G11P3003 female who presents for pessary check. She has a cystocele with small rectocele and incomplete uterine prolapse. Currently has Size 2 ring with support, but not in place as it was expelled about 1 month ago due to straining from constipation. Has an appointment with Laurette Schimke due to issues with chronic constipation from IBS. Also desires to think about surgical option.   The following portions of the patient's history were reviewed and updated as appropriate: allergies, current medications, past family history, past medical history, past social history, past surgical history, and problem list.  Review of Systems Pertinent items are noted in HPI.   Objective:   Blood pressure 111/86, pulse 85, height 5' 5.5" (1.664 m), weight 142 lb (64.4 kg), last menstrual period 04/21/2014.] Body mass index is 23.27 kg/m. General appearance: alert, cooperative, and no distress Abdomen: soft, non-tender; bowel sounds normal; no masses,  no organomegaly Pelvic: ?Speculum examination revealed normal vaginal mucosa with no lesions or lacerations. Rectocele Grade 1, cystocele Grade 2. Mild Grade 1-2 descensus.  The patient's Size 2 ring with support pessary was  cleaned and replaced without complications.  Extremities: extremities normal, atraumatic, no cyanosis or edema Neurologic: Grossly normal   Assessment:   1. Pessary maintenance   2. 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 Irritable bowel syndrome with both constipation and diarrhea      Plan:   - The patient should return in 3 months for a pessary check.  Can use Trimo-san gel for pessary maintenance.  - Discussed option of referral to UroGYN for discussion of surgical options (uterine suspension vs hysterectomy with A/P repair if needed).  Patient ok for referral.  -  Has appt for Gastro for management of IBS and constipation issues.     Hildred Laser, MD [facility]

## 2023-10-13 ENCOUNTER — Encounter: Payer: Self-pay | Admitting: Obstetrics and Gynecology

## 2023-10-13 ENCOUNTER — Ambulatory Visit (INDEPENDENT_AMBULATORY_CARE_PROVIDER_SITE_OTHER): Payer: Medicaid Other | Admitting: Obstetrics and Gynecology

## 2023-10-13 VITALS — BP 111/86 | HR 85 | Ht 65.5 in | Wt 142.0 lb

## 2023-10-13 DIAGNOSIS — N814 Uterovaginal prolapse, unspecified: Secondary | ICD-10-CM | POA: Diagnosis not present

## 2023-10-13 DIAGNOSIS — Z4689 Encounter for fitting and adjustment of other specified devices: Secondary | ICD-10-CM | POA: Diagnosis not present

## 2023-10-13 DIAGNOSIS — K582 Mixed irritable bowel syndrome: Secondary | ICD-10-CM

## 2023-10-26 ENCOUNTER — Ambulatory Visit (INDEPENDENT_AMBULATORY_CARE_PROVIDER_SITE_OTHER): Payer: Medicare Other | Admitting: Gastroenterology

## 2023-10-26 VITALS — BP 86/61 | HR 114 | Temp 97.8°F | Wt 139.5 lb

## 2023-10-26 DIAGNOSIS — K580 Irritable bowel syndrome with diarrhea: Secondary | ICD-10-CM

## 2023-10-26 MED ORDER — ZENPEP 40000-126000 UNITS PO CPEP: ORAL_CAPSULE | ORAL | Status: DC

## 2023-10-26 NOTE — Addendum Note (Signed)
Addended by: Roena Malady on: 10/26/2023 02:53 PM   Modules accepted: Orders

## 2023-10-26 NOTE — Progress Notes (Signed)
 Ruel Kung MD, MRCP(U.K) [address]  [address]  [place], [place] [address]  Main: [phone]  Fax: [phone]   Primary Care Physician: Melvin Pao, NP  Primary Gastroenterologist:  Dr. Ruel Kung   Chief Complaint  Patient presents with   Abdominal Pain    HPI: Frances Davidson is a 63 y.o. female Summary of history :   This is a patient was previously seen Dr. Janalyn in October 2022 for 5-year history of loose stools 2-4 loose bowel movements a day no blood in the stool associated with bloating fatigue.   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  Was also noted to have elevated transaminases.   Lab work in October 2022 showed mild elevation of AST at 66 b, not immune to hepatitis A and B.  Autoimmune work-up was negative as well as viral hepatitis work-up.  Right upper quadrant ultrasound in July 2022 showed hepatic steatosis.  She did not follow-up after.   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 pylori no evidence of celiac disease either   Interval history 04/23/2022-10/26/2023  12/2022 seen at the emergency room for abdominal pain resolving after multiple bowel movements.  CT scan performed as below.  Given Augmentin  and steroids discharged  12/2022: Mild diffuse colonic wall thickening involving the distal transverse and descending colon, suggesting nonspecific infectious or inflammatory colitis. 2. Descending and sigmoid diverticulosis, which does not appear to be the nidus of inflammatory findings described above. 3. Hepatic steatosis.   She says that she has had long-term diarrhea which is worse recently up to 5 times a day nonbloody  usually right after she drinks water with lower abdominal discomfort relieved after bowel movement lost 10 pounds of weight.  Denies any NSAIDs.  Tell her stools are oily which floats on top.  Current Outpatient Medications  Medication Sig Dispense Refill   atorvastatin  (LIPITOR) 20 MG tablet Take 1 tablet (20 mg total) by mouth daily. 90 tablet 1   Calcium  Carbonate-Vitamin D (CALCIUM -VITAMIN D) 600-3.125 MG-MCG TABS Take 1 capsule by mouth daily.     cetirizine  (ZYRTEC ) 10 MG tablet Take 1 tablet (10 mg total) by mouth daily. 30 tablet 11   conjugated estrogens  (PREMARIN ) vaginal cream Place 0.5 grams vaginally 3 times per week using applicator. 42.5 g 6   Dupilumab  (DUPIXENT ) 300 MG/2ML SOAJ Inject 300 mg into the skin every 14 (fourteen) days. Starting at day 15 for maintenance. 4 mL 6   gabapentin  (NEURONTIN ) 100 MG capsule Take by mouth.     hydroxychloroquine (PLAQUENIL) 200 MG tablet TAKE 1 TABLET BY MOUTH TWICE A DAY ON TUESDAY, THURSDAY, SATURDAY, SUNDAY AND TAKE 1 TABLET ON ALL OTHER DAYS .TAKE WITH FOOD     Multiple Vitamin (MULTIVITAMIN) capsule Take 1 capsule by mouth daily.     omeprazole  (PRILOSEC  OTC) 20 MG tablet Take 1 tablet (20 mg total) by mouth daily. 90 tablet 1   OXYQUINOLONE SULFATE VAGINAL (TRIMO-SAN) 0.025 % GEL Place 1 Applicatorful vaginally once a week. 113.4 g 2   pimecrolimus  (ELIDEL ) 1 % cream Apply topically 2 (two) times daily. 100 g 1   triamcinolone  cream (KENALOG ) 0.1 % Apply 1 Application topically 2 (two) times daily. 80  g 1   No current facility-administered medications for this visit.    Allergies as of 10/26/2023   (No Known Allergies)      ROS:  General: Negative for anorexia, weight loss, fever, chills, fatigue, weakness. ENT: Negative for hoarseness, difficulty swallowing , nasal congestion. CV: Negative for chest pain, angina, palpitations, dyspnea on exertion, peripheral edema.  Respiratory: Negative for dyspnea at rest, dyspnea on  exertion, cough, sputum, wheezing.  GI: See history of present illness. GU:  Negative for dysuria, hematuria, urinary incontinence, urinary frequency, nocturnal urination.  Endo: Negative for unusual weight change.    Physical Examination:   BP (!) 86/61 (BP Location: Left Arm, Patient Position: Sitting, Cuff Size: Normal)   Pulse (!) 114   Temp 97.8 F (36.6 C) (Oral)   Wt 139 lb 8 oz (63.3 kg)   LMP 04/21/2014 (Approximate)   BMI 22.86 kg/m   General: Well-nourished, well-developed in no acute distress.  Eyes: No icterus. Conjunctivae pink. Abdomen: Bowel sounds are normal, nontender, nondistended, no hepatosplenomegaly or masses, no abdominal bruits or hernia , no rebound or guarding.   Extremities: No lower extremity edema. No clubbing or deformities. Neuro: Alert and oriented x 3.  Grossly intact. Skin: Warm and dry, no jaundice.   Psych: Alert and cooperative, normal mood and affect.   Imaging Studies: No results found.  Assessment and Plan:   Frances Davidson is a 63 y.o. 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  Some history suggestive of oily stools.  Lost 10 pounds of weight.  Differentials infectious versus inflammatory bowel disease versus EPI.     Plan  Stool studies to rule out infection inflammation and obtain pancreatic elastase Trial of pancreatic enzymes Follow-up in 2 to 3 weeks if she is not doing better will require colonoscopy to rule out microscopic colitis or inflammatory bowel disease   Dr Ruel Kung  MD,MRCP [facility]) Follow up in 2 to 3 weeks

## 2023-10-26 NOTE — Patient Instructions (Addendum)
Zenpep samples take 2 capsules with every meal and 1 capsule with snack

## 2023-10-31 LAB — GI PROFILE, STOOL, PCR

## 2023-10-31 LAB — PANCREATIC ELASTASE, FECAL: Pancreatic Elastase, Fecal: 542 ug Elast./g (ref >200)

## 2023-10-31 LAB — CALPROTECTIN, FECAL: Calprotectin, Fecal: 240 ug/g — ABNORMAL HIGH (ref 0–120)

## 2023-11-01 ENCOUNTER — Encounter: Payer: Medicaid Other | Admitting: Urology

## 2023-11-01 LAB — C DIFFICILE, CYTOTOXIN B

## 2023-11-01 LAB — C DIFFICILE TOXINS A+B W/RFLX: C difficile Toxins A+B, EIA: NEGATIVE

## 2023-11-11 ENCOUNTER — Other Ambulatory Visit: Payer: Self-pay

## 2023-11-11 ENCOUNTER — Ambulatory Visit: Payer: Medicare Other | Admitting: Gastroenterology

## 2023-11-11 ENCOUNTER — Telehealth: Payer: Self-pay

## 2023-11-11 VITALS — BP 108/77 | HR 112 | Temp 98.2°F | Wt 141.0 lb

## 2023-11-11 DIAGNOSIS — K580 Irritable bowel syndrome with diarrhea: Secondary | ICD-10-CM | POA: Diagnosis not present

## 2023-11-11 DIAGNOSIS — R197 Diarrhea, unspecified: Secondary | ICD-10-CM

## 2023-11-11 MED ORDER — NA SULFATE-K SULFATE-MG SULF 17.5-3.13-1.6 GM/177ML PO SOLN: 354.0000 mL | Freq: Once | ORAL | 0 refills | Status: AC

## 2023-11-11 NOTE — Telephone Encounter (Signed)
 Called patient and had to leave her a voicemail letting her know that I had gone to her room to schedule her a colonoscopy per Dr. Tobi Bastos and she had left. Therefore, I was calling her to see if she was interested in scheduling a colonoscopy or not and if she was, to please call me back. I left her my direct number to call.

## 2023-11-11 NOTE — Progress Notes (Signed)
 I went to patient's room and patient had left the room and office. I will call her and ask if she was interested in scheduling a colonoscopy next week per Dr. Johnney Killian recommendation.

## 2023-11-11 NOTE — Addendum Note (Signed)
 Addended by: Adela Ports on: 11/11/2023 02:43 PM   Modules accepted: Orders

## 2023-11-11 NOTE — Progress Notes (Signed)
 Wyline Mood MD, MRCP(U.K) [address]  [address]  [place], [place] [address]  Main: [phone]  Fax: [phone]   Primary Care Physician: Larae Grooms, NP  Primary Gastroenterologist:  Dr. Wyline Mood   Chief Complaint  Patient presents with   IBS-D    HPI: Frances Davidson is a 63 y.o. female Summary of history :   This is a patient was previously seen Dr. Maximino Greenland in October 2022 for 5-year history of loose stools 2-4 loose bowel movements a day no blood in the stool associated with bloating fatigue.   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  Was also noted to have elevated transaminases.   Lab work in October 2022 showed mild elevation of AST at 66 b, not immune to hepatitis A and B.  Autoimmune work-up was negative as well as viral hepatitis work-up.  Right upper quadrant ultrasound in July 2022 showed hepatic steatosis.  She did not follow-up after.   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 pylori no evidence of celiac disease either 12/2022 seen at the emergency room for abdominal pain resolving after multiple bowel movements.  CT scan performed as below.  Given Augmentin and steroids discharged   12/2022: Mild diffuse colonic wall thickening involving the distal transverse and descending colon, suggesting nonspecific infectious or inflammatory colitis. 2. Descending and sigmoid diverticulosis, which does not appear to be the nidus of inflammatory findings described above. 3. Hepatic steatosis.    Interval history 10/26/2023-11/11/2023   At her last visit she stated that she  has had long-term diarrhea which is worse recently up to 5 times a  day nonbloody usually right after she drinks water with lower abdominal discomfort relieved after bowel movement lost 10 pounds of weight.  Denies any NSAIDs.  Tell her stools are oily which floats on top.  10/28/2023: GI PCR and stool for C. difficile negative.  Pancreatic elastase normal.  Fecal calprotectin elevated at 240  Continues to have diarrhea the pancreatic enzyme supplements did not help.  Nonbloody.  Up to 5 times a day.  Current Outpatient Medications  Medication Sig Dispense Refill   atorvastatin (LIPITOR) 20 MG tablet Take 1 tablet (20 mg total) by mouth daily. 90 tablet 1   Calcium Carbonate-Vitamin D (CALCIUM-VITAMIN D) 600-3.125 MG-MCG TABS Take 1 capsule by mouth daily.     cetirizine (ZYRTEC) 10 MG tablet Take 1 tablet (10 mg total) by mouth daily. 30 tablet 11   conjugated estrogens (PREMARIN) vaginal cream Place 0.5 grams vaginally 3 times per week using applicator. 42.5 g 6   Dupilumab (DUPIXENT) 300 MG/2ML SOAJ Inject 300 mg into the skin every 14 (fourteen) days. Starting at day 15 for maintenance. 4 mL 6   gabapentin (NEURONTIN) 100 MG capsule Take by mouth.     hydroxychloroquine (PLAQUENIL) 200 MG tablet TAKE 1 TABLET BY MOUTH TWICE A DAY ON TUESDAY, THURSDAY, SATURDAY, SUNDAY AND TAKE 1 TABLET ON ALL OTHER DAYS .TAKE WITH FOOD     Multiple Vitamin (MULTIVITAMIN) capsule Take 1 capsule by mouth daily.     omeprazole (PRILOSEC OTC) 20 MG tablet Take 1 tablet (20 mg total) by mouth daily. 90 tablet 1   OXYQUINOLONE SULFATE VAGINAL (  TRIMO-SAN) 0.025 % GEL Place 1 Applicatorful vaginally once a week. 113.4 g 2   Pancrelipase, Lip-Prot-Amyl, (ZENPEP) 40000-126000 units CPEP 2 capsules with every meal and 1 capsule with snack     pimecrolimus (ELIDEL) 1 % cream Apply topically 2 (two) times daily. 100 g 1   triamcinolone cream (KENALOG) 0.1 % Apply 1 Application topically 2 (two) times daily. 80 g 1   No current facility-administered medications for this visit.     Allergies as of 11/11/2023   (No Known Allergies)     ROS:  General: Negative for anorexia, weight loss, fever, chills, fatigue, weakness. ENT: Negative for hoarseness, difficulty swallowing , nasal congestion. CV: Negative for chest pain, angina, palpitations, dyspnea on exertion, peripheral edema.  Respiratory: Negative for dyspnea at rest, dyspnea on exertion, cough, sputum, wheezing.  GI: See history of present illness. GU:  Negative for dysuria, hematuria, urinary incontinence, urinary frequency, nocturnal urination.  Endo: Negative for unusual weight change.    Physical Examination:   BP 108/77   Pulse (!) 112   Temp 98.2 F (36.8 C) (Oral)   Wt 141 lb (64 kg)   LMP 04/21/2014 (Approximate)   BMI 23.11 kg/m   General: Well-nourished, well-developed in no acute distress.  Eyes: No icterus. Conjunctivae pink. Mouth: Oropharyngeal mucosa moist and pink , no lesions erythema or exudate. Neuro: Alert and oriented x 3.  Grossly intact. Skin: Warm and dry, no jaundice.   Psych: Alert and cooperative, normal mood and affect.   Imaging Studies: No results found.  Assessment and Plan:   Frances Davidson is a 63 y.o. 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  Some history suggestive of oily stools.  Lost 10 pounds of weight.  Her diarrhea continues since last visit no relief with Creon.  Fecal calprotectin is elevated.  Concern for inflammatory bowel disease versus microscopic colitis.  Discussed about proceeding with a colonoscopy and taking biopsies intubation of the terminal ileum and ruling out inflammation over there as well.  She also says she has issues with hemorrhoids and explained to her I will look at them during her colonoscopy discussed based on her history may be an external hemorrhoid versus a skin tag   I have discussed alternative options, risks & benefits,  which include, but  are not limited to, bleeding, infection, perforation,respiratory complication & drug reaction.  The patient agrees with this plan & written consent will be obtained.       Dr Wyline Mood  MD,MRCP [facility] [place] [facility]) Follow up in 8 to 12 weeks

## 2023-11-22 ENCOUNTER — Telehealth: Payer: Self-pay

## 2023-11-22 NOTE — Telephone Encounter (Signed)
 Patient needs a sample of Dupixent since something went wrong with her injection at home. I advised patient she could stop by during office hours and we could give her a sample. She said she will stop by today.

## 2023-11-23 ENCOUNTER — Encounter: Payer: Self-pay | Admitting: Nurse Practitioner

## 2023-11-24 ENCOUNTER — Other Ambulatory Visit: Payer: Self-pay | Admitting: Dermatology

## 2023-12-02 ENCOUNTER — Other Ambulatory Visit: Payer: Self-pay

## 2023-12-02 DIAGNOSIS — R197 Diarrhea, unspecified: Secondary | ICD-10-CM

## 2023-12-02 MED ORDER — NA SULFATE-K SULFATE-MG SULF 17.5-3.13-1.6 GM/177ML PO SOLN: 354.0000 mL | Freq: Once | ORAL | 0 refills | Status: AC

## 2023-12-02 NOTE — Telephone Encounter (Signed)
 Patient called back stating that she continues to have diarrhea. Therefore, I had asked her to schedule a colonoscopy since she is continuing to have diarrhea. Patient stated that she would but that it would be until next month since she will be leaving out of town next week and then the beginning of April. Patient chose to have her colonoscopy on 01/04/2024. I let her know that I would be sending her the instructions for colonoscopy via MyChart and by mail. Patient agreed and had no further questions.

## 2023-12-06 DIAGNOSIS — M359 Systemic involvement of connective tissue, unspecified: Secondary | ICD-10-CM | POA: Diagnosis not present

## 2023-12-06 DIAGNOSIS — R7989 Other specified abnormal findings of blood chemistry: Secondary | ICD-10-CM | POA: Diagnosis not present

## 2023-12-06 DIAGNOSIS — R21 Rash and other nonspecific skin eruption: Secondary | ICD-10-CM | POA: Diagnosis not present

## 2023-12-06 DIAGNOSIS — L988 Other specified disorders of the skin and subcutaneous tissue: Secondary | ICD-10-CM | POA: Diagnosis not present

## 2023-12-06 DIAGNOSIS — Z79899 Other long term (current) drug therapy: Secondary | ICD-10-CM | POA: Diagnosis not present

## 2023-12-13 ENCOUNTER — Ambulatory Visit: Payer: Medicaid Other | Admitting: Dermatology

## 2024-01-04 ENCOUNTER — Ambulatory Visit
Admission: RE | Admit: 2024-01-04 | Discharge: 2024-01-04 | Disposition: A | Discharge status: Home / Self Care | Attending: Gastroenterology | Admitting: Gastroenterology

## 2024-01-04 ENCOUNTER — Encounter: Payer: Self-pay | Admitting: Gastroenterology

## 2024-01-04 ENCOUNTER — Ambulatory Visit: Admitting: Anesthesiology

## 2024-01-04 ENCOUNTER — Encounter
Admission: RE | Disposition: A | Discharge status: Home / Self Care | Payer: Self-pay | Source: Home / Self Care | Attending: Gastroenterology

## 2024-01-04 ENCOUNTER — Other Ambulatory Visit: Payer: Self-pay

## 2024-01-04 DIAGNOSIS — K529 Noninfective gastroenteritis and colitis, unspecified: Secondary | ICD-10-CM | POA: Diagnosis present

## 2024-01-04 DIAGNOSIS — K219 Gastro-esophageal reflux disease without esophagitis: Secondary | ICD-10-CM | POA: Diagnosis not present

## 2024-01-04 DIAGNOSIS — K5730 Diverticulosis of large intestine without perforation or abscess without bleeding: Secondary | ICD-10-CM | POA: Diagnosis not present

## 2024-01-04 DIAGNOSIS — R197 Diarrhea, unspecified: Secondary | ICD-10-CM

## 2024-01-04 HISTORY — PX: COLONOSCOPY: SHX5424

## 2024-01-04 HISTORY — PX: BIOPSY OF SKIN SUBCUTANEOUS TISSUE AND/OR MUCOUS MEMBRANE: SHX6741

## 2024-01-04 SURGERY — COLONOSCOPY
Anesthesia: General

## 2024-01-04 MED ORDER — PROPOFOL 10 MG/ML IV BOLUS
INTRAVENOUS | Status: DC | PRN
  Administered 2024-01-04: 80 mg via INTRAVENOUS

## 2024-01-04 MED ORDER — PROPOFOL 500 MG/50ML IV EMUL
INTRAVENOUS | Status: DC | PRN
  Administered 2024-01-04: 150 ug/kg/min via INTRAVENOUS

## 2024-01-04 MED ORDER — SODIUM CHLORIDE 0.9 % IV SOLN: INTRAVENOUS | Status: DC

## 2024-01-04 MED ORDER — DEXMEDETOMIDINE HCL IN NACL 80 MCG/20ML IV SOLN
INTRAVENOUS | Status: DC | PRN
  Administered 2024-01-04: 4 ug via INTRAVENOUS

## 2024-01-04 MED ORDER — LIDOCAINE HCL (CARDIAC) PF 100 MG/5ML IV SOSY
PREFILLED_SYRINGE | INTRAVENOUS | Status: DC | PRN
  Administered 2024-01-04: 40 mg via INTRAVENOUS

## 2024-01-04 NOTE — Op Note (Signed)
 [facility] Gastroenterology Patient Name: Frances Davidson Procedure Date: 01/04/2024 8:52 AM MRN: [id number] Account #: [id number] Date of Birth: [birth year] Admit Type: Outpatient Age: 63 Room: [facility] ENDO ROOM 2 Gender: Female Note Status: Finalized Instrument Name: Charlyn Cooley [id number] Procedure:             Colonoscopy Indications:           Chronic diarrhea Providers:             Luke Salaam MD, MD Referring MD:          Aileen Alexanders (Referring MD) Medicines:             Propofol per Anesthesia, Monitored Anesthesia Care Complications:         No immediate complications. Procedure:             Pre-Anesthesia Assessment:                        - Prior to the procedure, a History and Physical was                         performed, and patient medications, allergies and                         sensitivities were reviewed. The patient's tolerance                         of previous anesthesia was reviewed.                        - The risks and benefits of the procedure and the                         sedation options and risks were discussed with the                         patient. All questions were answered and informed                         consent was obtained.                        - ASA Grade Assessment: II - A patient with mild                         systemic disease.                        After obtaining informed consent, the colonoscope was                         passed under direct vision. Throughout the procedure,                         the patient's blood pressure, pulse, and oxygen                         saturations were monitored continuously. The                         Colonoscope was introduced through  the anus and                         advanced to the the cecum, identified by the                         appendiceal orifice. The colonoscopy was performed                         with ease. The patient tolerated the procedure well.                          The quality of the bowel preparation was good. The                         ileocecal valve, appendiceal orifice, and rectum were                         photographed. Findings:      The perianal and digital rectal examinations were normal.      Multiple medium-mouthed diverticula were found in the sigmoid colon.      Normal mucosa was found in the entire colon. minimal barotrauma seen in       the cecum , area washed and underlying mucosa appeared normal Biopsies       were taken with a cold forceps for histology.      The exam was otherwise without abnormality on direct and retroflexion       views. Impression:            - Diverticulosis in the sigmoid colon.                        - Normal mucosa in the entire examined colon. Biopsied.                        - The examination was otherwise normal on direct and                         retroflexion views. Recommendation:        - Discharge patient to home (with escort).                        - Resume previous diet.                        - Continue present medications.                        - Await pathology results.                        - Repeat colonoscopy in 10 years for screening                         purposes. Procedure Code(s):     --- Professional ---                        [phone], Colonoscopy, flexible; with biopsy, single or  multiple Diagnosis Code(s):     --- Professional ---                        K52.9, Noninfective gastroenteritis and colitis,                         unspecified                        K57.30, Diverticulosis of large intestine without                         perforation or abscess without bleeding CPT copyright 2022 American Medical Association. All rights reserved. The codes documented in this report are preliminary and upon coder review may  be revised to meet current compliance requirements. Luke Salaam, MD Luke Salaam MD, MD 01/04/2024 9:11:51 AM This  report has been signed electronically. Number of Addenda: 0 Note Initiated On: 01/04/2024 8:52 AM Scope Withdrawal Time: 0 hours 6 minutes 35 seconds  Total Procedure Duration: 0 hours 10 minutes 57 seconds  Estimated Blood Loss:  Estimated blood loss: none.      [facility]

## 2024-01-04 NOTE — Progress Notes (Signed)
 Rash all over her body before she came in due to the connective tissue disease

## 2024-01-04 NOTE — H&P (Signed)
 Wyline Mood, MD [address], [place], [place], [address] [address], [address], [place], [place], [address] Phone: [phone]  Fax: [phone]  Primary Care Physician:  Larae Grooms, NP   Pre-Procedure History & Physical: HPI:  Frances Davidson is a 63 y.o. female is here for an colonoscopy.   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   Undifferentiated connective tissue disease (HCC)     Past Surgical History:  Procedure Laterality Date   BREAST EXCISIONAL BIOPSY Left 1976   COLONOSCOPY WITH PROPOFOL N/A 07/22/2021   Procedure: COLONOSCOPY WITH PROPOFOL;  Surgeon: Pasty Spillers, MD;  Location: [facility] ENDOSCOPY;  Service: Endoscopy;  Laterality: N/A;   ESOPHAGOGASTRODUODENOSCOPY N/A 07/22/2021   Procedure: ESOPHAGOGASTRODUODENOSCOPY (EGD);  Surgeon: Pasty Spillers, MD;  Location: [facility] ENDOSCOPY;  Service: Endoscopy;  Laterality: N/A;   LAPAROSCOPIC APPENDECTOMY N/A 10/27/2016   Procedure: APPENDECTOMY LAPAROSCOPIC;  Surgeon: Henrene Dodge, MD;  Location: [facility] ORS;  Service: General;  Laterality: N/A;   TUBAL LIGATION  1985    Prior to Admission medications   Medication Sig Start Date End Date Taking? Authorizing Provider  atorvastatin (LIPITOR) 20 MG tablet Take 1 tablet (20 mg total) by mouth daily. 08/09/23  Yes Larae Grooms, NP  Calcium Carbonate-Vitamin D (CALCIUM-VITAMIN D) 600-3.125 MG-MCG TABS Take 1 capsule by mouth daily.   Yes [provider]  cetirizine (ZYRTEC) 10 MG tablet Take 1 tablet (10 mg total) by mouth daily. 08/09/23  Yes Larae Grooms, NP  conjugated estrogens (PREMARIN) vaginal cream Place 0.5 grams vaginally 3 times per week using applicator. 05/25/23  Yes Hildred Laser, MD  Dupilumab (DUPIXENT) 300 MG/2ML SOAJ Inject 300 mg into the skin every 14 (fourteen) days. Starting at day 15 for maintenance. 09/20/23  Yes Elie Goody, MD  gabapentin (NEURONTIN) 100 MG capsule Take by mouth. 12/10/22  Yes [provider]  hydroxychloroquine (PLAQUENIL) 200 MG tablet TAKE 1 TABLET BY MOUTH TWICE A DAY ON TUESDAY, THURSDAY, SATURDAY, SUNDAY AND TAKE 1 TABLET ON ALL OTHER DAYS .TAKE WITH FOOD 01/04/23  Yes [provider]  Multiple Vitamin (MULTIVITAMIN) capsule Take 1 capsule by mouth daily.   Yes [provider]  omeprazole (PRILOSEC OTC) 20 MG tablet Take 1 tablet (20 mg total) by mouth daily. 08/09/23  Yes Larae Grooms, NP  OXYQUINOLONE SULFATE VAGINAL (TRIMO-SAN) 0.025 % GEL Place 1 Applicatorful vaginally once a week. 06/09/23  Yes Hildred Laser, MD  Pancrelipase, Lip-Prot-Amyl, (ZENPEP) [phone] units CPEP 2 capsules with every meal and 1 capsule with snack 10/26/23  Yes Wyline Mood, MD  pimecrolimus (ELIDEL) 1 % cream APPLY TO THE AFFECTED AREA(S) 2 TIMES A DAY 11/24/23  Yes Elie Goody, MD  triamcinolone cream (KENALOG) 0.1 % Apply 1 Application topically 2 (two) times daily. 02/23/23  Yes Larae Grooms, NP    Allergies as of 12/02/2023   (No Known Allergies)    Family History  Problem Relation Age of Onset   Diabetes Mother    Bladder Cancer Mother    Cervical cancer Mother    Thyroid cancer Mother    Lung cancer Father    Hypertension Father    Hypertension Brother    Hypertension Daughter    Hypertension Son    Diabetes Son    Cancer Maternal Grandmother    Diabetes Maternal Grandmother    Cancer Maternal Grandfather    Diabetes Maternal Grandfather  Cancer Paternal Grandmother    Diabetes Paternal Grandmother    Cancer Paternal Grandfather    Diabetes Paternal Grandfather    Lymphoma Brother    Breast cancer Neg Hx     Social History   Socioeconomic History   Marital status: Married    Spouse name: Not on file   Number of children: 3   Years of education: Not on file   Highest education level: Associate degree: academic program   Occupational History   Not on file  Tobacco Use   Smoking status: Never   Smokeless tobacco: Never  Vaping Use   Vaping status: Never Used  Substance and Sexual Activity   Alcohol use: Yes    Comment: occasional   Drug use: No   Sexual activity: Yes    Birth control/protection: Post-menopausal  Other Topics Concern   Not on file  Social History Narrative   ** Merged History Encounter **       Social Drivers of Health   Financial Resource Strain: Medium Risk (08/05/2023)   Overall Financial Resource Strain (CARDIA)    Difficulty of Paying Living Expenses: Somewhat hard  Food Insecurity: Food Insecurity Present (08/05/2023)   Hunger Vital Sign    Worried About Running Out of Food in the Last Year: Often true    Ran Out of Food in the Last Year: Often true  Transportation Needs: Unmet Transportation Needs (08/05/2023)   PRAPARE - Administrator, Civil Service (Medical): Yes    Lack of Transportation (Non-Medical): No  Physical Activity: Insufficiently Active (08/05/2023)   Exercise Vital Sign    Days of Exercise per Week: 1 day    Minutes of Exercise per Session: 10 min  Stress: No Stress Concern Present (08/05/2023)   [employer] of Occupational Health - Occupational Stress Questionnaire    Feeling of Stress : Not at all  Social Connections: Moderately Integrated (08/05/2023)   Social Connection and Isolation Panel [NHANES]    Frequency of Communication with Friends and Family: Twice a week    Frequency of Social Gatherings with Friends and Family: Once a week    Attends Religious Services: 1 to 4 times per year    Active Member of [employer] or Organizations: No    Attends Engineer, structural: Not on file    Marital Status: Married  Catering manager Violence: Not on file    Review of Systems: See HPI, otherwise negative ROS  Physical Exam: LMP 04/21/2014 (Approximate)  General:   Alert,  pleasant and cooperative in NAD Head:   Normocephalic and atraumatic. Neck:  Supple; no masses or thyromegaly. Lungs:  Clear throughout to auscultation, normal respiratory effort.    Heart:  +S1, +S2, Regular rate and rhythm, No edema. Abdomen:  Soft, nontender and nondistended. Normal bowel sounds, without guarding, and without rebound.   Neurologic:  Alert and  oriented x4;  grossly normal neurologically.  Impression/Plan: Frances Davidson is here for an colonoscopy to be performed for diarrhea Risks, benefits, limitations, and alternatives regarding  colonoscopy have been reviewed with the patient.  Questions have been answered.  All parties agreeable.   Luke Salaam, MD  01/04/2024, 8:20 AM

## 2024-01-04 NOTE — Anesthesia Procedure Notes (Signed)
 Date/Time: 01/04/2024 9:00 AM  Performed by: Racheal Buddle, CRNAPre-anesthesia Checklist: Patient identified, Emergency Drugs available, Suction available and Patient being monitored Patient Re-evaluated:Patient Re-evaluated prior to induction Oxygen Delivery Method: Nasal cannula Induction Type: IV induction Dental Injury: Teeth and Oropharynx as per pre-operative assessment  Comments: Nasal cannula with etCO2 monitoring

## 2024-01-04 NOTE — Anesthesia Postprocedure Evaluation (Signed)
 Anesthesia Post Note  Patient: Frances Davidson  Procedure(s) Performed: COLONOSCOPY BIOPSY, SKIN, SUBCUTANEOUS TISSUE, OR MUCOUS MEMBRANE  Patient location during evaluation: PACU Anesthesia Type: General Level of consciousness: awake and alert, oriented and patient cooperative Pain management: pain level controlled Vital Signs Assessment: post-procedure vital signs reviewed and stable Respiratory status: spontaneous breathing, nonlabored ventilation and respiratory function stable Cardiovascular status: blood pressure returned to baseline and stable Postop Assessment: adequate PO intake Anesthetic complications: no   No notable events documented.   Last Vitals:  Vitals:   01/04/24 0925 01/04/24 0935  BP: (!) 106/93 109/82  Pulse: 86 88  Resp: 19 18  Temp:    SpO2: 100% 100%    Last Pain:  Vitals:   01/04/24 0935  TempSrc:   PainSc: 0-No pain                 Dorothey Gate

## 2024-01-04 NOTE — Anesthesia Preprocedure Evaluation (Addendum)
 Anesthesia Evaluation  Patient identified by MRN, date of birth, ID band Patient awake    Reviewed: Allergy & Precautions, NPO status , Patient's Chart, lab work & pertinent test results  History of Anesthesia Complications Negative for: history of anesthetic complications  Airway Mallampati: III   Neck ROM: Full    Dental  (+) Missing, Chipped   Pulmonary neg pulmonary ROS   Pulmonary exam normal breath sounds clear to auscultation       Cardiovascular Exercise Tolerance: Good negative cardio ROS Normal cardiovascular exam Rhythm:Regular Rate:Normal     Neuro/Psych Alcohol use disorder, one beer daily    GI/Hepatic ,GERD  ,,  Endo/Other  negative endocrine ROS    Renal/GU negative Renal ROS     Musculoskeletal   Abdominal   Peds  Hematology  (+) Blood dyscrasia, anemia   Anesthesia Other Findings   Reproductive/Obstetrics                             Anesthesia Physical Anesthesia Plan  ASA: 2  Anesthesia Plan: General   Post-op Pain Management:    Induction: Intravenous  PONV Risk Score and Plan: 3 and Propofol infusion, TIVA and Treatment may vary due to age or medical condition  Airway Management Planned: Natural Airway  Additional Equipment:   Intra-op Plan:   Post-operative Plan:   Informed Consent: I have reviewed the patients History and Physical, chart, labs and discussed the procedure including the risks, benefits and alternatives for the proposed anesthesia with the patient or authorized representative who has indicated his/her understanding and acceptance.       Plan Discussed with: CRNA  Anesthesia Plan Comments: (LMA/GETA backup discussed.  Patient consented for risks of anesthesia including but not limited to:  - adverse reactions to medications - damage to eyes, teeth, lips or other oral mucosa - nerve damage due to positioning  - sore throat or  hoarseness - damage to heart, brain, nerves, lungs, other parts of body or loss of life  Informed patient about role of CRNA in peri- and intra-operative care.  Patient voiced understanding.)       Anesthesia Quick Evaluation

## 2024-01-04 NOTE — Transfer of Care (Signed)
 Immediate Anesthesia Transfer of Care Note  Patient: Frances Davidson  Procedure(s) Performed: Procedure(s): COLONOSCOPY (N/A)  Patient Location: PACU and Endoscopy Unit  Anesthesia Type:General  Level of Consciousness: sedated  Airway & Oxygen Therapy: Patient Spontanous Breathing and Patient connected to nasal cannula oxygen  Post-op Assessment: Report given to RN and Post -op Vital signs reviewed and stable  Post vital signs: Reviewed and stable  Last Vitals:  Vitals:   01/04/24 0818 01/04/24 0915  BP: 121/88   Pulse: 87 85  Resp: 18 13  Temp: (!) 36.1 C (!) 36 C  SpO2: 100% 100%    Complications: No apparent anesthesia complications

## 2024-01-05 ENCOUNTER — Encounter: Payer: Self-pay | Admitting: Gastroenterology

## 2024-01-05 LAB — SURGICAL PATHOLOGY

## 2024-01-05 NOTE — Progress Notes (Deleted)
 New Patient Evaluation and Consultation  Referring Provider: Teresa Fender, MD PCP: Aileen Alexanders, NP Date of Service: 01/06/2024  SUBJECTIVE Chief Complaint: No chief complaint on file.  History of Present Illness: Frances Davidson is a 63 y.o. {ED SANE [phone] female seen in consultation at the request of Dr Denman Fischer for evaluation of pelvic organ prolapse.    Considering surgery for pelvic organ prolapse Treatment of urgency urinary incontinence Tried vaginal estrogen cream, size w ring with support pessary for grade 2 cystocele Mother with history of bladder cancer diagnosed at 65yo  ***Review of records significant for: ***appendiceal abscess, diarrhea, positive ANA, connective tissue disease, EtOH use  Urinary Symptoms: Does not leak urine.  Leaks *** time(s) per {days/wks/mos/yrs:310907}.  Pad use: {NUMBERS 1-10:18281} {pad option:24752} per day.   Patient {ACTION; IS/IS RJJ:[id number]} bothered by UI symptoms.  Day time voids 10.  Nocturia: 4 times per night to void. Voiding dysfunction:  does not empty bladder well.  Patient does not use a catheter to empty bladder.  When urinating, patient feels dribbling after finishing and the need to urinate multiple times in a row Drinks: ***oz water per day  UTIs:  0  UTI's in the last year.   {ACTIONS;DENIES/REPORTS:21021675::"Denies"} history of blood in urine, kidney or bladder stones, pyelonephritis, bladder cancer, and kidney cancer No results found for the last 90 days.   Pelvic Organ Prolapse Symptoms:                  Patient Denies a feeling of a bulge the vaginal area. It has been present for {NUMBER 1-10:22536} {days/wks/mos/yrs:310907}.  Patient {denies/ admits to:24761} seeing a bulge.  This bulge {ACTION; IS/IS TKZ:[id number]} bothersome.  Bowel Symptom: Bowel movements: 3 time(s) per day with IBS-D Stool consistency: loose Straining: yes.  Splinting: yes.  Incomplete evacuation: yes.  Patient Admits to  accidental bowel leakage / fecal incontinence  Occurs: 2 time(s) per month  Consistency with leakage: liquid Bowel regimen:  imodium Last colonoscopy: Results diverticulosis HM Colonoscopy          Upcoming     Colonoscopy (Every 10 Years) Next due on 01/03/2034    01/04/2024  COLONOSCOPY   Only the first 1 history entries have been loaded, but more history exists.                Sexual Function Sexually active: yes.  Sexual orientation: Straight Pain with sex: Yes, at the vaginal opening  Pelvic Pain Denies pelvic pain   Past Medical History:  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   Undifferentiated connective tissue disease (HCC)      Past Surgical History:   Past Surgical History:  Procedure Laterality Date   BIOPSY OF SKIN SUBCUTANEOUS TISSUE AND/OR MUCOUS MEMBRANE  01/04/2024   Procedure: BIOPSY, SKIN, SUBCUTANEOUS TISSUE, OR MUCOUS MEMBRANE;  Surgeon: Luke Salaam, MD;  Location: [facility] ENDOSCOPY;  Service: Gastroenterology;;   BREAST EXCISIONAL BIOPSY Left 1976   COLONOSCOPY N/A 01/04/2024   Procedure: COLONOSCOPY;  Surgeon: Luke Salaam, MD;  Location: [facility] ENDOSCOPY;  Service: Gastroenterology;  Laterality: N/A;   COLONOSCOPY WITH PROPOFOL N/A 07/22/2021   Procedure: COLONOSCOPY WITH PROPOFOL;  Surgeon: Irby Mannan, MD;  Location: [facility] ENDOSCOPY;  Service: Endoscopy;  Laterality: N/A;   ESOPHAGOGASTRODUODENOSCOPY N/A 07/22/2021   Procedure: ESOPHAGOGASTRODUODENOSCOPY (EGD);  Surgeon: Irby Mannan, MD;  Location: [facility] ENDOSCOPY;  Service: Endoscopy;  Laterality: N/A;   LAPAROSCOPIC  APPENDECTOMY N/A 10/27/2016   Procedure: APPENDECTOMY LAPAROSCOPIC;  Surgeon: Henrene Dodge, MD;  Location: [facility] ORS;  Service: General;  Laterality: N/A;   TUBAL LIGATION  1985     Past OB/GYN History: OB History  Gravida Para Term Preterm AB Living  3 3 3   3   SAB IAB Ectopic  Multiple Live Births      3    # Outcome Date GA Lbr Len/2nd Weight Sex Type Anes PTL Lv  3 Term      Vag-Spont     2 Term      Vag-Spont     1 Term      Vag-Spont       Vaginal deliveries: 3,  Forceps/ Vacuum deliveries: ***, Cesarean section: 0 Menopausal: Yes, at age 78, Denies vaginal bleeding since menopause Contraception: s/p menopause and BTL. Last pap smear.  Any history of abnormal pap smears: no.    Component Value Date/Time   DIAGPAP  02/18/2021 1000    - Negative for intraepithelial lesion or malignancy (NILM)   HPVHIGH Negative 02/18/2021 1000   ADEQPAP  02/18/2021 1000    Satisfactory for evaluation; transformation zone component PRESENT.    Medications: Patient has a current medication list which includes the following prescription(s): atorvastatin, calcium-vitamin d, cetirizine, premarin, dupixent, gabapentin, hydroxychloroquine, multivitamin, omeprazole, trimo-san, zenpep, pimecrolimus, and triamcinolone cream.   Allergies: Patient has no known allergies.   Social History:  Social History   Tobacco Use   Smoking status: Never   Smokeless tobacco: Never  Vaping Use   Vaping status: Never Used  Substance Use Topics   Alcohol use: Yes    Comment: occasional   Drug use: No    Relationship status: married Patient lives with her husband.   Patient is not employed. Regular exercise: No History of abuse: No  Family History:   Family History  Problem Relation Age of Onset   Diabetes Mother    Bladder Cancer Mother    Cervical cancer Mother    Thyroid cancer Mother    Lung cancer Father    Hypertension Father    Hypertension Brother    Hypertension Daughter    Hypertension Son    Diabetes Son    Cancer Maternal Grandmother    Diabetes Maternal Grandmother    Cancer Maternal Grandfather    Diabetes Maternal Grandfather    Cancer Paternal Grandmother    Diabetes Paternal Grandmother    Cancer Paternal Grandfather    Diabetes Paternal Grandfather     Lymphoma Brother    Breast cancer Neg Hx      Review of Systems: Review of Systems  Constitutional:  Positive for malaise/fatigue and weight loss. Negative for fever.  Respiratory:  Negative for cough, shortness of breath and wheezing.   Cardiovascular:  Negative for chest pain, palpitations and leg swelling.  Gastrointestinal:  Positive for abdominal pain. Negative for blood in stool.  Genitourinary:  Positive for frequency and urgency. Negative for dysuria and hematuria.  Skin:  Negative for rash.  Neurological:  Positive for weakness. Negative for dizziness and headaches.  Endo/Heme/Allergies:  Bruises/bleeds easily.       Hot flashes  Psychiatric/Behavioral:  Negative for depression. The patient is not nervous/anxious.      OBJECTIVE Physical Exam: There were no vitals filed for this visit.  OBGyn Exam  POP-Q:   POP-Q  Aa                                               Ba                                                 C                                                Gh                                               Pb                                               tvl                                                Ap                                               Bp                                                 D      Rectal Exam:  Normal sphincter tone, {rectocele:24766} distal rectocele, enterocoele {DESC; PRESENT/NOT PRESENT:21021351}, no rectal masses, {sign of:24767} dyssynergia when asking the patient to bear down.  Post-Void Residual (PVR) by Bladder Scan: In order to evaluate bladder emptying, we discussed obtaining a postvoid residual and patient agreed to this procedure.  Procedure: The ultrasound unit was placed on the patient's abdomen in the suprapubic region after the patient had voided.      Laboratory Results: Lab Results  Component Value Date   COLORU Yellow 03/10/2021   GLUCOSEUR  Negative 02/24/2023   BILIRUBINUR neg 02/24/2023   KETONESU neg 02/24/2023   SPECGRAV <1.005 (L) 03/10/2021   RBCUR neg 02/24/2023   PHUR 5.0 02/24/2023   PROTEINUR Negative 02/24/2023   UROBILINOGEN 0.2 02/24/2023   LEUKOCYTESUR Negative 02/24/2023    Lab Results  Component Value Date   CREATININE 0.84 08/09/2023   CREATININE 0.82 05/06/2023   CREATININE 0.72 12/28/2022    Lab Results  Component Value Date   HGBA1C 4.9 05/06/2023    Lab Results  Component Value Date   HGB 11.7 08/09/2023     ASSESSMENT AND PLAN Ms. Huard is a 63 y.o. with: No diagnosis found.  There are no diagnoses linked to  this encounter.   Darlene Ehlers, MD

## 2024-01-06 ENCOUNTER — Ambulatory Visit: Payer: Medicaid Other | Admitting: Obstetrics

## 2024-01-06 DIAGNOSIS — N941 Unspecified dyspareunia: Secondary | ICD-10-CM

## 2024-01-06 DIAGNOSIS — R351 Nocturia: Secondary | ICD-10-CM

## 2024-01-09 ENCOUNTER — Encounter: Payer: Self-pay | Admitting: Gastroenterology

## 2024-01-17 ENCOUNTER — Ambulatory Visit: Admitting: Dermatology

## 2024-01-24 ENCOUNTER — Encounter: Payer: Self-pay | Admitting: Dermatology

## 2024-01-24 ENCOUNTER — Ambulatory Visit (INDEPENDENT_AMBULATORY_CARE_PROVIDER_SITE_OTHER): Admitting: Dermatology

## 2024-01-24 DIAGNOSIS — L209 Atopic dermatitis, unspecified: Secondary | ICD-10-CM | POA: Diagnosis not present

## 2024-01-24 DIAGNOSIS — Z7189 Other specified counseling: Secondary | ICD-10-CM

## 2024-01-24 DIAGNOSIS — L281 Prurigo nodularis: Secondary | ICD-10-CM | POA: Diagnosis not present

## 2024-01-24 DIAGNOSIS — T148XXA Other injury of unspecified body region, initial encounter: Secondary | ICD-10-CM | POA: Diagnosis not present

## 2024-01-24 DIAGNOSIS — Z79899 Other long term (current) drug therapy: Secondary | ICD-10-CM

## 2024-01-24 MED ORDER — DUPILUMAB 300 MG/2ML ~~LOC~~ SOSY
600.0000 mg | PREFILLED_SYRINGE | Freq: Once | SUBCUTANEOUS | Status: AC
  Administered 2024-01-24: 600 mg via SUBCUTANEOUS

## 2024-01-24 MED ORDER — HYDROCORTISONE 2.5 % EX CREA: TOPICAL_CREAM | Freq: Two times a day (BID) | CUTANEOUS | 2 refills | Status: DC | PRN

## 2024-01-24 MED ORDER — DUPIXENT 300 MG/2ML ~~LOC~~ SOAJ: 300.0000 mg | SUBCUTANEOUS | 5 refills | Status: DC

## 2024-01-24 MED ORDER — CLOBETASOL PROPIONATE 0.05 % EX OINT: 1.0000 | TOPICAL_OINTMENT | Freq: Two times a day (BID) | CUTANEOUS | 2 refills | Status: DC

## 2024-01-24 NOTE — Progress Notes (Signed)
 Follow-Up Visit   Subjective  Frances Davidson is a 63 y.o. female who presents for the following: breaking out all over. Dupixent  was sent in 12/24 for patient and she said she took it for 3 months but then there was a change in her insurance and she discontinued. The only thing she is using now is OTC HC 1%.   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  The patient has spots, moles and lesions to be evaluated, some may be new or changing and the patient may have concern these could be cancer.   The following portions of the chart were reviewed this encounter and updated as appropriate: medications, allergies, medical history  Review of Systems:  No other skin or systemic complaints except as noted in HPI or Assessment and Plan.  Objective  Well appearing patient in no apparent distress; mood and affect are within normal limits.   A focused examination was performed of the following areas: Legs, arms, hands  Relevant exam findings are noted in the Assessment and Plan.    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 Scattered excoriations. 50% BSA  Chronic and persistent condition with duration or expected duration over one year. Condition is bothersome/symptomatic for patient. Currently flared.  Atopic dermatitis (eczema) is a chronic, relapsing, pruritic condition that can significantly affect quality of life. It is often associated with allergic rhinitis and/or asthma and can require treatment with topical medications, phototherapy, or in severe cases biologic injectable medication (Dupixent ; Adbry) or Oral JAK inhibitors.  Treatment Plan: Discussed restarting Dupixent  vs Ebglyss. Jointly decided to restart Dupixent  given prior efficacy  Will restart Dupixent  injections  today. Samples of Dupixent  300 mg/2 mL injected into bilateral upper arms today for a total of 600 mg/ 4 mL. Patient tolerated well.  Lot # [id number]  Exp: 01/2026  Maintenance dosing sent to [facility].   Sample given to patient to self-inject in 2 weeks on 02/07/24.  Lot # [id number]  Exp:06/20/2025  Dupilumab  (Dupixent ) is a treatment given by injection for adults and children with moderate-to-severe atopic dermatitis. Goal is control of skin condition, not cure. It is given as 2 injections at the first dose followed by 1 injection ever 2 weeks thereafter.  Young children are dosed monthly.  Potential side effects include allergic reaction, herpes infections, injection site reactions and conjunctivitis (inflammation of the eyes).  The use of Dupixent  requires long term medication management, including periodic office visits.  Start clobetasol ointment twice daily until smooth. Avoid applying to face, groin, and axilla. Use as directed. Long-term use can cause thinning of the skin.  Start HC 2.5% cr twice daily as needed to affected areas under breast groin gluteal cleft and under arms.   Topical steroids (such as triamcinolone , fluocinolone, fluocinonide, mometasone, clobetasol, halobetasol, betamethasone, hydrocortisone) can cause thinning and lightening of the skin if they are used for too long in the same area. Your physician has selected the right strength medicine for your problem and area affected on the body. Please use your medication only as directed by your physician to prevent side effects.    Recommend gentle skin care.   ATOPIC DERMATITIS, UNSPECIFIED TYPE   Related Medications dupilumab  (DUPIXENT ) prefilled syringe 600 mg  LONG-TERM USE OF HIGH-RISK MEDICATION   COUNSELING AND COORDINATION OF CARE   EXCORIATION  PRURIGO NODULARIS    Return in about 1 month (around 02/24/2024) for Atopic Dermatitis, with Dr. Felipe Horton, Dupixent .  Kerstin Peeling, RMA, am acting as scribe  for Harris Liming, MD .   Documentation: I have reviewed the above documentation for accuracy and completeness, and I agree with the above.  Harris Liming, MD

## 2024-01-24 NOTE — Patient Instructions (Addendum)
 Start clobetasol ointment twice daily until smooth. Avoid applying to face, groin, and axilla. Use as directed. Long-term use can cause thinning of the skin.  Start HC 2.5% cr twice daily as needed to affected areas under breast and under arms.  Dupixent  injection (sample given) is to be done in 2 weeks on 02/07/24.   Topical steroids (such as triamcinolone , fluocinolone, fluocinonide, mometasone, clobetasol, halobetasol, betamethasone, hydrocortisone) can cause thinning and lightening of the skin if they are used for too long in the same area. Your physician has selected the right strength medicine for your problem and area affected on the body. Please use your medication only as directed by your physician to prevent side effects.   Dupilumab  (Dupixent ) is a treatment given by injection for adults and children with moderate-to-severe atopic dermatitis. Goal is control of skin condition, not cure. It is given as 2 injections at the first dose followed by 1 injection ever 2 weeks thereafter.  Young children are dosed monthly.  Potential side effects include allergic reaction, herpes infections, injection site reactions and conjunctivitis (inflammation of the eyes).  The use of Dupixent  requires long term medication management, including periodic office visits.  Due to recent changes in healthcare laws, you may see results of your pathology and/or laboratory studies on MyChart before the doctors have had a chance to review them. We understand that in some cases there may be results that are confusing or concerning to you. Please understand that not all results are received at the same time and often the doctors may need to interpret multiple results in order to provide you with the best plan of care or course of treatment. Therefore, we ask that you please give us  2 business days to thoroughly review all your results before contacting the office for clarification. Should we see a critical lab result, you  will be contacted sooner.   If You Need Anything After Your Visit  If you have any questions or concerns for your doctor, please call our main line at [phone] and press option 4 to reach your doctor's medical assistant. If no one answers, please leave a voicemail as directed and we will return your call as soon as possible. Messages left after 4 pm will be answered the following business day.   You may also send us  a message via MyChart. We typically respond to MyChart messages within 1-2 business days.  For prescription refills, please ask your pharmacy to contact our office. Our fax number is [phone].  If you have an urgent issue when the clinic is closed that cannot wait until the next business day, you can page your doctor at the number below.    Please note that while we do our best to be available for urgent issues outside of office hours, we are not available 24/7.   If you have an urgent issue and are unable to reach us , you may choose to seek medical care at your doctor's office, retail clinic, urgent care center, or emergency room.  If you have a medical emergency, please immediately call 911 or go to the emergency department.  Pager Numbers  - Dr. Bary Likes: [phone]  - Dr. Annette Barters: [phone]  - Dr. Felipe Horton: [phone]   In the event of inclement weather, please call our main line at [phone] for an update on the status of any delays or closures.  Dermatology Medication Tips: Please keep the boxes that topical medications come in in order to help keep track of the instructions  about where and how to use these. Pharmacies typically print the medication instructions only on the boxes and not directly on the medication tubes.   If your medication is too expensive, please contact our office at [phone] option 4 or send us  a message through MyChart.   We are unable to tell what your co-pay for medications will be in advance as this is different  depending on your insurance coverage. However, we may be able to find a substitute medication at lower cost or fill out paperwork to get insurance to cover a needed medication.   If a prior authorization is required to get your medication covered by your insurance company, please allow us  1-2 business days to complete this process.  Drug prices often vary depending on where the prescription is filled and some pharmacies may offer cheaper prices.  The website www.goodrx.com contains coupons for medications through different pharmacies. The prices here do not account for what the cost may be with help from insurance (it may be cheaper with your insurance), but the website can give you the price if you did not use any insurance.  - You can print the associated coupon and take it with your prescription to the pharmacy.  - You may also stop by our office during regular business hours and pick up a GoodRx coupon card.  - If you need your prescription sent electronically to a different pharmacy, notify our office through [facility] or by phone at [phone] option 4.     Si Usted Necesita Algo Despus de Su Visita  Tambin puede enviarnos un mensaje a travs de Clinical cytogeneticist. Por lo general respondemos a los mensajes de MyChart en el transcurso de 1 a 2 das hbiles.  Para renovar recetas, por favor pida a su farmacia que se ponga en contacto con nuestra oficina. Franz Jacks de fax es [place] [phone].  Si tiene un asunto urgente cuando la clnica est cerrada y que no puede esperar hasta el siguiente da hbil, puede llamar/localizar a su doctor(a) al nmero que aparece a continuacin.   Por favor, tenga en cuenta que aunque hacemos todo lo posible para estar disponibles para asuntos urgentes fuera del horario de [place], no estamos disponibles las 24 horas del da, los 7 [address]  [address] de la [place].   Si tiene un problema urgente y no puede comunicarse con nosotros, puede optar por buscar atencin  mdica  en el consultorio de su doctor(a), en una clnica privada, en un centro de atencin urgente o en una sala de emergencias.  Si tiene Engineer, drilling, por favor llame inmediatamente al 911 o vaya a la sala de emergencias.  Nmeros de bper  - Dr. Bary Likes: [phone]  - Dra. Annette Barters: [phone]  - Dr. Felipe Horton: [phone]   En caso de inclemencias del tiempo, por favor llame a Lajuan Pila principal al [phone] para una actualizacin sobre el [place] de cualquier retraso o cierre.  Consejos para la medicacin en dermatologa: Por favor, guarde las cajas en las que vienen los medicamentos de uso tpico para ayudarle a seguir las instrucciones sobre dnde y cmo usarlos. Las farmacias generalmente imprimen las instrucciones del medicamento slo en las cajas y no directamente en los tubos del [place].   Si su medicamento es muy caro, por favor, pngase en contacto con Bettyjane Brunet llamando al [phone] y presione la opcin 4 o envenos un mensaje a travs de Clinical cytogeneticist.   No podemos decirle cul ser su copago por los medicamentos por adelantado ya  que esto es diferente dependiendo de la cobertura de su seguro. Sin embargo, es posible que podamos encontrar un medicamento sustituto a Audiological scientist un formulario para que el seguro cubra el medicamento que se considera necesario.   Si se requiere una autorizacin previa para que su compaa de seguros [place] su medicamento, por favor permtanos de 1 a 2 das hbiles para completar este proceso.  Los precios de los medicamentos varan con frecuencia dependiendo del Environmental consultant de dnde se surte la receta y alguna farmacias pueden ofrecer precios ms baratos.  El sitio web www.goodrx.com tiene cupones para medicamentos de Health and safety inspector. Los precios aqu no tienen en cuenta lo que podra costar con la ayuda del seguro (puede ser ms barato con su seguro), pero el sitio web puede darle el precio si no utiliz Producer, television/film/video.  - Puede imprimir el cupn correspondiente y llevarlo con su receta a la farmacia.  - Tambin puede pasar por nuestra oficina durante el horario de atencin regular y Education officer, museum una tarjeta de cupones de GoodRx.  - Si necesita que su receta se enve electrnicamente a una farmacia diferente, informe a nuestra oficina a travs de MyChart de [place] o por telfono llamando al [phone] y presione la opcin 4.

## 2024-02-01 ENCOUNTER — Telehealth: Payer: Self-pay

## 2024-02-01 NOTE — Telephone Encounter (Signed)
 Patient called regarding Dupixent . She does have a $1,200 copay. Patient advised next steps to call Dupixent  My way to see if she is eligible for free medication. aw

## 2024-02-08 ENCOUNTER — Ambulatory Visit: Payer: Medicaid Other | Admitting: Dermatology

## 2024-02-09 ENCOUNTER — Encounter: Payer: Self-pay | Admitting: Nurse Practitioner

## 2024-02-09 ENCOUNTER — Ambulatory Visit: Payer: Self-pay | Admitting: Nurse Practitioner

## 2024-02-09 VITALS — BP 93/61 | HR 98 | Temp 98.4°F | Resp 15 | Ht 65.51 in | Wt 142.0 lb

## 2024-02-09 DIAGNOSIS — Z0000 Encounter for general adult medical examination without abnormal findings: Secondary | ICD-10-CM | POA: Diagnosis not present

## 2024-02-09 DIAGNOSIS — Z136 Encounter for screening for cardiovascular disorders: Secondary | ICD-10-CM

## 2024-02-09 DIAGNOSIS — Z114 Encounter for screening for human immunodeficiency virus [HIV]: Secondary | ICD-10-CM

## 2024-02-09 DIAGNOSIS — M359 Systemic involvement of connective tissue, unspecified: Secondary | ICD-10-CM | POA: Diagnosis not present

## 2024-02-09 MED ORDER — CLOBETASOL PROPIONATE 0.05 % EX OINT
1.0000 | TOPICAL_OINTMENT | Freq: Two times a day (BID) | CUTANEOUS | 2 refills | Status: DC
Start: 2024-02-09 — End: 2024-02-24

## 2024-02-09 MED ORDER — PREDNISONE 10 MG PO TABS: 10.0000 mg | ORAL_TABLET | Freq: Every day | ORAL | 0 refills | Status: DC

## 2024-02-09 MED ORDER — GABAPENTIN 300 MG PO CAPS: 300.0000 mg | ORAL_CAPSULE | Freq: Every day | ORAL | 3 refills | Status: DC

## 2024-02-09 NOTE — Progress Notes (Signed)
 BP 93/61 (BP Location: Right Arm, Patient Position: Sitting, Cuff Size: Normal)   Pulse 98   Temp 98.4 F (36.9 C) (Oral)   Resp 15   Ht 5' 5.51" (1.664 m)   Wt 142 lb (64.4 kg)   LMP 04/21/2014 (Approximate)   SpO2 98%   BMI 23.26 kg/m    Subjective:    Patient ID: Frances Davidson, female    DOB: 07-19-1961, 63 y.o.   MRN: [id number]  HPI: Frances Davidson is a 63 y.o. female presenting on 02/09/2024 for comprehensive medical examination. Current medical complaints include:skin  She currently lives with: Menopausal Symptoms: no  Patient states she has seen the dermatologist for her skin disorder.  She has been having her skin break out and has been itching over the last 3 months.  She has been seeing Rheumatology who has her on Hydroxychloroquine which is not helping her symptoms.  She is also seeing Dermatology who started her on Dupixent - she has done 2 injections in the last month.    Depression Screen done today and results listed below:     08/09/2023    2:03 PM 05/06/2023    1:52 PM 01/26/2023    1:17 PM 10/28/2022    1:18 PM 04/24/2022    2:16 PM  Depression screen PHQ 2/9  Decreased Interest 0 0 0 0 0  Down, Depressed, Hopeless 0 0 0 0 0  PHQ - 2 Score 0 0 0 0 0  Altered sleeping 0 0 0 0 0  Tired, decreased energy 0 0 0 0 0  Change in appetite 0 0 0 0 0  Feeling bad or failure about yourself  0 0 0 0 0  Trouble concentrating 0 0 0 0 0  Moving slowly or fidgety/restless 0 0 0 0 0  Suicidal thoughts 0 0 0 0 0  PHQ-9 Score 0 0 0 0 0  Difficult doing work/chores  Not difficult at all  Not difficult at all Not difficult at all    The patient does not have a history of falls. I did complete a risk assessment for falls. A plan of care for falls was documented.   Past Medical History:  Past Medical History:  Diagnosis Date   Anemia    Anxiety    Arthritis    Blood transfusion without reported diagnosis    Cataract    Depression    GERD (gastroesophageal reflux  disease)    Glaucoma    Hyperlipidemia    Osteoporosis    Ruptured appendix 07/2016   Sleep apnea    Undifferentiated connective tissue disease (HCC)     Surgical History:  Past Surgical History:  Procedure Laterality Date   APPENDECTOMY     BIOPSY OF SKIN SUBCUTANEOUS TISSUE AND/OR MUCOUS MEMBRANE  01/04/2024   Procedure: BIOPSY, SKIN, SUBCUTANEOUS TISSUE, OR MUCOUS MEMBRANE;  Surgeon: Luke Salaam, MD;  Location: [facility] ENDOSCOPY;  Service: Gastroenterology;;   BREAST EXCISIONAL BIOPSY Left 1976   COLONOSCOPY N/A 01/04/2024   Procedure: COLONOSCOPY;  Surgeon: Luke Salaam, MD;  Location: [facility] ENDOSCOPY;  Service: Gastroenterology;  Laterality: N/A;   COLONOSCOPY WITH PROPOFOL  N/A 07/22/2021   Procedure: COLONOSCOPY WITH PROPOFOL ;  Surgeon: Irby Mannan, MD;  Location: [facility] ENDOSCOPY;  Service: Endoscopy;  Laterality: N/A;   ESOPHAGOGASTRODUODENOSCOPY N/A 07/22/2021   Procedure: ESOPHAGOGASTRODUODENOSCOPY (EGD);  Surgeon: Irby Mannan, MD;  Location: [facility] ENDOSCOPY;  Service: Endoscopy;  Laterality: N/A;   LAPAROSCOPIC APPENDECTOMY N/A 10/27/2016   Procedure: APPENDECTOMY LAPAROSCOPIC;  Surgeon:  Emmalene Hare, MD;  Location: [facility] ORS;  Service: General;  Laterality: N/A;   TUBAL LIGATION  1985    Medications:  Current Outpatient Medications on File Prior to Visit  Medication Sig   atorvastatin  (LIPITOR) 20 MG tablet Take 1 tablet (20 mg total) by mouth daily.   Calcium  Carbonate-Vitamin D (CALCIUM -VITAMIN D) 600-3.125 MG-MCG TABS Take 1 capsule by mouth daily.   cetirizine  (ZYRTEC ) 10 MG tablet Take 1 tablet (10 mg total) by mouth daily.   conjugated estrogens  (PREMARIN ) vaginal cream Place 0.5 grams vaginally 3 times per week using applicator.   Dupilumab  (DUPIXENT ) 300 MG/2ML SOAJ Inject 300 mg into the skin every 14 (fourteen) days. Starting at day 15 for maintenance.   Dupilumab  (DUPIXENT ) 300 MG/2ML SOAJ Inject 300 mg into the skin every 14 (fourteen) days.  Starting at day 15 for maintenance.   hydrocortisone  2.5 % cream Apply topically 2 (two) times daily as needed (Rash). To affected areas under breast and under arms.   hydroxychloroquine (PLAQUENIL) 200 MG tablet TAKE 1 TABLET BY MOUTH TWICE A DAY ON TUESDAY, THURSDAY, SATURDAY, SUNDAY AND TAKE 1 TABLET ON ALL OTHER DAYS .TAKE WITH FOOD   Multiple Vitamin (MULTIVITAMIN) capsule Take 1 capsule by mouth daily.   omeprazole  (PRILOSEC  OTC) 20 MG tablet Take 1 tablet (20 mg total) by mouth daily.   OXYQUINOLONE SULFATE VAGINAL (TRIMO-SAN) 0.025 % GEL Place 1 Applicatorful vaginally once a week.   Pancrelipase , Lip-Prot-Amyl, (ZENPEP ) 40000-126000 units CPEP 2 capsules with every meal and 1 capsule with snack   pimecrolimus  (ELIDEL ) 1 % cream APPLY TO THE AFFECTED AREA(S) 2 TIMES A DAY   triamcinolone  cream (KENALOG ) 0.1 % Apply 1 Application topically 2 (two) times daily.   No current facility-administered medications on file prior to visit.    Allergies:  No Known Allergies  Social History:  Social History   Socioeconomic History   Marital status: Married    Spouse name: Not on file   Number of children: 3   Years of education: Not on file   Highest education level: Some college, no degree  Occupational History   Not on file  Tobacco Use   Smoking status: Never   Smokeless tobacco: Never  Vaping Use   Vaping status: Never Used  Substance and Sexual Activity   Alcohol use: Not Currently    Comment: occasional   Drug use: No   Sexual activity: Yes    Birth control/protection: Post-menopausal  Other Topics Concern   Not on file  Social History Narrative   ** Merged History Encounter **       Social Drivers of Health   Financial Resource Strain: High Risk (02/07/2024)   Overall Financial Resource Strain (CARDIA)    Difficulty of Paying Living Expenses: Hard  Food Insecurity: Food Insecurity Present (02/07/2024)   Hunger Vital Sign    Worried About Running Out of Food in the  Last Year: Sometimes true    Ran Out of Food in the Last Year: Sometimes true  Transportation Needs: No Transportation Needs (02/07/2024)   PRAPARE - Administrator, Civil Service (Medical): No    Lack of Transportation (Non-Medical): No  Physical Activity: Insufficiently Active (02/07/2024)   Exercise Vital Sign    Days of Exercise per Week: 1 day    Minutes of Exercise per Session: 10 min  Stress: Stress Concern Present (02/07/2024)   [employer] of Occupational Health - Occupational Stress Questionnaire    Feeling of Stress :  To some extent  Social Connections: Socially Isolated (02/07/2024)   Social Connection and Isolation Panel [NHANES]    Frequency of Communication with Friends and Family: Twice a week    Frequency of Social Gatherings with Friends and Family: Never    Attends Religious Services: Never    Diplomatic Services operational officer: No    Attends Engineer, structural: Not on file    Marital Status: Married  Catering manager Violence: Not on file   Social History   Tobacco Use  Smoking Status Never  Smokeless Tobacco Never   Social History   Substance and Sexual Activity  Alcohol Use Not Currently   Comment: occasional    Family History:  Family History  Problem Relation Age of Onset   Diabetes Mother    Bladder Cancer Mother    Cervical cancer Mother    Thyroid cancer Mother    Lung cancer Father    Hypertension Father    Hypertension Brother    Hypertension Daughter    Hypertension Son    Diabetes Son    Cancer Maternal Grandmother    Diabetes Maternal Grandmother    Cancer Maternal Grandfather    Diabetes Maternal Grandfather    Cancer Paternal Grandmother    Diabetes Paternal Grandmother    Cancer Paternal Grandfather    Diabetes Paternal Grandfather    Lymphoma Brother    Breast cancer Neg Hx     Past medical history, surgical history, medications, allergies, family history and social history reviewed with  patient today and changes made to appropriate areas of the chart.   Review of Systems  Skin:  Positive for itching and rash.   All other ROS negative except what is listed above and in the HPI.      Objective:     BP 93/61 (BP Location: Right Arm, Patient Position: Sitting, Cuff Size: Normal)   Pulse 98   Temp 98.4 F (36.9 C) (Oral)   Resp 15   Ht 5' 5.51" (1.664 m)   Wt 142 lb (64.4 kg)   LMP 04/21/2014 (Approximate)   SpO2 98%   BMI 23.26 kg/m   Wt Readings from Last 3 Encounters:  02/09/24 142 lb (64.4 kg)  01/04/24 141 lb (64 kg)  11/11/23 141 lb (64 kg)    Physical Exam Vitals and nursing note reviewed.  Constitutional:      General: She is awake. She is not in acute distress.    Appearance: Normal appearance. She is well-developed. She is not ill-appearing.  HENT:     Head: Normocephalic and atraumatic.     Right Ear: Hearing, tympanic membrane, ear canal and external ear normal. No drainage.     Left Ear: Hearing, tympanic membrane, ear canal and external ear normal. No drainage.     Nose: Nose normal.     Right Sinus: No maxillary sinus tenderness or frontal sinus tenderness.     Left Sinus: No maxillary sinus tenderness or frontal sinus tenderness.     Mouth/Throat:     Mouth: Mucous membranes are moist.     Pharynx: Oropharynx is clear. Uvula midline. No pharyngeal swelling, oropharyngeal exudate or posterior oropharyngeal erythema.  Eyes:     General: Lids are normal.        Right eye: No discharge.        Left eye: No discharge.     Extraocular Movements: Extraocular movements intact.     Conjunctiva/sclera: Conjunctivae normal.     Pupils:  Pupils are equal, round, and reactive to light.     Visual Fields: Right eye visual fields normal and left eye visual fields normal.  Neck:     Thyroid: No thyromegaly.     Vascular: No carotid bruit.     Trachea: Trachea normal.  Cardiovascular:     Rate and Rhythm: Normal rate and regular rhythm.     Heart  sounds: Normal heart sounds. No murmur heard.    No gallop.  Pulmonary:     Effort: Pulmonary effort is normal. No accessory muscle usage or respiratory distress.     Breath sounds: Normal breath sounds.  Chest:  Breasts:    Right: Normal.     Left: Normal.  Abdominal:     General: Bowel sounds are normal.     Palpations: Abdomen is soft. There is no hepatomegaly or splenomegaly.     Tenderness: There is no abdominal tenderness.  Musculoskeletal:        General: Normal range of motion.     Cervical back: Normal range of motion and neck supple.     Right lower leg: No edema.     Left lower leg: No edema.  Lymphadenopathy:     Head:     Right side of head: No submental, submandibular, tonsillar, preauricular or posterior auricular adenopathy.     Left side of head: No submental, submandibular, tonsillar, preauricular or posterior auricular adenopathy.     Cervical: No cervical adenopathy.     Upper Body:     Right upper body: No supraclavicular, axillary or pectoral adenopathy.     Left upper body: No supraclavicular, axillary or pectoral adenopathy.  Skin:    General: Skin is warm and dry.     Capillary Refill: Capillary refill takes less than 2 seconds.     Findings: Rash present.       Neurological:     Mental Status: She is alert and oriented to person, place, and time.     Gait: Gait is intact.  Psychiatric:        Attention and Perception: Attention normal.        Mood and Affect: Mood normal.        Speech: Speech normal.        Behavior: Behavior normal. Behavior is cooperative.        Thought Content: Thought content normal.        Judgment: Judgment normal.     Results for orders placed or performed during the hospital encounter of 01/04/24  Surgical pathology   Collection Time: 01/04/24 12:00 AM  Result Value Ref Range   SURGICAL PATHOLOGY      SURGICAL PATHOLOGY [facility] [address] [place], [place] [address] Telephone  [phone] or [phone] Fax [phone]  REPORT OF SURGICAL PATHOLOGY   Accession #: [id number] Patient Name: Frances Davidson, Frances Davidson Visit # : [id number]  MRN: [id number] Physician: Luke Salaam DOB/Age 18-Feb-1961 (Age: 37) Gender: F Collected Date: 01/04/2024 Received Date: 01/04/2024  FINAL DIAGNOSIS       1. Colon, biopsy, cbx random :       - COLONIC MUCOSA, NO SIGNIFICANT ABNORMALITY.  NEGATIVE FOR INFLAMMATION,      DYSPLASIA OR MALIGNANCY.      - MICROSCOPIC COLITIS NOT IDENTIFIED.       DATE SIGNED OUT: 01/05/2024 ELECTRONIC SIGNATURE : [place] Md, Mark, Pathologist, Electronic Signature  MICROSCOPIC DESCRIPTION  CASE COMMENTS STAINS USED IN DIAGNOSIS: H&E    CLINICAL  HISTORY  SPECIMEN(S) OBTAINED 1. Colon, biopsy, Cbx Random  SPECIMEN COMMENTS: 1. r.o microscopic colitis SPECIMEN CLINICAL INFORMATION: 1. Normal colon,  diverticulosis    Gross Description 1. "Random colon CBXs, R/O microscopic colitis", received in formalin is a 1.5 x 0.6 x 0.1 cm aggregate of multiple tan-pink tissue fragments. The specimen is submitted in toto in 1 block (1A).      [facility] 01/04/2024        Report signed out from the following location(s) [place] [address] [place], [place] [address] CLIA #: [id number]  [facility] [address] [place], [place] [address] CLIA #: [id number]       Assessment & Plan:   Problem List Items Addressed This Visit       Other   Undifferentiated connective tissue disease (HCC)   Chronic.  Not well controlled.  Followed by Dermatology and Rheumatology.  On Dupixent  without improvement in symptoms.  Gabapentin  does help some with itching, will increase to 300mg  nightly.  Prednisone  given for 6 day taper.  Encourage patient to keep appointment with Rheumatology next month.  Encouraged alcohol cessation.  Reviewed recent Dermatology and Rheumatology notes.        Other Visit  Diagnoses       Annual physical exam    -  Primary   Health maintenance reviewed during visit today.  Labs ordered.  Vaccines reviewed.  Colonoscopy, Mammogram and PAP are up to date.   Relevant Orders   CBC with Differential/Platelet   Comprehensive metabolic panel with GFR   Lipid panel   TSH     Screening for ischemic heart disease       Relevant Orders   Lipid panel     Screening for HIV (human immunodeficiency virus)       Relevant Orders   HIV Antibody (routine testing w rflx)        Follow up plan: Return in about 3 months (around 05/11/2024) for Welcome to medicare.   LABORATORY TESTING:  - Pap smear: not applicable  IMMUNIZATIONS:   - Tdap: Tetanus vaccination status reviewed: Medicare. - Influenza: Postponed to flu season - Pneumovax: Up to date - Prevnar: Up to date - COVID: Not applicable - HPV: Not applicable - Shingrix vaccine: Discussed at visit today  SCREENING: -Mammogram: Up to date  - Colonoscopy: Up to date  - Bone Density: Will discuss at medicare wellness visit  -Hearing Test: Not applicable  -Spirometry: Not applicable   PATIENT COUNSELING:   Advised to take 1 mg of folate supplement per day if capable of pregnancy.   Sexuality: Discussed sexually transmitted diseases, partner selection, use of condoms, avoidance of unintended pregnancy  and contraceptive alternatives.   Advised to avoid cigarette smoking.  I discussed with the patient that most people either abstain from alcohol or drink within safe limits (<=14/week and <=4 drinks/occasion for males, <=7/weeks and <= 3 drinks/occasion for females) and that the risk for alcohol disorders and other health effects rises proportionally with the number of drinks per week and how often a drinker exceeds daily limits.  Discussed cessation/primary prevention of drug use and availability of treatment for abuse.   Diet: Encouraged to adjust caloric intake to maintain  or achieve ideal body weight, to  reduce intake of dietary saturated fat and total fat, to limit sodium intake by avoiding high sodium foods and not adding table salt, and to maintain adequate dietary potassium and calcium  preferably from fresh fruits,  vegetables, and low-fat dairy products.    stressed the importance of regular exercise  Injury prevention: Discussed safety belts, safety helmets, smoke detector, smoking near bedding or upholstery.   Dental health: Discussed importance of regular tooth brushing, flossing, and dental visits.    NEXT PREVENTATIVE PHYSICAL DUE IN 1 YEAR. Return in about 3 months (around 05/11/2024) for Welcome to medicare.

## 2024-02-09 NOTE — Assessment & Plan Note (Signed)
 Chronic.  Not well controlled.  Followed by Dermatology and Rheumatology.  On Dupixent  without improvement in symptoms.  Gabapentin  does help some with itching, will increase to 300mg  nightly.  Prednisone  given for 6 day taper.  Encourage patient to keep appointment with Rheumatology next month.  Encouraged alcohol cessation.  Reviewed recent Dermatology and Rheumatology notes.

## 2024-02-10 ENCOUNTER — Encounter: Payer: Self-pay | Admitting: Nurse Practitioner

## 2024-02-10 ENCOUNTER — Ambulatory Visit: Payer: Self-pay | Admitting: Nurse Practitioner

## 2024-02-10 LAB — COMPREHENSIVE METABOLIC PANEL WITH GFR
ALT: 32 IU/L (ref 0–32)
AST: 45 IU/L — ABNORMAL HIGH (ref 0–40)
Albumin: 4.3 g/dL (ref 3.9–4.9)
Alkaline Phosphatase: 95 IU/L (ref 44–121)
BUN/Creatinine Ratio: 11 — ABNORMAL LOW (ref 12–28)
BUN: 8 mg/dL (ref 8–27)
Bilirubin Total: 0.8 mg/dL (ref 0.0–1.2)
CO2: 21 mmol/L (ref 20–29)
Calcium: 9 mg/dL (ref 8.7–10.3)
Chloride: 102 mmol/L (ref 96–106)
Creatinine, Ser: 0.73 mg/dL (ref 0.57–1.00)
Globulin, Total: 1.9 g/dL (ref 1.5–4.5)
Glucose: 69 mg/dL — ABNORMAL LOW (ref 70–99)
Potassium: 4.1 mmol/L (ref 3.5–5.2)
Sodium: 139 mmol/L (ref 134–144)
Total Protein: 6.2 g/dL (ref 6.0–8.5)
eGFR: 91 mL/min/{1.73_m2} (ref >59)

## 2024-02-10 LAB — CBC WITH DIFFERENTIAL/PLATELET
Basophils Absolute: 0.1 10*3/uL (ref 0.0–0.2)
Basos: 1 %
EOS (ABSOLUTE): 0.4 10*3/uL (ref 0.0–0.4)
Eos: 5 %
Hematocrit: 36.6 % (ref 34.0–46.6)
Hemoglobin: 11.6 g/dL (ref 11.1–15.9)
Immature Grans (Abs): 0 10*3/uL (ref 0.0–0.1)
Immature Granulocytes: 0 %
Lymphocytes Absolute: 1.2 10*3/uL (ref 0.7–3.1)
Lymphs: 15 %
MCH: 30.7 pg (ref 26.6–33.0)
MCHC: 31.7 g/dL (ref 31.5–35.7)
MCV: 97 fL (ref 79–97)
Monocytes Absolute: 0.6 10*3/uL (ref 0.1–0.9)
Monocytes: 7 %
Neutrophils Absolute: 5.8 10*3/uL (ref 1.4–7.0)
Neutrophils: 72 %
Platelets: 211 10*3/uL (ref 150–450)
RBC: 3.78 x10E6/uL (ref 3.77–5.28)
RDW: 13.4 % (ref 11.7–15.4)
WBC: 8.1 10*3/uL (ref 3.4–10.8)

## 2024-02-10 LAB — LIPID PANEL
Chol/HDL Ratio: 1.7 ratio (ref 0.0–4.4)
Cholesterol, Total: 181 mg/dL (ref 100–199)
HDL: 105 mg/dL (ref >39)
LDL Chol Calc (NIH): 51 mg/dL (ref 0–99)
Triglycerides: 157 mg/dL — ABNORMAL HIGH (ref 0–149)
VLDL Cholesterol Cal: 25 mg/dL (ref 5–40)

## 2024-02-10 LAB — HIV ANTIBODY (ROUTINE TESTING W REFLEX): HIV Screen 4th Generation wRfx: NONREACTIVE

## 2024-02-10 LAB — TSH: TSH: 1.99 u[IU]/mL (ref 0.450–4.500)

## 2024-02-11 MED ORDER — HYDROXYZINE PAMOATE 25 MG PO CAPS: 25.0000 mg | ORAL_CAPSULE | Freq: Three times a day (TID) | ORAL | 1 refills | Status: DC | PRN

## 2024-02-17 ENCOUNTER — Encounter: Payer: Self-pay | Admitting: Obstetrics

## 2024-02-17 ENCOUNTER — Ambulatory Visit (INDEPENDENT_AMBULATORY_CARE_PROVIDER_SITE_OTHER): Admitting: Obstetrics

## 2024-02-17 VITALS — BP 123/77 | HR 107

## 2024-02-17 DIAGNOSIS — R159 Full incontinence of feces: Secondary | ICD-10-CM | POA: Insufficient documentation

## 2024-02-17 DIAGNOSIS — N3941 Urge incontinence: Secondary | ICD-10-CM | POA: Diagnosis not present

## 2024-02-17 DIAGNOSIS — N952 Postmenopausal atrophic vaginitis: Secondary | ICD-10-CM | POA: Diagnosis not present

## 2024-02-17 DIAGNOSIS — N8110 Cystocele, unspecified: Secondary | ICD-10-CM | POA: Insufficient documentation

## 2024-02-17 DIAGNOSIS — R3914 Feeling of incomplete bladder emptying: Secondary | ICD-10-CM | POA: Diagnosis not present

## 2024-02-17 DIAGNOSIS — R351 Nocturia: Secondary | ICD-10-CM | POA: Diagnosis not present

## 2024-02-17 LAB — POCT URINALYSIS DIP (CLINITEK)
Bilirubin, UA: NEGATIVE
Blood, UA: NEGATIVE
Glucose, UA: NEGATIVE mg/dL
Ketones, POC UA: NEGATIVE mg/dL
Leukocytes, UA: NEGATIVE
Nitrite, UA: NEGATIVE
POC PROTEIN,UA: NEGATIVE
Spec Grav, UA: 1.01 (ref 1.010–1.025)
Urobilinogen, UA: 0.2 U/dL
pH, UA: 6.5 (ref 5.0–8.0)

## 2024-02-17 MED ORDER — TROSPIUM CHLORIDE 20 MG PO TABS: 20.0000 mg | ORAL_TABLET | Freq: Two times a day (BID) | ORAL | 0 refills | Status: DC

## 2024-02-17 MED ORDER — GEMTESA 75 MG PO TABS
75.0000 mg | ORAL_TABLET | Freq: Every day | ORAL | Status: DC
Start: 2024-02-17 — End: 2024-03-12

## 2024-02-17 MED ORDER — ESTROGENS CONJUGATED 0.625 MG/GM VA CREA: 1.0000 | TOPICAL_CREAM | VAGINAL | 2 refills | Status: DC

## 2024-02-17 MED ORDER — GEMTESA 75 MG PO TABS
75.0000 mg | ORAL_TABLET | Freq: Every day | ORAL | 2 refills | Status: DC
Start: 2024-02-17 — End: 2024-05-17

## 2024-02-17 NOTE — Progress Notes (Addendum)
 New Patient Evaluation and Consultation  Referring Provider: Teresa Fender, MD PCP: Aileen Alexanders, NP Date of Service: 02/17/2024  SUBJECTIVE Chief Complaint: New Patient (Initial Visit) Frances Davidson is a 63 y.o. female here today for vaginal bulge and leakage)  History of Present Illness: Frances Davidson is a 63 y.o. Black or African-American female seen in consultation at the request of Dr Denman Fischer for evaluation of pelvic organ prolapse and urgency urinary incontinence.    Most bothered by urgency urinary incontinence that started 3 years ago when she was working and standing on her feet, denies vaginal bulge symptoms. Stopped premarin  vaginal cream after 2 days, pt reports instructed to use for vaginal discomfort. Tried size 2 ring with support pessary for grade II pelvic organ prolapse, expelled during straining with constipation after around 1.5 week.  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 Former tobacco use 0.25 pack years, chronic gastritis  Urinary Symptoms: Leaks urine with going from sitting to standing and with urgency Urgency 1 time(s) per week results in missing the toilet, mostly at night Denies leakage with coughing, sneezing Denies pad use Patient is bothered by UI symptoms.  Day time voids 10 started 3 years ago.  Nocturia: 3-4 times per night to void started this year. Drinks until bedtime Intermittent snoring Denies leg swelling Voiding dysfunction:  does not empty bladder well, performs Kegels  Patient does not use a catheter to empty bladder.  When urinating, patient feels a weak stream, difficulty starting urine stream, dribbling after finishing, the need to urinate multiple  times in a row, and to push on her belly or vagina to empty bladder Drinks: 32oz water per day, belching. 4 cans of beer started 5 years ago  UTIs: 0 UTI's in the last year.   Denies history of blood in urine, kidney or bladder stones, pyelonephritis, bladder cancer, and kidney cancer No results found for the last 90 days.   Pelvic Organ Prolapse Symptoms:                  Patient Denies a feeling of a bulge the vaginal area.  Bowel Symptom: Bowel movements: 1-3 time(s) per day, reports IBS-D started 3 years ago Stool consistency: loose Straining: yes.  Splinting: yes.  Incomplete evacuation: yes.  Patient Admits to accidental bowel leakage / fecal incontinence  Occurs: 1 time(s) per day  Consistency with leakage: liquid Bowel regimen: Imodium every 2 days Last colonoscopy: Results diverticulosis, negative biopsy for microscopic colitis  [facility] Colonoscopy          Upcoming     Colonoscopy (Every 10 Years) Next due on 01/03/2034    01/04/2024  COLONOSCOPY   Only the first 1 history entries have been loaded, but more history exists.                Sexual Function Sexually active: yes.  Sexual orientation: Straight Pain with sex: No  Pelvic Pain Denies pelvic pain  Past Medical History:  Past Medical History:  Diagnosis Date   Anemia    Anxiety    Arthritis    Blood transfusion without reported diagnosis    Cataract    Depression    GERD (gastroesophageal reflux disease)    Glaucoma    Hyperlipidemia  Osteoporosis    Ruptured appendix 07/2016   Sleep apnea    Undifferentiated connective tissue disease (HCC)      Past Surgical History:   Past Surgical History:  Procedure Laterality Date   APPENDECTOMY     BIOPSY OF SKIN SUBCUTANEOUS TISSUE AND/OR MUCOUS MEMBRANE  01/04/2024   Procedure: BIOPSY, SKIN, SUBCUTANEOUS TISSUE, OR MUCOUS MEMBRANE;  Surgeon: Luke Salaam, MD;  Location: [facility] ENDOSCOPY;  Service: Gastroenterology;;   BREAST EXCISIONAL BIOPSY  Left 1976   COLONOSCOPY N/A 01/04/2024   Procedure: COLONOSCOPY;  Surgeon: Luke Salaam, MD;  Location: [facility] ENDOSCOPY;  Service: Gastroenterology;  Laterality: N/A;   COLONOSCOPY WITH PROPOFOL  N/A 07/22/2021   Procedure: COLONOSCOPY WITH PROPOFOL ;  Surgeon: Irby Mannan, MD;  Location: [facility] ENDOSCOPY;  Service: Endoscopy;  Laterality: N/A;   ESOPHAGOGASTRODUODENOSCOPY N/A 07/22/2021   Procedure: ESOPHAGOGASTRODUODENOSCOPY (EGD);  Surgeon: Irby Mannan, MD;  Location: [facility] ENDOSCOPY;  Service: Endoscopy;  Laterality: N/A;   LAPAROSCOPIC APPENDECTOMY N/A 10/27/2016   Procedure: APPENDECTOMY LAPAROSCOPIC;  Surgeon: Emmalene Hare, MD;  Location: [facility] ORS;  Service: General;  Laterality: N/A;   TUBAL LIGATION  1985     Past OB/GYN History: OB History  Gravida Para Term Preterm AB Living  3 3 3   3   SAB IAB Ectopic Multiple Live Births      3    # Outcome Date GA Lbr Len/2nd Weight Sex Type Anes PTL Lv  3 Term      Vag-Spont     2 Term      Vag-Spont     1 Term      Vag-Spont       Vaginal deliveries: 3, largest 7lb 11oz Forceps/ Vacuum deliveries: 1 forceps, Cesarean section: 0 Menopausal: Yes, at age 17, Denies vaginal bleeding since menopause Contraception: s/p BTL and menopause. Last pap smear.  Any history of abnormal pap smears: no.    Component Value Date/Time   DIAGPAP  02/18/2021 1000    - Negative for intraepithelial lesion or malignancy (NILM)   HPVHIGH Negative 02/18/2021 1000   ADEQPAP  02/18/2021 1000    Satisfactory for evaluation; transformation zone component PRESENT.    Medications: Patient has a current medication list which includes the following prescription(s): atorvastatin , calcium -vitamin d, cetirizine , clobetasol  ointment, conjugated estrogens , dupixent , dupixent , gabapentin , hydrocortisone , hydroxychloroquine, hydroxyzine , multivitamin, omeprazole , trimo-san, zenpep , pimecrolimus , prednisone , triamcinolone  cream, trospium , gemtesa , and  gemtesa .   Allergies: Patient has no known allergies.   Social History:  Social History   Tobacco Use   Smoking status: Never   Smokeless tobacco: Never  Vaping Use   Vaping status: Never Used  Substance Use Topics   Alcohol use: Not Currently    Comment: occasional   Drug use: No    Relationship status: married Patient lives with her husband.   Patient is not employed. Regular exercise: No History of abuse: No  Family History:   Family History  Problem Relation Age of Onset   Diabetes Mother    Bladder Cancer Mother    Cervical cancer Mother    Thyroid cancer Mother    Lung cancer Father    Hypertension Father    Hypertension Brother    Hypertension Daughter    Hypertension Son    Diabetes Son    Cancer Maternal Grandmother    Diabetes Maternal Grandmother    Cancer Maternal Grandfather    Diabetes Maternal Grandfather    Cancer Paternal Grandmother    Diabetes Paternal Grandmother    Cancer Paternal  Grandfather    Diabetes Paternal Grandfather    Lymphoma Brother    Breast cancer Neg Hx      Review of Systems: Review of Systems  Constitutional:  Positive for weight loss. Negative for fever and malaise/fatigue.  Respiratory:  Negative for cough, shortness of breath and wheezing.   Cardiovascular:  Negative for chest pain, palpitations and leg swelling.  Gastrointestinal:  Negative for abdominal pain, blood in stool and constipation.       Leakage  Genitourinary:  Positive for frequency and urgency. Negative for dysuria and hematuria.       Leakage  Skin:  Negative for rash.  Neurological:  Positive for dizziness and weakness. Negative for headaches.  Endo/Heme/Allergies:  Bruises/bleeds easily.       Hot flashes  Psychiatric/Behavioral:  Negative for depression. The patient is not nervous/anxious.      OBJECTIVE Physical Exam: Vitals:   02/17/24 1304 02/17/24 1420  BP: (!) 87/72 123/77  Pulse: (!) 102 (!) 107    Physical Exam Constitutional:       General: She is not in acute distress.    Appearance: Normal appearance.  Genitourinary:     Bladder and urethral meatus normal.     No lesions in the vagina.     Right Labia: No rash, tenderness, lesions, skin changes or Bartholin's cyst.    Left Labia: No tenderness, lesions, skin changes, Bartholin's cyst or rash.    No vaginal discharge, erythema, tenderness, bleeding, ulceration or granulation tissue.     Anterior vaginal prolapse present.    Moderate vaginal atrophy present.     Right Adnexa: not tender, not full and no mass present.    Left Adnexa: not tender, not full and no mass present.    No cervical motion tenderness, discharge, friability, lesion, polyp or nabothian cyst.     Uterus is not enlarged, fixed, tender, irregular or prolapsed.     No uterine mass detected.    Urethral meatus caruncle not present.    No urethral prolapse, tenderness, mass, hypermobility, discharge or stress urinary incontinence with cough stress test present.     Bladder is not tender, urgency on palpation not present and masses not present.      Pelvic Floor: Levator muscle strength is 2/5.    Levator ani not tender, obturator internus not tender, no asymmetrical contractions present and no pelvic spasms present.    Pelvic Floor comments: Valsalva during attempts for Kegel exercise.    Symmetrical pelvic sensation, anal wink present and BC reflex present. Cardiovascular:     Rate and Rhythm: Normal rate.  Pulmonary:     Effort: Pulmonary effort is normal. No respiratory distress.  Abdominal:     General: There is no distension.     Palpations: Abdomen is soft. There is no mass.     Tenderness: There is no abdominal tenderness.     Hernia: No hernia is present.  Neurological:     Mental Status: She is alert.  Vitals reviewed. Exam conducted with a chaperone present.     POP-Q:   POP-Q  -1                                            Aa   -1  Ba  -5                                              C   2                                            Gh  4                                            Pb  6                                            tvl   -2                                            Ap  -2                                            Bp  -6                                              D    Post-Void Residual (PVR) by Bladder Scan: In order to evaluate bladder emptying, we discussed obtaining a postvoid residual and patient agreed to this procedure.  Procedure: The ultrasound unit was placed on the patient's abdomen in the suprapubic region after the patient had voided.    Post Void Residual - 02/17/24 1327       Post Void Residual   Post Void Residual 6 mL            Straight Catheterization Procedure for PVR: After verbal consent was obtained from the patient for catheterization to assess bladder emptying and residual volume the urethra and surrounding tissues were prepped with betadine and an in and out catheterization was performed.  PVR was .  Urine appeared clear yellow. The patient tolerated the procedure well.   Laboratory Results: Lab Results  Component Value Date   COLORU yellow 02/17/2024   CLARITYU clear 02/17/2024   GLUCOSEUR negative 02/17/2024   BILIRUBINUR negative 02/17/2024   KETONESU neg 02/24/2023   SPECGRAV 1.010 02/17/2024   RBCUR negative 02/17/2024   PHUR 6.5 02/17/2024   PROTEINUR Negative 02/24/2023   UROBILINOGEN 0.2 02/17/2024   LEUKOCYTESUR Negative 02/17/2024    Lab Results  Component Value Date   CREATININE 0.73 02/09/2024   CREATININE 0.84 08/09/2023   CREATININE 0.82 05/06/2023    Lab Results  Component Value Date   HGBA1C 4.9 05/06/2023    Lab Results  Component Value Date   HGB 11.6 02/09/2024     ASSESSMENT AND PLAN Frances Davidson is a 63 y.o. with:  1. Urge  incontinence of urine   2. Nocturia   3. Feeling of incomplete bladder  emptying   4. Vaginal atrophy   5. Pelvic organ prolapse quantification stage 2 cystocele   6. Incontinence of feces, unspecified fecal incontinence type     Urge incontinence of urine Assessment & Plan: - POCT UA negative, PVR 6mL - We discussed the symptoms of overactive bladder (OAB), which include urinary urgency, urinary frequency, nocturia, with or without urge incontinence.  While we do not know the exact etiology of OAB, several treatment options exist. We discussed management including behavioral therapy (decreasing bladder irritants, urge suppression strategies, timed voids, bladder retraining), physical therapy, medication; for refractory cases posterior tibial nerve stimulation, sacral neuromodulation, and intravesical botulinum toxin injection.  For anticholinergic medications, we discussed the potential side effects of anticholinergics including dry eyes, dry mouth, constipation, cognitive impairment and urinary retention. For Beta-3 agonist medication, we discussed the potential side effect of elevated blood pressure which is more likely to occur in individuals with uncontrolled hypertension. - trial of Gemtesa with samples provided - discussed Trospium if cost prohibitive, consider possible constipation as side effect to help reduce IBS-D symptoms - referral sent for pelvic floor PT - trial of low dose vaginal estrogen - reduce alcohol consumption  - Valsalva during attempt to perform Kegel exercise, advised pt to avoid performing when she has urge to void  Orders: -     POCT URINALYSIS DIP (CLINITEK) -     Gemtesa; Take 1 tablet (75 mg total) by mouth daily. LOT: [id number] EXP: 06/2026 Seferino Dade; Take 1 tablet (75 mg total) by mouth daily.  Dispense: 30 tablet; Refill: 2 -     Estrogens  Conjugated; Place 1 Applicatorful vaginally 2 (two) times a week. Place 0.5g nightly for two weeks then twice a week after  Dispense: 30 g; Refill: 2 -     AMB referral to  rehabilitation  Nocturia Assessment & Plan: - avoid fluid intake 3 hours before bedtime - reduce alcohol use - trial of gemtesa - reports snoring, consider sleep study for sleep apnea   Orders: -     Gemtesa; Take 1 tablet (75 mg total) by mouth daily. LOT: [id number] EXP: 06/2026 Seferino Dade; Take 1 tablet (75 mg total) by mouth daily.  Dispense: 30 tablet; Refill: 2  Feeling of incomplete bladder emptying Assessment & Plan: - PVR 6mL, catheterized for - repeat if clinical change - trial of Gemtesa to reassess symptoms - encouraged to consider pelvic floor PT  Orders: -     AMB referral to rehabilitation  Vaginal atrophy Assessment & Plan: - For symptomatic vaginal atrophy options include lubrication with a water-based lubricant, personal hygiene measures and barrier protection against wetness, and estrogen replacement in the form of vaginal cream, vaginal tablets, or a time-released vaginal ring.   - Rx low dose vaginal estrogen  Orders: -     Estrogens  Conjugated; Place 1 Applicatorful vaginally 2 (two) times a week. Place 0.5g nightly for two weeks then twice a week after  Dispense: 30 g; Refill: 2  Pelvic organ prolapse quantification stage 2 cystocele Assessment & Plan: - asymptomatic  - size 2 ring with support pessary with no change in urinary symptoms - For treatment of pelvic organ prolapse, we discussed options for management including expectant management, conservative management, and surgical management, such as Kegels, a pessary, pelvic floor physical therapy, and specific surgical procedures. - referral to pelvic floor PT  sent - consider repeat pessary trial to assess impact of prolapse repair on urinary symptoms  Orders: -     AMB referral to rehabilitation  Incontinence of feces, unspecified fecal incontinence type Assessment & Plan: - Treatment options include anti-diarrhea medication (loperamide/ Imodium OTC or prescription lomotil), fiber  supplements, physical therapy, and possible sacral neuromodulation or surgery.   - referral for pelvic floor PT - advised to reduce alcohol use - continue Imodium use with increased frequency - consider Trospium to assess anti-cholinergic effect on bowel consistency   Other orders -     Trospium Chloride; Take 1 tablet (20 mg total) by mouth 2 (two) times daily.  Dispense: 60 tablet; Refill: 0  Time spent: I spent 77 minutes dedicated to the care of this patient on the date of this encounter to include pre-visit review of records, face-to-face time with the patient discussing urgency urinary incontinence, nocturia, sensation of incomplete emptying, vaginal atrophy, stage II pelvic organ prolapse, fecal incontinence, and post visit documentation and ordering medication/ testing.   Darlene Ehlers, MD

## 2024-02-17 NOTE — Assessment & Plan Note (Signed)
-   PVR 6mL, catheterized for - repeat if clinical change - trial of Gemtesa to reassess symptoms - encouraged to consider pelvic floor PT

## 2024-02-17 NOTE — Assessment & Plan Note (Signed)
-   Treatment options include anti-diarrhea medication (loperamide/ Imodium OTC or prescription lomotil), fiber supplements, physical therapy, and possible sacral neuromodulation or surgery.   - referral for pelvic floor PT - advised to reduce alcohol use - continue Imodium use with increased frequency - consider Trospium to assess anti-cholinergic effect on bowel consistency

## 2024-02-17 NOTE — Assessment & Plan Note (Signed)
-   For symptomatic vaginal atrophy options include lubrication with a water-based lubricant, personal hygiene measures and barrier protection against wetness, and estrogen replacement in the form of vaginal cream, vaginal tablets, or a time-released vaginal ring.   - Rx low dose vaginal estrogen

## 2024-02-17 NOTE — Assessment & Plan Note (Addendum)
-   avoid fluid intake 3 hours before bedtime - reduce alcohol use - trial of gemtesa - reports snoring, consider sleep study for sleep apnea

## 2024-02-17 NOTE — Assessment & Plan Note (Addendum)
-   POCT UA negative, PVR 6mL - We discussed the symptoms of overactive bladder (OAB), which include urinary urgency, urinary frequency, nocturia, with or without urge incontinence.  While we do not know the exact etiology of OAB, several treatment options exist. We discussed management including behavioral therapy (decreasing bladder irritants, urge suppression strategies, timed voids, bladder retraining), physical therapy, medication; for refractory cases posterior tibial nerve stimulation, sacral neuromodulation, and intravesical botulinum toxin injection.  For anticholinergic medications, we discussed the potential side effects of anticholinergics including dry eyes, dry mouth, constipation, cognitive impairment and urinary retention. For Beta-3 agonist medication, we discussed the potential side effect of elevated blood pressure which is more likely to occur in individuals with uncontrolled hypertension. - trial of Gemtesa with samples provided - discussed Trospium if cost prohibitive, consider possible constipation as side effect to help reduce IBS-D symptoms - referral sent for pelvic floor PT - trial of low dose vaginal estrogen - reduce alcohol consumption  - Valsalva during attempt to perform Kegel exercise, advised pt to avoid performing when she has urge to void

## 2024-02-17 NOTE — Patient Instructions (Addendum)
 We discussed the symptoms of overactive bladder (OAB), which include urinary urgency, urinary frequency, night-time urination, with or without urge incontinence.  We discussed management including behavioral therapy (decreasing bladder irritants by following a bladder diet, urge suppression strategies, timed voids, bladder retraining), physical therapy, medication; and for refractory cases posterior tibial nerve stimulation, sacral neuromodulation, and intravesical botulinum toxin injection.   For Beta-3 agonist medication, we discussed the potential side effect of elevated blood pressure which is more likely to occur in individuals with uncontrolled hypertension. You were given samples for Gemtesa 75 mg.  It can take a month to start working so give it time, but if you have bothersome side effects call sooner and we can try a different medication.  Call us  if you have trouble filling the prescription or if it's not covered by your insurance.  Please do not fill your medications if they cost more than $30.   If gemtesa is not covered by insurance, switch to Trospium For anticholinergic medications, we discussed the potential side effects of anticholinergics including dry eyes, dry mouth, constipation, rare risks of cognitive impairment and urinary retention. You were given prescription for Trospium 20mg  twice a day.  It can take a month to start working so give it time, but if you have bothersome side effects call sooner and we can try a different medication.  Call us  if you have trouble filling the prescription or if it's not covered by your insurance.  For night time frequency: - avoid fluid intake 3 hours before bedtime - due to snoring, consider sleep study for sleep apnea  Reduce alcohol intake.   Accidental Bowel Leakage:  - Treatment options include anti-diarrhea medication (loperamide/ Imodium OTC or prescription lomotil), fiber supplements, physical therapy, and possible sacral  neuromodulation or surgery.     Start Kegel exercises.   Consider pelvic floor PT, please call if you would like a referral  For vaginal atrophy (thinning of the vaginal tissue that can cause dryness and burning) and UTI prevention we discussed estrogen replacement in the form of vaginal cream.   Start vaginal estrogen therapy nightly for two weeks then 2 times weekly at night. This can be placed with your finger or an applicator inside the vagina and around the urethra.  Please let us  know if the prescription is too expensive and we can look for alternative options.   Is vaginal estrogen therapy safe for me? Vaginal estrogen preparations act on the vaginal skin, and only a very tiny amount is absorbed into the bloodstream (0.01%).  They work in a similar way to hand or face cream.  There is minimal absorption and they are therefore perfectly safe. If you have had breast cancer and have persistent troublesome symptoms which aren't settling with vaginal moisturisers and lubricants, local estrogen treatment may be a possibility, but consultation with your oncologist should take place first.   You have a stage 2 (out of 4) prolapse.  We discussed the fact that it is not life threatening but there are several treatment options. For treatment of pelvic organ prolapse, we discussed options for management including expectant management, conservative management, and surgical management, such as Kegels, a pessary, pelvic floor physical therapy, and specific surgical procedures.

## 2024-02-17 NOTE — Assessment & Plan Note (Signed)
-   asymptomatic  - size 2 ring with support pessary with no change in urinary symptoms - For treatment of pelvic organ prolapse, we discussed options for management including expectant management, conservative management, and surgical management, such as Kegels, a pessary, pelvic floor physical therapy, and specific surgical procedures. - referral to pelvic floor PT sent - consider repeat pessary trial to assess impact of prolapse repair on urinary symptoms

## 2024-02-24 ENCOUNTER — Encounter: Payer: Self-pay | Admitting: Dermatology

## 2024-02-24 ENCOUNTER — Ambulatory Visit: Admitting: Dermatology

## 2024-02-24 DIAGNOSIS — L209 Atopic dermatitis, unspecified: Secondary | ICD-10-CM

## 2024-02-24 DIAGNOSIS — L281 Prurigo nodularis: Secondary | ICD-10-CM | POA: Diagnosis not present

## 2024-02-24 DIAGNOSIS — Z79899 Other long term (current) drug therapy: Secondary | ICD-10-CM

## 2024-02-24 DIAGNOSIS — Z7189 Other specified counseling: Secondary | ICD-10-CM

## 2024-02-24 MED ORDER — TRIAMCINOLONE ACETONIDE 0.1 % EX CREA
1.0000 | TOPICAL_CREAM | Freq: Two times a day (BID) | CUTANEOUS | 1 refills | Status: DC
Start: 2024-02-24 — End: 2024-05-11

## 2024-02-24 MED ORDER — CLOBETASOL PROPIONATE 0.05 % EX OINT
1.0000 | TOPICAL_OINTMENT | Freq: Two times a day (BID) | CUTANEOUS | 2 refills | Status: AC
Start: 2024-02-24 — End: ?

## 2024-02-24 MED ORDER — HYDROCORTISONE 2.5 % EX CREA: TOPICAL_CREAM | Freq: Two times a day (BID) | CUTANEOUS | 2 refills | Status: DC | PRN

## 2024-02-24 NOTE — Progress Notes (Signed)
 Follow-Up Visit   Subjective  Frances Davidson is a 63 y.o. female who presents for the following: Atopic Derm with Prurigo Nodularis, trunk, arms, legs, 98m f/u, restarted Dupixent  01/24/24, Clobetasol  oint onlyl last ~2 days, HC 2.5% cr for inframammary, groin, axilla ran out, no improvement since restarting Dupixent , itchy The patient has spots, moles and lesions to be evaluated, some may be new or changing and the patient may have concern these could be cancer.  Patient accompanied by husband  The following portions of the chart were reviewed this encounter and updated as appropriate: medications, allergies, medical history  Review of Systems:  No other skin or systemic complaints except as noted in HPI or Assessment and Plan.  Objective  Well appearing patient in no apparent distress; mood and affect are within normal limits.   A focused examination was performed of the following areas: Face, arms, hands  Relevant exam findings are noted in the Assessment and Plan.    Assessment & Plan   ATOPIC DERMATITIS with PRURIGO NODULARIS Severe failed pimecrolimus , clobetasol , triamcinolone   Restarted Dupixent  01/24/24 Exam: Scaly pink papules coalescing to plaques and nodules. Confluent on lower abdomen and upper back. Scattered on arms and legs with areas of confluence 50% BSA  Chronic and persistent condition with duration or expected duration over one year. Condition is symptomatic/ bothersome to patient. Not currently at goal.   Atopic dermatitis - Severe, on Dupixent  (biologic medication).  Atopic dermatitis (eczema) is a chronic, relapsing, pruritic condition that can significantly affect quality of life. It is often associated with allergic rhinitis and/or asthma and can require treatment with topical medications, phototherapy, or in severe cases biologic medications, which require long term medication management.    Treatment Plan: Cont Dupixent  300mg /49ml sq injections q  2 wks Start TMC 0.1% cr bid aa eczema until areas on smooth, avoid f/g/a Cont Clobetasol  oint bid until areas are smooth, avoid f/g/a Patient advised to use TMC 0.1% cr or Clobetasol  cr  Cont HC 2.5% cr bid to aa face, groin, axilla and inframammary until smooth, then prn flares  Discussed Rinvoq, may consider if not improving on Dupixent , information given. Discussed need for regular blood work, greater immunosuppression, and risk of cardiovascular events such as MI and CVA on Rinvoq  Patient may call to be overbooked if needing urgent follow up before next appointment  Dupilumab  (Dupixent ) is a treatment given by injection for adults and children with moderate-to-severe atopic dermatitis. Goal is control of skin condition, not cure. It is given as 2 injections at the first dose followed by 1 injection ever 2 weeks thereafter.  Young children are dosed monthly.  Potential side effects include allergic reaction, herpes infections, injection site reactions and conjunctivitis (inflammation of the eyes).  The use of Dupixent  requires long term medication management, including periodic office visits.  Topical steroids (such as triamcinolone , fluocinolone, fluocinonide, mometasone, clobetasol , halobetasol, betamethasone, hydrocortisone ) can cause thinning and lightening of the skin if they are used for too long in the same area. Your physician has selected the right strength medicine for your problem and area affected on the body. Please use your medication only as directed by your physician to prevent side effects.    Recommend gentle skin care.   ATOPIC DERMATITIS, UNSPECIFIED TYPE   LONG-TERM USE OF HIGH-RISK MEDICATION   COUNSELING AND COORDINATION OF CARE   MEDICATION MANAGEMENT    Return in about 2 months (around 04/25/2024) for Atopic Derm, advised pt to call if worsens and  ok to overbook.  I, Rollie Clipper, RMA, am acting as scribe for Harris Liming, MD .   Documentation: I  have reviewed the above documentation for accuracy and completeness, and I agree with the above.  Harris Liming, MD

## 2024-02-24 NOTE — Patient Instructions (Signed)

## 2024-03-11 ENCOUNTER — Emergency Department

## 2024-03-11 ENCOUNTER — Other Ambulatory Visit: Payer: Self-pay

## 2024-03-11 ENCOUNTER — Observation Stay
Admission: EM | Admit: 2024-03-11 | Discharge: 2024-03-12 | Disposition: A | Discharge status: Home / Self Care | Attending: Obstetrics and Gynecology | Admitting: Obstetrics and Gynecology

## 2024-03-11 DIAGNOSIS — L209 Atopic dermatitis, unspecified: Secondary | ICD-10-CM | POA: Diagnosis not present

## 2024-03-11 DIAGNOSIS — F1090 Alcohol use, unspecified, uncomplicated: Secondary | ICD-10-CM | POA: Diagnosis not present

## 2024-03-11 DIAGNOSIS — M359 Systemic involvement of connective tissue, unspecified: Secondary | ICD-10-CM | POA: Diagnosis present

## 2024-03-11 DIAGNOSIS — L0390 Cellulitis, unspecified: Secondary | ICD-10-CM | POA: Diagnosis present

## 2024-03-11 DIAGNOSIS — R6510 Systemic inflammatory response syndrome (SIRS) of non-infectious origin without acute organ dysfunction: Secondary | ICD-10-CM | POA: Diagnosis not present

## 2024-03-11 DIAGNOSIS — L03115 Cellulitis of right lower limb: Secondary | ICD-10-CM | POA: Diagnosis not present

## 2024-03-11 DIAGNOSIS — Z9189 Other specified personal risk factors, not elsewhere classified: Secondary | ICD-10-CM

## 2024-03-11 DIAGNOSIS — R600 Localized edema: Secondary | ICD-10-CM | POA: Diagnosis present

## 2024-03-11 DIAGNOSIS — L03116 Cellulitis of left lower limb: Secondary | ICD-10-CM | POA: Diagnosis not present

## 2024-03-11 LAB — CBC
HCT: 36.1 % (ref 36.0–46.0)
Hemoglobin: 11.8 g/dL — ABNORMAL LOW (ref 12.0–15.0)
MCH: 31.4 pg (ref 26.0–34.0)
MCHC: 32.7 g/dL (ref 30.0–36.0)
MCV: 96 fL (ref 80.0–100.0)
Platelets: 283 10*3/uL (ref 150–400)
RBC: 3.76 MIL/uL — ABNORMAL LOW (ref 3.87–5.11)
RDW: 13.9 % (ref 11.5–15.5)
WBC: 10.3 10*3/uL (ref 4.0–10.5)
nRBC: 0 % (ref 0.0–0.2)

## 2024-03-11 LAB — SEDIMENTATION RATE: Sed Rate: 23 mm/h (ref 0–30)

## 2024-03-11 LAB — LACTIC ACID, PLASMA
Lactic Acid, Venous: 1.3 mmol/L (ref 0.5–1.9)
Lactic Acid, Venous: 2.5 mmol/L (ref 0.5–1.9)
Lactic Acid, Venous: 1.3 mmol/L (ref 0.5–1.9)

## 2024-03-11 LAB — COMPREHENSIVE METABOLIC PANEL WITH GFR
ALT: 21 U/L (ref 0–44)
AST: 35 U/L (ref 15–41)
Albumin: 3.2 g/dL — ABNORMAL LOW (ref 3.5–5.0)
Alkaline Phosphatase: 69 U/L (ref 38–126)
Anion gap: 11 (ref 5–15)
BUN: <5 mg/dL — ABNORMAL LOW (ref 8–23)
CO2: 20 mmol/L — ABNORMAL LOW (ref 22–32)
Calcium: 8.3 mg/dL — ABNORMAL LOW (ref 8.9–10.3)
Chloride: 103 mmol/L (ref 98–111)
Creatinine, Ser: 0.97 mg/dL (ref 0.44–1.00)
GFR, Estimated: >60 mL/min (ref >60)
Glucose, Bld: 85 mg/dL (ref 70–99)
Potassium: 3.8 mmol/L (ref 3.5–5.1)
Sodium: 134 mmol/L — ABNORMAL LOW (ref 135–145)
Total Bilirubin: 0.8 mg/dL (ref 0.0–1.2)
Total Protein: 6.3 g/dL — ABNORMAL LOW (ref 6.5–8.1)

## 2024-03-11 LAB — D-DIMER, QUANTITATIVE: D-Dimer, Quant: 1.75 ug{FEU}/mL — ABNORMAL HIGH (ref 0.00–0.50)

## 2024-03-11 LAB — TROPONIN I (HIGH SENSITIVITY)
Troponin I (High Sensitivity): 4 ng/L (ref <18)
Troponin I (High Sensitivity): 5 ng/L (ref <18)

## 2024-03-11 LAB — BRAIN NATRIURETIC PEPTIDE: B Natriuretic Peptide: 48.5 pg/mL (ref 0.0–100.0)

## 2024-03-11 LAB — CK: Total CK: 158 U/L (ref 38–234)

## 2024-03-11 MED ORDER — ACETAMINOPHEN 650 MG RE SUPP: 650.0000 mg | Freq: Four times a day (QID) | RECTAL | Status: DC | PRN

## 2024-03-11 MED ORDER — MORPHINE SULFATE (PF) 4 MG/ML IV SOLN
4.0000 mg | Freq: Once | INTRAVENOUS | Status: AC
  Administered 2024-03-11: 4 mg via INTRAVENOUS
  Filled 2024-03-11: qty 1

## 2024-03-11 MED ORDER — LORAZEPAM 1 MG PO TABS: 1.0000 mg | ORAL_TABLET | ORAL | Status: DC | PRN

## 2024-03-11 MED ORDER — VANCOMYCIN HCL IN DEXTROSE 1-5 GM/200ML-% IV SOLN: 1000.0000 mg | INTRAVENOUS | Status: DC

## 2024-03-11 MED ORDER — SODIUM CHLORIDE 0.9 % IV SOLN
1.0000 g | INTRAVENOUS | Status: DC
  Filled 2024-03-11: qty 10

## 2024-03-11 MED ORDER — ONDANSETRON HCL 4 MG PO TABS
4.0000 mg | ORAL_TABLET | Freq: Four times a day (QID) | ORAL | Status: DC | PRN
Start: 2024-03-11 — End: 2024-03-12

## 2024-03-11 MED ORDER — KETOROLAC TROMETHAMINE 15 MG/ML IJ SOLN
15.0000 mg | Freq: Four times a day (QID) | INTRAMUSCULAR | Status: DC | PRN
Start: 2024-03-11 — End: 2024-03-16
  Administered 2024-03-11: 15 mg via INTRAVENOUS
  Filled 2024-03-11: qty 1

## 2024-03-11 MED ORDER — SODIUM CHLORIDE 0.9 % IV BOLUS
500.0000 mL | Freq: Once | INTRAVENOUS | Status: AC
  Administered 2024-03-11: 500 mL via INTRAVENOUS

## 2024-03-11 MED ORDER — LORAZEPAM 2 MG/ML IJ SOLN: 1.0000 mg | INTRAMUSCULAR | Status: DC | PRN

## 2024-03-11 MED ORDER — ENOXAPARIN SODIUM 40 MG/0.4ML IJ SOSY
40.0000 mg | PREFILLED_SYRINGE | INTRAMUSCULAR | Status: DC
  Administered 2024-03-11: 40 mg via SUBCUTANEOUS
  Filled 2024-03-11: qty 0.4

## 2024-03-11 MED ORDER — VANCOMYCIN HCL 1250 MG/250ML IV SOLN
1250.0000 mg | Freq: Once | INTRAVENOUS | Status: AC
  Administered 2024-03-11: 1250 mg via INTRAVENOUS
  Filled 2024-03-11 (×2): qty 250

## 2024-03-11 MED ORDER — SODIUM CHLORIDE 0.9 % IV SOLN
1.0000 g | Freq: Once | INTRAVENOUS | Status: AC
  Administered 2024-03-11: 1 g via INTRAVENOUS
  Filled 2024-03-11: qty 10

## 2024-03-11 MED ORDER — LACTATED RINGERS IV SOLN
150.0000 mL/h | INTRAVENOUS | Status: DC
  Administered 2024-03-11: 150 mL/h via INTRAVENOUS

## 2024-03-11 MED ORDER — GABAPENTIN 300 MG PO CAPS
300.0000 mg | ORAL_CAPSULE | Freq: Every day | ORAL | Status: DC
  Administered 2024-03-11: 300 mg via ORAL
  Filled 2024-03-11: qty 1

## 2024-03-11 MED ORDER — SODIUM CHLORIDE 0.9 % IV BOLUS
1000.0000 mL | Freq: Once | INTRAVENOUS | Status: AC
  Administered 2024-03-11: 1000 mL via INTRAVENOUS

## 2024-03-11 MED ORDER — HYDROCODONE-ACETAMINOPHEN 5-325 MG PO TABS: 1.0000 | ORAL_TABLET | ORAL | Status: DC | PRN

## 2024-03-11 MED ORDER — THIAMINE HCL 100 MG/ML IJ SOLN
100.0000 mg | Freq: Every day | INTRAMUSCULAR | Status: DC
  Administered 2024-03-11: 100 mg via INTRAVENOUS
  Filled 2024-03-11: qty 2

## 2024-03-11 MED ORDER — MORPHINE SULFATE (PF) 2 MG/ML IV SOLN: 2.0000 mg | INTRAVENOUS | Status: DC | PRN

## 2024-03-11 MED ORDER — ACETAMINOPHEN 325 MG PO TABS: 650.0000 mg | ORAL_TABLET | Freq: Four times a day (QID) | ORAL | Status: DC | PRN

## 2024-03-11 MED ORDER — IOHEXOL 350 MG/ML SOLN
75.0000 mL | Freq: Once | INTRAVENOUS | Status: AC | PRN
  Administered 2024-03-11: 75 mL via INTRAVENOUS

## 2024-03-11 MED ORDER — ADULT MULTIVITAMIN W/MINERALS CH
1.0000 | ORAL_TABLET | Freq: Every day | ORAL | Status: DC
  Administered 2024-03-11: 1 via ORAL
  Filled 2024-03-11: qty 1

## 2024-03-11 MED ORDER — THIAMINE MONONITRATE 100 MG PO TABS
100.0000 mg | ORAL_TABLET | Freq: Every day | ORAL | Status: DC
  Filled 2024-03-11: qty 1

## 2024-03-11 MED ORDER — FOLIC ACID 1 MG PO TABS
1.0000 mg | ORAL_TABLET | Freq: Every day | ORAL | Status: DC
  Administered 2024-03-11: 1 mg via ORAL
  Filled 2024-03-11: qty 1

## 2024-03-11 MED ORDER — ONDANSETRON HCL 4 MG/2ML IJ SOLN: 4.0000 mg | Freq: Four times a day (QID) | INTRAMUSCULAR | Status: DC | PRN

## 2024-03-11 MED ORDER — HYDROXYZINE PAMOATE 25 MG PO CAPS: 25.0000 mg | ORAL_CAPSULE | Freq: Three times a day (TID) | ORAL | Status: DC | PRN

## 2024-03-11 MED ORDER — HYDROXYZINE HCL 25 MG PO TABS
25.0000 mg | ORAL_TABLET | Freq: Three times a day (TID) | ORAL | Status: DC | PRN
  Administered 2024-03-11: 25 mg via ORAL
  Filled 2024-03-11 (×2): qty 1

## 2024-03-11 NOTE — Assessment & Plan Note (Signed)
 Sed rate normal. CRP slightly elevated but Acute flare not suspected Will hold Plaquenil Follow-up with rheumatologist at discharge

## 2024-03-11 NOTE — Consult Note (Signed)
 Pharmacy Antibiotic Note  ASSESSMENT: 63 y.o. female with PMH including bilateral LEE, atopic dermatitis, SIRS, appendiceal abscess is presenting with concerns for cellulitis. She endorses bilateral leg swelling for past several days. Patient is tachycardic but otherwise VSS. Pharmacy has been consulted to manage vancomycin  dosing. She is at upper end of her baseline renal function.  PLAN: Administer vancomycin  1250mg  IV x 1 as a loading dose, followed by vancomycin  1000mg  IV q24H thereafter eAUC 477, Cmax 33, Cmin 11 Scr 0.97, IBW, Vd 0.72 L/kg Follow up culture results to assess for antibiotic optimization. Monitor renal function to assess for any necessary antibiotic dosing changes.  Patient measurements: Height: 5' 5 (165.1 cm) Weight: 61.2 kg (135 lb) IBW/kg (Calculated) : 57  Vital signs: Temp: 98.3 F (36.8 C) (06/21 2116) Temp Source: Oral (06/21 1528) BP: 133/87 (06/21 2112) Pulse Rate: 112 (06/21 2112) Recent Labs  Lab 03/11/24 1616  WBC 10.3  CREATININE 0.97   Estimated Creatinine Clearance: 52 mL/min (by C-G formula based on SCr of 0.97 mg/dL).  Allergies: No Known Allergies  Antimicrobials this admission: Ceftriaxone  6/22 >>  Vancomycin  6/22 >>  Dose adjustments this admission: N/A  Microbiology results: 6/21 BCx: collected  Thank you for allowing pharmacy to be a part of this patient's care.  Will M. Lenon, PharmD Clinical Pharmacist 03/11/2024 9:17 PM

## 2024-03-11 NOTE — Assessment & Plan Note (Addendum)
 Likely secondary to infection/inflammatory reaction to new meds Venous ultrasound negative for DVT BNP was normal, low suspicion for CHF as patient has no shortness of breath, CT without evidence of vascular congestion Keep legs elevated

## 2024-03-11 NOTE — ED Triage Notes (Signed)
 To ED from home POV for bilateral LE swelling since 4 days. Denies SOB. Legs are red--skin red--pt states this is her baseline due to autoimmune issue. Sates urinating less than normal. Denies SOB and CP. HR is 145 in triage, EKG performed and will bring to room.

## 2024-03-11 NOTE — H&P (Signed)
 History and Physical    Patient: Frances Davidson FMW:[id number] DOB: [birth year] DOA: 03/11/2024 DOS: the patient was seen and examined on 03/11/2024 PCP: Melvin Pao, NP  Patient coming from: Home  Chief Complaint:  Chief Complaint  Patient presents with   Leg Swelling    HPI: Frances Davidson is a 63 y.o. female with medical history significant for Undifferentiated connective tissue disease since 2023, on Plaquenil (last seen by rheumatology 02/22/2024), severe atopic dermatitis with history of polymicrobial skin infection due to maceration, last seen by dermatology 02/24/2024, when she was started on Dupixent  and varied potency topical steroids, who presents to the ED with a 4-day history of lower extremity swelling .  She also complains of a burning pain of the lower extremities.  Dermatology note reviewed from 6/5 when patient was given instructions on using the creams(clobetasol  0.1%, triamcinolone  0.1% and hydrocortisone  2.5%).  She states that she used all the creams on her legs but with an week, the skin started flaking off and her legs started burning so she stopped using it.  About 4 days ago she started noticing swelling in her feet which she had never had before.  She denies fever or chills. In the ED initially tachycardic to 145 but afebrile and with otherwise normal vitals.  Labs notable for normal WBC of 10,000,Elevated lactic acid of 2.5 improving to 1.3 with hydration.  Troponin 5, BNP 48.5.  Sed rate 23, CK1 58.  D-dimer 1.75.  Hemoglobin at baseline of 11.8.  EKG with sinus tachycardia at 140 with no ischemic changes. CTA chest negative for PE and otherwise nonacute.  Venous ultrasound negative for DVT. Patient treated with NS bolus, Rocephin  and morphine .  Admission requested     Review of Systems: As mentioned in the history of present illness. All other systems reviewed and are negative.  Past Medical History:  Diagnosis Date   Anemia    Anxiety    Arthritis     Blood transfusion without reported diagnosis    Cataract    Depression    GERD (gastroesophageal reflux disease)    Glaucoma    Hyperlipidemia    Osteoporosis    Ruptured appendix 07/2016   Sleep apnea    Undifferentiated connective tissue disease (HCC)    Past Surgical History:  Procedure Laterality Date   APPENDECTOMY     BIOPSY OF SKIN SUBCUTANEOUS TISSUE AND/OR MUCOUS MEMBRANE  01/04/2024   Procedure: BIOPSY, SKIN, SUBCUTANEOUS TISSUE, OR MUCOUS MEMBRANE;  Surgeon: Therisa Bi, MD;  Location: [facility] ENDOSCOPY;  Service: Gastroenterology;;   BREAST EXCISIONAL BIOPSY Left 1976   COLONOSCOPY N/A 01/04/2024   Procedure: COLONOSCOPY;  Surgeon: Therisa Bi, MD;  Location: [facility] ENDOSCOPY;  Service: Gastroenterology;  Laterality: N/A;   COLONOSCOPY WITH PROPOFOL  N/A 07/22/2021   Procedure: COLONOSCOPY WITH PROPOFOL ;  Surgeon: Janalyn Keene NOVAK, MD;  Location: [facility] ENDOSCOPY;  Service: Endoscopy;  Laterality: N/A;   ESOPHAGOGASTRODUODENOSCOPY N/A 07/22/2021   Procedure: ESOPHAGOGASTRODUODENOSCOPY (EGD);  Surgeon: Janalyn Keene NOVAK, MD;  Location: [facility] ENDOSCOPY;  Service: Endoscopy;  Laterality: N/A;   LAPAROSCOPIC APPENDECTOMY N/A 10/27/2016   Procedure: APPENDECTOMY LAPAROSCOPIC;  Surgeon: Aloysius Plant, MD;  Location: [facility] ORS;  Service: General;  Laterality: N/A;   TUBAL LIGATION  1985   Social History:  reports that she has never smoked. She has never used smokeless tobacco. She reports that she does not currently use alcohol. She reports that she does not use drugs.  No Known Allergies  Family History  Problem Relation Age of Onset  Diabetes Mother    Bladder Cancer Mother    Cervical cancer Mother    Thyroid cancer Mother    Lung cancer Father    Hypertension Father    Hypertension Brother    Hypertension Daughter    Hypertension Son    Diabetes Son    Cancer Maternal Grandmother    Diabetes Maternal Grandmother    Cancer Maternal Grandfather    Diabetes Maternal  Grandfather    Cancer Paternal Grandmother    Diabetes Paternal Grandmother    Cancer Paternal Grandfather    Diabetes Paternal Grandfather    Lymphoma Brother    Breast cancer Neg Hx     Prior to Admission medications   Medication Sig Start Date End Date Taking? Authorizing Provider  atorvastatin  (LIPITOR) 20 MG tablet Take 1 tablet (20 mg total) by mouth daily. 08/09/23   Melvin Pao, NP  Calcium  Carbonate-Vitamin D (CALCIUM -VITAMIN D) 600-3.125 MG-MCG TABS Take 1 capsule by mouth daily.    [provider]  cetirizine  (ZYRTEC ) 10 MG tablet Take 1 tablet (10 mg total) by mouth daily. 08/09/23   Melvin Pao, NP  clobetasol  ointment (TEMOVATE ) 0.05 % Apply 1 Application topically 2 (two) times daily. To affected areas body until smooth. Avoid applying to face, groin, and axilla. Use as directed. Long-term use can cause thinning of the skin. 02/24/24   Claudene Lehmann, MD  conjugated estrogens  (PREMARIN ) vaginal cream Place 1 Applicatorful vaginally 2 (two) times a week. Place 0.5g nightly for two weeks then twice a week after 02/17/24   Guadlupe Dull T, MD  Dupilumab  (DUPIXENT ) 300 MG/2ML SOAJ Inject 300 mg into the skin every 14 (fourteen) days. Starting at day 15 for maintenance. 09/20/23   Claudene Lehmann, MD  Dupilumab  (DUPIXENT ) 300 MG/2ML SOAJ Inject 300 mg into the skin every 14 (fourteen) days. Starting at day 15 for maintenance. 01/24/24   Claudene Lehmann, MD  gabapentin  (NEURONTIN ) 300 MG capsule Take 1 capsule (300 mg total) by mouth at bedtime. 02/09/24   Melvin Pao, NP  hydrocortisone  2.5 % cream Apply topically 2 (two) times daily as needed (Rash). Bid to aa eczem until smooth on face, axilla, groin, inframammary 02/24/24   Claudene Lehmann, MD  hydroxychloroquine (PLAQUENIL) 200 MG tablet TAKE 1 TABLET BY MOUTH TWICE A DAY ON TUESDAY, THURSDAY, SATURDAY, SUNDAY AND TAKE 1 TABLET ON ALL OTHER DAYS .TAKE WITH FOOD 01/04/23   [provider]   hydrOXYzine  (VISTARIL ) 25 MG capsule Take 1 capsule (25 mg total) by mouth every 8 (eight) hours as needed. 02/11/24   Melvin Pao, NP  Multiple Vitamin (MULTIVITAMIN) capsule Take 1 capsule by mouth daily.    [provider]  omeprazole  (PRILOSEC  OTC) 20 MG tablet Take 1 tablet (20 mg total) by mouth daily. 08/09/23   Melvin Pao, NP  OXYQUINOLONE SULFATE VAGINAL (TRIMO-SAN) 0.025 % GEL Place 1 Applicatorful vaginally once a week. 06/09/23   Connell Davies, MD  Pancrelipase , Lip-Prot-Amyl, (ZENPEP ) 40000-126000 units CPEP 2 capsules with every meal and 1 capsule with snack 10/26/23   Therisa Bi, MD  pimecrolimus  (ELIDEL ) 1 % cream APPLY TO THE AFFECTED AREA(S) 2 TIMES A DAY 11/24/23   Claudene Lehmann, MD  predniSONE  (DELTASONE ) 10 MG tablet Take 1 tablet (10 mg total) by mouth daily with breakfast. Take 6 today, 5 tomorrow, and decrease by 1 each day until course is complete. 02/09/24   Melvin Pao, NP  triamcinolone  cream (KENALOG ) 0.1 % Apply 1 Application topically 2 (two) times daily. Bid  to aa eczema on body until smooth, avoid using on face, groin, axilla 02/24/24   Claudene Lehmann, MD  trospium  (SANCTURA ) 20 MG tablet Take 1 tablet (20 mg total) by mouth 2 (two) times daily. 02/17/24   Guadlupe Lianne DASEN, MD  Vibegron  (GEMTESA ) 75 MG TABS Take 1 tablet (75 mg total) by mouth daily. LOT: [id number] EXP: 06/2026 02/17/24   Guadlupe Lianne T, MD  Vibegron  (GEMTESA ) 75 MG TABS Take 1 tablet (75 mg total) by mouth daily. 02/17/24   Guadlupe Lianne DASEN, MD    Physical Exam: Vitals:   03/11/24 1730 03/11/24 1800 03/11/24 1830 03/11/24 1900  BP: 119/82 (!) 134/91 115/73 112/78  Pulse: (!) 110 (!) 124 (!) 116 (!) 110  Resp:      Temp:      TempSrc:      SpO2: 100% 99% 100% 100%  Weight:      Height:       Physical Exam Vitals and nursing note reviewed.  Constitutional:      General: She is not in acute distress. HENT:     Head: Normocephalic and atraumatic.   Cardiovascular:     Rate  and Rhythm: Normal rate and regular rhythm.     Heart sounds: Normal heart sounds.  Pulmonary:     Effort: Pulmonary effort is normal.     Breath sounds: Normal breath sounds.  Abdominal:     Palpations: Abdomen is soft.     Tenderness: There is no abdominal tenderness.   Skin:    Comments: Chronic appearing rash on extremities, trunk, neck and face with minimal flaking, multiple areas of hypopigmentation.  Redness noted to legs, with mild swelling of feet.  See pic   Neurological:     Mental Status: Mental status is at baseline.     Labs on Admission: I have personally reviewed following labs and imaging studies  CBC: Recent Labs  Lab 03/11/24 1616  WBC 10.3  HGB 11.8*  HCT 36.1  MCV 96.0  PLT 283   Basic Metabolic Panel: Recent Labs  Lab 03/11/24 1616  NA 134*  K 3.8  CL 103  CO2 20*  GLUCOSE 85  BUN <5*  CREATININE 0.97  CALCIUM  8.3*   GFR: Estimated Creatinine Clearance: 52 mL/min (by C-G formula based on SCr of 0.97 mg/dL). Liver Function Tests: Recent Labs  Lab 03/11/24 1616  AST 35  ALT 21  ALKPHOS 69  BILITOT 0.8  PROT 6.3*  ALBUMIN 3.2*   No results for input(s): LIPASE, AMYLASE in the last 168 hours. No results for input(s): AMMONIA in the last 168 hours. Coagulation Profile: No results for input(s): INR, PROTIME in the last 168 hours. Cardiac Enzymes: Recent Labs  Lab 03/11/24 1616  CKTOTAL 158   BNP (last 3 results) No results for input(s): PROBNP in the last 8760 hours. HbA1C: No results for input(s): HGBA1C in the last 72 hours. CBG: No results for input(s): GLUCAP in the last 168 hours. Lipid Profile: No results for input(s): CHOL, HDL, LDLCALC, TRIG, CHOLHDL, LDLDIRECT in the last 72 hours. Thyroid Function Tests: No results for input(s): TSH, T4TOTAL, FREET4, T3FREE, THYROIDAB in the last 72 hours. Anemia Panel: No results for input(s): VITAMINB12, FOLATE, FERRITIN, TIBC, IRON,  RETICCTPCT in the last 72 hours. Urine analysis:    Component Value Date/Time   COLORURINE YELLOW (A) 12/28/2022 0950   APPEARANCEUR CLEAR (A) 12/28/2022 0950   APPEARANCEUR Clear 03/10/2021 1106   LABSPEC 1.014 12/28/2022 0950   PHURINE  5.0 12/28/2022 0950   GLUCOSEU NEGATIVE 12/28/2022 0950   HGBUR NEGATIVE 12/28/2022 0950   BILIRUBINUR negative 02/17/2024 1315   BILIRUBINUR neg 02/24/2023 0935   BILIRUBINUR Negative 03/10/2021 1106   KETONESUR negative 02/17/2024 1315   KETONESUR NEGATIVE 12/28/2022 0950   PROTEINUR Negative 02/24/2023 0935   PROTEINUR NEGATIVE 12/28/2022 0950   UROBILINOGEN 0.2 02/17/2024 1315   NITRITE Negative 02/17/2024 1315   NITRITE neg 02/24/2023 0935   NITRITE NEGATIVE 12/28/2022 0950   LEUKOCYTESUR Negative 02/17/2024 1315   LEUKOCYTESUR NEGATIVE 12/28/2022 0950    Radiological Exams on Admission: US  Venous Img Lower Bilateral Result Date: 03/11/2024 CLINICAL DATA:  Leg pain and swelling for several days EXAM: BILATERAL LOWER EXTREMITY VENOUS DOPPLER ULTRASOUND TECHNIQUE: Gray-scale sonography with graded compression, as well as color Doppler and duplex ultrasound were performed to evaluate the lower extremity deep venous systems from the level of the common femoral vein and including the common femoral, femoral, profunda femoral, popliteal and calf veins including the posterior tibial, peroneal and gastrocnemius veins when visible. The superficial great saphenous vein was also interrogated. Spectral Doppler was utilized to evaluate flow at rest and with distal augmentation maneuvers in the common femoral, femoral and popliteal veins. COMPARISON:  None Available. FINDINGS: RIGHT LOWER EXTREMITY Common Femoral Vein: No evidence of thrombus. Normal compressibility, respiratory phasicity and response to augmentation. Saphenofemoral Junction: No evidence of thrombus. Normal compressibility and flow on color Doppler imaging. Profunda Femoral Vein: No evidence of  thrombus. Normal compressibility and flow on color Doppler imaging. Femoral Vein: No evidence of thrombus. Normal compressibility, respiratory phasicity and response to augmentation. Popliteal Vein: No evidence of thrombus. Normal compressibility, respiratory phasicity and response to augmentation. Calf Veins: No evidence of thrombus. Normal compressibility and flow on color Doppler imaging. Superficial Great Saphenous Vein: No evidence of thrombus. Normal compressibility and flow on color Doppler imaging. Venous Reflux:  None. Other Findings:  None. LEFT LOWER EXTREMITY Common Femoral Vein: No evidence of thrombus. Normal compressibility, respiratory phasicity and response to augmentation. Saphenofemoral Junction: No evidence of thrombus. Normal compressibility and flow on color Doppler imaging. Profunda Femoral Vein: No evidence of thrombus. Normal compressibility and flow on color Doppler imaging. Femoral Vein: No evidence of thrombus. Normal compressibility, respiratory phasicity and response to augmentation. Popliteal Vein: No evidence of thrombus. Normal compressibility, respiratory phasicity and response to augmentation. Calf Veins: No evidence of thrombus. Normal compressibility and flow on color Doppler imaging. Superficial Great Saphenous Vein: No evidence of thrombus. Normal compressibility and flow on color Doppler imaging. Venous Reflux:  None. Other Findings:  None. IMPRESSION: No evidence of deep venous thrombosis. Electronically Signed   By: Oneil Devonshire M.D.   On: 03/11/2024 18:28   CT Angio Chest PE W/Cm &/Or Wo Cm Result Date: 03/11/2024 CLINICAL DATA:  Positive D-dimer and lower extremity swelling, initial encounter EXAM: CT ANGIOGRAPHY CHEST WITH CONTRAST TECHNIQUE: Multidetector CT imaging of the chest was performed using the standard protocol during bolus administration of intravenous contrast. Multiplanar CT image reconstructions and MIPs were obtained to evaluate the vascular anatomy.  RADIATION DOSE REDUCTION: This exam was performed according to the departmental dose-optimization program which includes automated exposure control, adjustment of the mA and/or kV according to patient size and/or use of iterative reconstruction technique. CONTRAST:  75mL OMNIPAQUE  IOHEXOL  350 MG/ML SOLN COMPARISON:  None Available. FINDINGS: Cardiovascular: Thoracic aorta its branches are within normal limits. No aneurysmal dilatation or dissection is noted. The pulmonary artery shows a normal branching pattern bilaterally. No filling defect  to suggest pulmonary embolism is noted. No significant coronary calcifications are seen. Mediastinum/Nodes: Thoracic inlet is within normal limits. No hilar or mediastinal adenopathy is noted. The esophagus as visualized is within normal limits. Lungs/Pleura: The lungs are well aerated bilaterally. No focal infiltrate or effusion is seen. No sizable parenchymal nodules are noted. Upper Abdomen: Visualized upper abdomen is unremarkable. Musculoskeletal: No acute rib abnormality is noted. Mild degenerative changes of the thoracic spine are seen. Review of the MIP images confirms the above findings. IMPRESSION: No evidence of pulmonary emboli. Electronically Signed   By: Oneil Devonshire M.D.   On: 03/11/2024 18:28   Data Reviewed for HPI: Relevant notes from primary care and specialist visits, past discharge summaries as available in EHR, including Care Everywhere. Prior diagnostic testing as pertinent to current admission diagnoses Updated medications and problem lists for reconciliation ED course, including vitals, labs, imaging, treatment and response to treatment Triage notes, nursing and pharmacy notes and ED provider's notes Notable results as noted above in HPI      Assessment and Plan: * Cellulitis of both lower extremities Allergic/inflammatory contact dermatitis versus secondary skin infection Severe atopic dermatitis  At high risk of severe infection due  to chronic immunosuppressive therapy SIRS, possible sepsis SIRS criteria include tachycardia and infection, elevated lactic acid Sed rate normal so acute flare of autoimmune disease not suspected at this time Hold immunosuppressive therapy: Plaquenil, ,Dupixent , topical steroids Rocephin  and vancomycin  IV Sepsis fluids Follow blood culture Pain management Follow-up with dermatologist and rheumatologist at discharge(last seen 6/5 and 02/22/2024 respectively)  Bilateral lower extremity edema Likely secondary to infection/inflammatory reaction to new meds Venous ultrasound negative for DVT BNP was normal, low suspicion for CHF as patient has no shortness of breath, CT without evidence of vascular congestion Keep legs elevated   Undifferentiated connective tissue disease (HCC) Sed rate normal. CRP slightly elevated but Acute flare not suspected Will hold Plaquenil Follow-up with rheumatologist at discharge  Alcohol use disorder Has 2 drinks daily CIWA withdrawal protocol     DVT prophylaxis: Lovenox   Consults: none  Advance Care Planning:   Code Status: Prior   Family Communication: none  Disposition Plan: Back to previous home environment  Severity of Illness: The appropriate patient status for this patient is INPATIENT. Inpatient status is judged to be reasonable and necessary in order to provide the required intensity of service to ensure the patient's safety. The patient's presenting symptoms, physical exam findings, and initial radiographic and laboratory data in the context of their chronic comorbidities is felt to place them at high risk for further clinical deterioration. Furthermore, it is not anticipated that the patient will be medically stable for discharge from the hospital within 2 midnights of admission.   * I certify that at the point of admission it is my clinical judgment that the patient will require inpatient hospital care spanning beyond 2 midnights from the  point of admission due to high intensity of service, high risk for further deterioration and high frequency of surveillance required.*  Author: Delayne LULLA Solian, MD 03/11/2024 8:58 PM  For on call review [email or web address].

## 2024-03-11 NOTE — Assessment & Plan Note (Addendum)
 Severe atopic dermatitis with suspected secondary bacterial infection versus acute inflammatory reaction At high risk of severe infection due to chronic immunosuppressive therapy SIRS, possible sepsis SIRS criteria include tachycardia and infection, elevated lactic acid Sed rate normal so acute flare of autoimmune disease not suspected at this time Hold immunosuppressive therapy: Plaquenil, and recently started Dupixent  on topical steroids Rocephin  and vancomycin  IV Sepsis fluids Pain management Follow-up with dermatologist and rheumatologist at discharge(last seen 6/5 and 02/22/2024 respectively)

## 2024-03-11 NOTE — Assessment & Plan Note (Signed)
 Has 2 drinks daily CIWA withdrawal protocol

## 2024-03-11 NOTE — ED Provider Notes (Signed)
 [facility] Provider Note    Event Date/Time   First MD Initiated Contact with Patient 03/11/24 1539     (approximate)   History   Leg Swelling   HPI  Frances Davidson is a 63 y.o. female past medical history significant for undifferentiated connective tissue disease, alcohol use disorder, atopic dermatitis, followed by dermatology, presents to the emergency department with leg swelling.  States that she has had significant swelling to both of her legs over the past 4 days.  Complaining of pain to her ankles.  States that she came in because she was having some heart palpitations.  Denies any chest pain or shortness of breath.  Denies nausea or vomiting.  Denies blood in her stool.  Does endorse frequent diarrhea and loose stool from IBS.  Does drink a significant amount of alcohol and states that she drinks about 3 beers on a daily basis.  Drink 1 beer approximately 1 hour ago.  Denies any blood in her stool or melena.  Denies history of cirrhosis.  No history of an upper GI bleed.  States that she does not drink much water because this makes her have diarrhea.  States that she said did not creased oral intake.  Followed by rheumatology and dermatology.  States that her whole body hurts from diffuse rash and peeling in her hands and feet.  No recent falls or trauma.  No history of PE or DVT.  On chart review patient is followed as an outpatient for undifferentiated connective tissue disease.  She has been followed by rheumatology and has been on multiple medications in the past including prednisone , Plaquenil and gabapentin .  Currently treated with Plaquenil.     Physical Exam   Triage Vital Signs: ED Triage Vitals  Encounter Vitals Group     BP 03/11/24 1528 103/85     Girls Systolic BP Percentile --      Girls Diastolic BP Percentile --      Boys Systolic BP Percentile --      Boys Diastolic BP Percentile --      Pulse Rate 03/11/24 1528 (!) 145     Resp  03/11/24 1528 20     Temp 03/11/24 1528 98.7 F (37.1 C)     Temp Source 03/11/24 1528 Oral     SpO2 03/11/24 1528 100 %     Weight 03/11/24 1529 135 lb (61.2 kg)     Height 03/11/24 1529 5' 5 (1.651 m)     Head Circumference --      Peak Flow --      Pain Score 03/11/24 1526 8     Pain Loc --      Pain Education --      Exclude from Growth Chart --     Most recent vital signs: Vitals:   03/11/24 1830 03/11/24 1900  BP: 115/73 112/78  Pulse: (!) 116 (!) 110  Resp:    Temp:    SpO2: 100% 100%    Physical Exam Constitutional:      Appearance: She is well-developed.  HENT:     Head: Atraumatic.     Mouth/Throat:     Mouth: Mucous membranes are dry.   Eyes:     Extraocular Movements: Extraocular movements intact.     Conjunctiva/sclera: Conjunctivae normal.     Pupils: Pupils are equal, round, and reactive to light.    Cardiovascular:     Rate and Rhythm: Regular rhythm. Tachycardia present.  Pulmonary:  Effort: No respiratory distress.  Abdominal:     General: There is no distension.     Tenderness: There is no abdominal tenderness.   Musculoskeletal:        General: Normal range of motion.     Cervical back: Normal range of motion.     Right lower leg: Edema present.     Left lower leg: Edema present.   Skin:    General: Skin is warm.     Capillary Refill: Capillary refill takes less than 2 seconds.     Findings: Rash present.     Comments: Erythematous rash to the entire body.  Desquamation of the skin to bilateral hands, elbows and feet.  Significant swelling to bilateral ankles and lower extremities.  Worsening swelling to the right ankle.  +2 DP and radial pulses that are equal bilaterally.   Neurological:     Mental Status: She is alert. Mental status is at baseline.     IMPRESSION / MDM / ASSESSMENT AND PLAN / ED COURSE  I reviewed the triage vital signs and the nursing notes.  Patient presents to the emergency department for leg swelling and  a rash.  On arrival patient was afebrile but tachycardic to 145.  EKG with sinus tachycardia.  Afebrile with soft blood pressure of 103/85.  Differential diagnosis including dehydration, diffuse atopic dermatitis, DVT, heart failure, cellulitis, GIB, anemia.   EKG  I, Clotilda Punter, the attending physician, personally viewed and interpreted this ECG.  EKG showed sinus tachycardia with heart rate of 148.  Narrow complex.  Appears regular.  QTc 433.  No significant ST elevation or depression.  No findings of acute ischemia.  No prior EKG to compare.  Sinus tachycardia while on cardiac telemetry.  RADIOLOGY I independently reviewed imaging, my interpretation of imaging: CT without obvious pulmonary embolism and no signs of pneumonia on my exam.  CTA was read as no acute findings.  No findings of DVT on lower extremity ultrasound.  LABS (all labs ordered are listed, but only abnormal results are displayed) Labs interpreted as -    Labs Reviewed  CBC - Abnormal; Notable for the following components:      Result Value   RBC 3.76 (*)    Hemoglobin 11.8 (*)    All other components within normal limits  LACTIC ACID, PLASMA - Abnormal; Notable for the following components:   Lactic Acid, Venous 2.5 (*)    All other components within normal limits  COMPREHENSIVE METABOLIC PANEL WITH GFR - Abnormal; Notable for the following components:   Sodium 134 (*)    CO2 20 (*)    BUN <5 (*)    Calcium  8.3 (*)    Total Protein 6.3 (*)    Albumin 3.2 (*)    All other components within normal limits  D-DIMER, QUANTITATIVE - Abnormal; Notable for the following components:   D-Dimer, Quant 1.75 (*)    All other components within normal limits  CULTURE, BLOOD (ROUTINE X 2)  CULTURE, BLOOD (ROUTINE X 2)  SEDIMENTATION RATE  CK  LACTIC ACID, PLASMA  BRAIN NATRIURETIC PEPTIDE  C-REACTIVE PROTEIN  LACTIC ACID, PLASMA  TROPONIN I (HIGH SENSITIVITY)  TROPONIN I (HIGH SENSITIVITY)      MDM   Clinical Course as of 03/11/24 2042  Sat Mar 11, 2024  1657 Age-adjusted D-dimer is elevated at 1.75.  Will obtain CTA to further evaluate for pulmonary embolism. [SM]  1739 Lactic acid resulted at 2.5.  Continues to be tachycardic.  No leukocytosis.  Given concern for possible superimposed cellulitis will give 1 dose of IV Rocephin .  CTA on my evaluation without an obvious pulmonary embolism or pneumonia. [SM]    Clinical Course User Index [SM] Suzanne Kirsch, MD   No findings of pulmonary embolism or pneumonia.  Patient continues to be persistently tachycardic with a heart rate of 110-116 despite 1 L of IV fluids.  Does appear dehydrated so we will give another 500 bolus.  Possible cellulitis versus rheumatologic.  Have a low suspicion for septic joint.  Will consult hospitalist for admission.  PROCEDURES:  Critical Care performed:   Procedures  Patient's presentation is most consistent with acute presentation with potential threat to life or bodily function.   MEDICATIONS ORDERED IN ED: Medications  sodium chloride  0.9 % bolus 1,000 mL (0 mLs Intravenous Stopped 03/11/24 1739)  morphine  (PF) 4 MG/ML injection 4 mg (4 mg Intravenous Given 03/11/24 1646)  iohexol  (OMNIPAQUE ) 350 MG/ML injection 75 mL (75 mLs Intravenous Contrast Given 03/11/24 1707)  cefTRIAXone  (ROCEPHIN ) 1 g in sodium chloride  0.9 % 100 mL IVPB (0 g Intravenous Stopped 03/11/24 1832)  sodium chloride  0.9 % bolus 500 mL (500 mLs Intravenous New Bag/Given 03/11/24 2019)    FINAL CLINICAL IMPRESSION(S) / ED DIAGNOSES   Final diagnoses:  Leg edema     Rx / DC Orders   ED Discharge Orders     None        Note:  This document was prepared using Dragon voice recognition software and may include unintentional dictation errors.   Suzanne Kirsch, MD 03/11/24 2042

## 2024-03-12 DIAGNOSIS — L03116 Cellulitis of left lower limb: Secondary | ICD-10-CM | POA: Diagnosis not present

## 2024-03-12 DIAGNOSIS — L03115 Cellulitis of right lower limb: Secondary | ICD-10-CM | POA: Diagnosis not present

## 2024-03-12 DIAGNOSIS — L0390 Cellulitis, unspecified: Secondary | ICD-10-CM | POA: Diagnosis present

## 2024-03-12 LAB — CBC
HCT: 29.6 % — ABNORMAL LOW (ref 36.0–46.0)
Hemoglobin: 9.7 g/dL — ABNORMAL LOW (ref 12.0–15.0)
MCH: 30.7 pg (ref 26.0–34.0)
MCHC: 32.8 g/dL (ref 30.0–36.0)
MCV: 93.7 fL (ref 80.0–100.0)
Platelets: 246 10*3/uL (ref 150–400)
RBC: 3.16 MIL/uL — ABNORMAL LOW (ref 3.87–5.11)
RDW: 13.7 % (ref 11.5–15.5)
WBC: 8.1 10*3/uL (ref 4.0–10.5)
nRBC: 0 % (ref 0.0–0.2)

## 2024-03-12 LAB — BASIC METABOLIC PANEL WITH GFR
Anion gap: 6 (ref 5–15)
BUN: <5 mg/dL — ABNORMAL LOW (ref 8–23)
CO2: 22 mmol/L (ref 22–32)
Calcium: 7.6 mg/dL — ABNORMAL LOW (ref 8.9–10.3)
Chloride: 109 mmol/L (ref 98–111)
Creatinine, Ser: 0.75 mg/dL (ref 0.44–1.00)
GFR, Estimated: >60 mL/min (ref >60)
Glucose, Bld: 87 mg/dL (ref 70–99)
Potassium: 3.8 mmol/L (ref 3.5–5.1)
Sodium: 137 mmol/L (ref 135–145)

## 2024-03-12 LAB — C-REACTIVE PROTEIN: CRP: 1.7 mg/dL — ABNORMAL HIGH (ref <1.0)

## 2024-03-12 MED ORDER — VANCOMYCIN HCL 1250 MG/250ML IV SOLN
1250.0000 mg | INTRAVENOUS | Status: DC
  Filled 2024-03-12: qty 250

## 2024-03-12 MED ORDER — CEPHALEXIN 250 MG PO CAPS: 250.0000 mg | ORAL_CAPSULE | Freq: Four times a day (QID) | ORAL | 0 refills | Status: AC

## 2024-03-12 NOTE — Discharge Summary (Signed)
 Frances Davidson FMW:[id number] DOB: Nov 04, 1960 DOA: 03/11/2024  PCP: Melvin Pao, NP  Admit date: 03/11/2024 Discharge date: 03/12/2024  Time spent: 35 minutes  Recommendations for Outpatient Follow-up:  Close f/u with dermatology     Discharge Diagnoses:  Principal Problem:   Cellulitis of both lower extremities Active Problems:   SIRS (systemic inflammatory response syndrome) (HCC)   Atopic dermatitis, unspecified   At risk for infection due to immunosuppression   Bilateral lower extremity edema   Undifferentiated connective tissue disease (HCC)   Alcohol use disorder   Discharge Condition: stable  Diet recommendation: regular  Filed Weights   03/11/24 1529  Weight: 61.2 kg    History of present illness:  From admission h and p Frances Davidson is a 63 y.o. female with medical history significant for Undifferentiated connective tissue disease since 2023, on Plaquenil (last seen by rheumatology 02/22/2024), severe atopic dermatitis with history of polymicrobial skin infection due to maceration, last seen by dermatology 02/24/2024, when she was started on Dupixent  and varied potency topical steroids, who presents to the ED with a 4-day history of lower extremity swelling .  She also complains of a burning pain of the lower extremities.  Dermatology note reviewed from 6/5 when patient was given instructions on using the creams(clobetasol  0.1%, triamcinolone  0.1% and hydrocortisone  2.5%).  She states that she used all the creams on her legs but with an week, the skin started flaking off and her legs started burning so she stopped using it.  About 4 days ago she started noticing swelling in her feet which she had never had before.  She denies fever or chills. In the ED initially tachycardic to 145 but afebrile and with otherwise normal vitals.  Labs notable for normal WBC of 10,000,Elevated lactic acid of 2.5 improving to 1.3 with hydration.  Troponin 5, BNP 48.5.  Sed rate 23, CK1  58.  D-dimer 1.75.  Hemoglobin at baseline of 11.8.  EKG with sinus tachycardia at 140 with no ischemic changes. CTA chest negative for PE and otherwise nonacute.  Venous ultrasound negative for DVT. Patient treated with NS bolus, Rocephin  and morphine .  Admission requested   Hospital Course:  Patient presents with worsening of whole body rash (h and p says worsening of leg swelling but patient says this is worsening of rash throughout body). Tachycardic on arrival with elevated lactate (though interestingly lactic acid was normal at 17:45, elevated on repeat 5 minutes later, and then normal when checked 2 hours later....), admitted with cellulitis concern. No fever, leukocytosis. No crusting or weeping, no pustules. PVL negative for DVT. Sed is wnl, crp mildly elevated. Blood cultures ngtd. Treated with fluids and ceftriaxone /vancomycin . On exam there is generalized erythema and fine scaling throughout. Think whole body cellulitis is unlikely and would almost certainly see greater signs of systemic infection. Rather it appears this severe case of atopic dermatitis in setting of undifferentiated connective tissue disorder continues to fail to respond to treatment. Dermatology is not available here. I advised patient reach out to her dermatologist first thing tomorrow as most recent note said they will overbook the patient if she needs to be seen urgently. I advised should her condition worsen that a major medical center [facility]) would be the best venue for evaluation as there dermatology consult services are available. Will for possible infection with a course of keflex, though as stated above think this is relatively unlikely.   Procedures: none   Consultations: none  Discharge Exam: Vitals:   03/12/24  0402 03/12/24 0730  BP: (!) 92/52 (!) 82/58  Pulse: 98 96  Resp: 18 18  Temp: 98 F (36.7 C) 98.3 F (36.8 C)  SpO2: 97% 100%    General: NAD Cardiovascular: RRR Respiratory:  CTAB Skin        Discharge Instructions   Discharge Instructions     Diet general   Complete by: As directed    Increase activity slowly   Complete by: As directed       Allergies as of 03/12/2024   No Known Allergies      Medication List     STOP taking these medications    conjugated estrogens  vaginal cream Commonly known as: PREMARIN    predniSONE  10 MG tablet Commonly known as: DELTASONE    Trimo-San 0.025 % Gel Generic drug: OXYQUINOLONE SULFATE VAGINAL   trospium  20 MG tablet Commonly known as: SANCTURA    Zenpep  40000-126000 units Cpep Generic drug: Pancrelipase  (Lip-Prot-Amyl)       TAKE these medications    atorvastatin  20 MG tablet Commonly known as: LIPITOR Take 1 tablet (20 mg total) by mouth daily.   Calcium -Vitamin D 600-3.125 MG-MCG Tabs Take 1 capsule by mouth daily.   cephALEXin 250 MG capsule Commonly known as: KEFLEX Take 1 capsule (250 mg total) by mouth 4 (four) times daily for 6 days.   cetirizine  10 MG tablet Commonly known as: ZYRTEC  Take 1 tablet (10 mg total) by mouth daily. What changed:  when to take this reasons to take this   clobetasol  ointment 0.05 % Commonly known as: TEMOVATE  Apply 1 Application topically 2 (two) times daily. To affected areas body until smooth. Avoid applying to face, groin, and axilla. Use as directed. Long-term use can cause thinning of the skin.   Dupixent  300 MG/2ML Soaj Generic drug: Dupilumab  Inject 300 mg into the skin every 14 (fourteen) days. Starting at day 15 for maintenance.   Dupixent  300 MG/2ML Soaj Generic drug: Dupilumab  Inject 300 mg into the skin every 14 (fourteen) days. Starting at day 15 for maintenance.   gabapentin  300 MG capsule Commonly known as: NEURONTIN  Take 1 capsule (300 mg total) by mouth at bedtime.   Gemtesa  75 MG Tabs Generic drug: Vibegron  Take 1 tablet (75 mg total) by mouth daily. What changed: Another medication with the same name was removed.  Continue taking this medication, and follow the directions you see here.   hydrocortisone  2.5 % cream Apply topically 2 (two) times daily as needed (Rash). Bid to aa eczem until smooth on face, axilla, groin, inframammary   hydroxychloroquine 200 MG tablet Commonly known as: PLAQUENIL TAKE 1 TABLET BY MOUTH TWICE A DAY ON TUESDAY, THURSDAY, SATURDAY, SUNDAY AND TAKE 1 TABLET ON ALL OTHER DAYS .TAKE WITH FOOD   hydrOXYzine  25 MG capsule Commonly known as: VISTARIL  Take 1 capsule (25 mg total) by mouth every 8 (eight) hours as needed.   multivitamin capsule Take 1 capsule by mouth daily.   omeprazole  20 MG tablet Commonly known as: PriLOSEC  OTC Take 1 tablet (20 mg total) by mouth daily.   pimecrolimus  1 % cream Commonly known as: ELIDEL  APPLY TO THE AFFECTED AREA(S) 2 TIMES A DAY   triamcinolone  cream 0.1 % Commonly known as: KENALOG  Apply 1 Application topically 2 (two) times daily. Bid to aa eczema on body until smooth, avoid using on face, groin, axilla       No Known Allergies  Follow-up Information     Claudene Lehmann, MD Follow up.   Specialty: Dermatology  Contact information: [address] [place] [address] [phone]                  The results of significant diagnostics from this hospitalization (including imaging, microbiology, ancillary and laboratory) are listed below for reference.    Significant Diagnostic Studies: US  Venous Img Lower Bilateral Result Date: 03/11/2024 CLINICAL DATA:  Leg pain and swelling for several days EXAM: BILATERAL LOWER EXTREMITY VENOUS DOPPLER ULTRASOUND TECHNIQUE: Gray-scale sonography with graded compression, as well as color Doppler and duplex ultrasound were performed to evaluate the lower extremity deep venous systems from the level of the common femoral vein and including the common femoral, femoral, profunda femoral, popliteal and calf veins including the posterior tibial, peroneal and gastrocnemius  veins when visible. The superficial great saphenous vein was also interrogated. Spectral Doppler was utilized to evaluate flow at rest and with distal augmentation maneuvers in the common femoral, femoral and popliteal veins. COMPARISON:  None Available. FINDINGS: RIGHT LOWER EXTREMITY Common Femoral Vein: No evidence of thrombus. Normal compressibility, respiratory phasicity and response to augmentation. Saphenofemoral Junction: No evidence of thrombus. Normal compressibility and flow on color Doppler imaging. Profunda Femoral Vein: No evidence of thrombus. Normal compressibility and flow on color Doppler imaging. Femoral Vein: No evidence of thrombus. Normal compressibility, respiratory phasicity and response to augmentation. Popliteal Vein: No evidence of thrombus. Normal compressibility, respiratory phasicity and response to augmentation. Calf Veins: No evidence of thrombus. Normal compressibility and flow on color Doppler imaging. Superficial Great Saphenous Vein: No evidence of thrombus. Normal compressibility and flow on color Doppler imaging. Venous Reflux:  None. Other Findings:  None. LEFT LOWER EXTREMITY Common Femoral Vein: No evidence of thrombus. Normal compressibility, respiratory phasicity and response to augmentation. Saphenofemoral Junction: No evidence of thrombus. Normal compressibility and flow on color Doppler imaging. Profunda Femoral Vein: No evidence of thrombus. Normal compressibility and flow on color Doppler imaging. Femoral Vein: No evidence of thrombus. Normal compressibility, respiratory phasicity and response to augmentation. Popliteal Vein: No evidence of thrombus. Normal compressibility, respiratory phasicity and response to augmentation. Calf Veins: No evidence of thrombus. Normal compressibility and flow on color Doppler imaging. Superficial Great Saphenous Vein: No evidence of thrombus. Normal compressibility and flow on color Doppler imaging. Venous Reflux:  None. Other  Findings:  None. IMPRESSION: No evidence of deep venous thrombosis. Electronically Signed   By: Oneil Devonshire M.D.   On: 03/11/2024 18:28   CT Angio Chest PE W/Cm &/Or Wo Cm Result Date: 03/11/2024 CLINICAL DATA:  Positive D-dimer and lower extremity swelling, initial encounter EXAM: CT ANGIOGRAPHY CHEST WITH CONTRAST TECHNIQUE: Multidetector CT imaging of the chest was performed using the standard protocol during bolus administration of intravenous contrast. Multiplanar CT image reconstructions and MIPs were obtained to evaluate the vascular anatomy. RADIATION DOSE REDUCTION: This exam was performed according to the departmental dose-optimization program which includes automated exposure control, adjustment of the mA and/or kV according to patient size and/or use of iterative reconstruction technique. CONTRAST:  75mL OMNIPAQUE  IOHEXOL  350 MG/ML SOLN COMPARISON:  None Available. FINDINGS: Cardiovascular: Thoracic aorta its branches are within normal limits. No aneurysmal dilatation or dissection is noted. The pulmonary artery shows a normal branching pattern bilaterally. No filling defect to suggest pulmonary embolism is noted. No significant coronary calcifications are seen. Mediastinum/Nodes: Thoracic inlet is within normal limits. No hilar or mediastinal adenopathy is noted. The esophagus as visualized is within normal limits. Lungs/Pleura: The lungs are well aerated bilaterally. No focal infiltrate or effusion is seen. No  sizable parenchymal nodules are noted. Upper Abdomen: Visualized upper abdomen is unremarkable. Musculoskeletal: No acute rib abnormality is noted. Mild degenerative changes of the thoracic spine are seen. Review of the MIP images confirms the above findings. IMPRESSION: No evidence of pulmonary emboli. Electronically Signed   By: Oneil Devonshire M.D.   On: 03/11/2024 18:28    Microbiology: Recent Results (from the past 240 hours)  Blood culture (routine x 2)     Status: None (Preliminary  result)   Collection Time: 03/11/24  4:16 PM   Specimen: BLOOD RIGHT ARM  Result Value Ref Range Status   Specimen Description BLOOD RIGHT ARM  Final   Special Requests   Final    BOTTLES DRAWN AEROBIC AND ANAEROBIC Blood Culture adequate volume   Culture   Final    NO GROWTH < 12 HOURS Performed at [facility], [address] [place], [place] [address]    Report Status PENDING  Incomplete  Blood culture (routine x 2)     Status: None (Preliminary result)   Collection Time: 03/11/24  4:16 PM   Specimen: Left Antecubital; Blood  Result Value Ref Range Status   Specimen Description LEFT ANTECUBITAL  Final   Special Requests   Final    BOTTLES DRAWN AEROBIC AND ANAEROBIC Blood Culture results may not be optimal due to an inadequate volume of blood received in culture bottles   Culture   Final    NO GROWTH < 12 HOURS Performed at [facility], [address], [place], [place] [address]    Report Status PENDING  Incomplete     Labs: Basic Metabolic Panel: Recent Labs  Lab 03/11/24 1616 03/12/24 0517  NA 134* 137  K 3.8 3.8  CL 103 109  CO2 20* 22  GLUCOSE 85 87  BUN <5* <5*  CREATININE 0.97 0.75  CALCIUM  8.3* 7.6*   Liver Function Tests: Recent Labs  Lab 03/11/24 1616  AST 35  ALT 21  ALKPHOS 69  BILITOT 0.8  PROT 6.3*  ALBUMIN 3.2*   No results for input(s): LIPASE, AMYLASE in the last 168 hours. No results for input(s): AMMONIA in the last 168 hours. CBC: Recent Labs  Lab 03/11/24 1616 03/12/24 0517  WBC 10.3 8.1  HGB 11.8* 9.7*  HCT 36.1 29.6*  MCV 96.0 93.7  PLT 283 246   Cardiac Enzymes: Recent Labs  Lab 03/11/24 1616  CKTOTAL 158   BNP: BNP (last 3 results) Recent Labs    03/11/24 1745  BNP 48.5    ProBNP (last 3 results) No results for input(s): PROBNP in the last 8760 hours.  CBG: No results for input(s): GLUCAP in the last 168 hours.     Signed:  Devaughn KATHEE Ban MD.  [facility] 03/12/2024, 10:07 AM

## 2024-03-12 NOTE — Consult Note (Signed)
 Pharmacy Antibiotic Note  ASSESSMENT: 63 y.o. female with PMH including bilateral LEE, atopic dermatitis, SIRS, appendiceal abscess is presenting with concerns for cellulitis. She endorses bilateral leg swelling for past several days. Patient is tachycardic but otherwise VSS. Pharmacy has been consulted to manage vancomycin  dosing. She is at upper end of her baseline renal function.  PLAN: Administer vancomycin  1250mg  IV x 1 as a loading dose, followed by vancomycin  1250mg  IV q24H thereafter eAUC 499.8, Cmax 38.1, Cmin 10.6 Scr 0.8(actual 0.75), IBW, Vd 0.72 L/kg Follow up culture results to assess for antibiotic optimization. Monitor renal function to assess for any necessary antibiotic dosing changes.  Patient measurements: Height: 5' 5 (165.1 cm) Weight: 61.2 kg (135 lb) IBW/kg (Calculated) : 57  Vital signs: Temp: 98.3 F (36.8 C) (06/22 0730) Temp Source: Oral (06/22 0402) BP: 82/58 (06/22 0730) Pulse Rate: 96 (06/22 0730) Recent Labs  Lab 03/11/24 1616 03/12/24 0517  WBC 10.3 8.1  CREATININE 0.97 0.75   Estimated Creatinine Clearance: 63.1 mL/min (by C-G formula based on SCr of 0.75 mg/dL).  Allergies: No Known Allergies  Antimicrobials this admission: Ceftriaxone  6/22 >>  Vancomycin  6/22 >>  Dose adjustments this admission: N/A  Microbiology results: 6/21 BCx: NGTD  Thank you for allowing pharmacy to be a part of this patient's care.  Margrit Minner A Shaylyn Bawa, PharmD Clinical Pharmacist 03/12/2024 8:48 AM

## 2024-03-12 NOTE — TOC Initial Note (Signed)
 Transition of Care [facility]) - Initial/Assessment Note    Patient Details  Name: Frances Davidson MRN: [id number] Date of Birth: 1961-05-12  Transition of Care [facility]) CM/SW Contact:    Quintella Suzen Jansky, RN Phone Number: 03/12/2024, 7:33 AM  Clinical Narrative:                  Patient lives with spouse, independent with ADLs. She has PCP in community for follow-up after discharge. No discharge needs identified at this time by TOC. If TOC is needed please place [facility] consult.        Patient Goals and CMS Choice            Expected Discharge Plan and Services                                              Prior Living Arrangements/Services                       Activities of Daily Living   ADL Screening (condition at time of admission) Independently performs ADLs?: Yes (appropriate for developmental age) Is the patient deaf or have difficulty hearing?: No Does the patient have difficulty seeing, even when wearing glasses/contacts?: No Does the patient have difficulty concentrating, remembering, or making decisions?: No  Permission Sought/Granted                  Emotional Assessment              Admission diagnosis:  Cellulitis [L03.90] Leg edema [R60.0] Patient Active Problem List   Diagnosis Date Noted   Cellulitis of both lower extremities 03/11/2024   Atopic dermatitis, unspecified 03/11/2024   At risk for infection due to immunosuppression 03/11/2024   Bilateral lower extremity edema 03/11/2024   SIRS (systemic inflammatory response syndrome) (HCC) 03/11/2024   Alcohol use disorder 03/11/2024   Nocturia 02/17/2024   Feeling of incomplete bladder emptying 02/17/2024   Pelvic organ prolapse quantification stage 2 cystocele 02/17/2024   Vaginal atrophy 02/17/2024   Incontinence of feces 02/17/2024   Urge incontinence of urine 02/24/2023   Hyperlipidemia 01/26/2023   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 [place] [address] Phone: [phone] Fax: [phone]  [facility] - [place], [place] - [address] [place] [address] Phone: [phone] Fax: [phone]  [facility] [place], [place] [address] [place] [address] Phone: [phone] Fax: [phone]     Social Drivers of Health (SDOH) Social History: SDOH Screenings   Food Insecurity: Food Insecurity Present (03/11/2024)  Housing: Low Risk  (03/11/2024)  Transportation Needs: No Transportation Needs (03/11/2024)  Utilities: Not At Risk (03/11/2024)  Alcohol Screen: Low Risk  (02/07/2024)  Depression (PHQ2-9): Low Risk  (08/09/2023)  Financial Resource Strain: High Risk (02/07/2024)  Physical Activity: Insufficiently Active (02/07/2024)  Social Connections: Socially Isolated (02/07/2024)  Stress: Stress Concern  Present (02/07/2024)  Tobacco Use: Low Risk  (03/11/2024)   SDOH Interventions:     Readmission Risk Interventions     No data to display

## 2024-03-12 NOTE — TOC Transition Note (Signed)
 Transition of Care [facility]) - Discharge Note   Patient Details  Name: Frances Davidson MRN: [id number] Date of Birth: 04-27-1961  Transition of Care [facility]) CM/SW Contact:  Quintella Suzen Jansky, RN Phone Number: 03/12/2024, 11:23 AM   Clinical Narrative:     Spoke with patient via phone regarding patient Observation status at hospital. She verbalized understanding. MOON letter printed to nurse station. Notified bedside nurse and MD  Final next level of care: Home/Self Care Barriers to Discharge: Barriers Resolved   Patient Goals and CMS Choice Patient states their goals for this hospitalization and ongoing recovery are:: go home          Discharge Placement                  Name of family member notified: [name] Patient and family notified of of transfer: 03/12/24  Discharge Plan and Services Additional resources added to the After Visit Summary for                    DME Agency: NA       HH Arranged: NA          Social Drivers of Health (SDOH) Interventions SDOH Screenings   Food Insecurity: Food Insecurity Present (03/11/2024)  Housing: Low Risk  (03/11/2024)  Transportation Needs: No Transportation Needs (03/11/2024)  Utilities: Not At Risk (03/11/2024)  Alcohol Screen: Low Risk  (02/07/2024)  Depression (PHQ2-9): Low Risk  (08/09/2023)  Financial Resource Strain: High Risk (02/07/2024)  Physical Activity: Insufficiently Active (02/07/2024)  Social Connections: Unknown (03/12/2024)  Recent Concern: Social Connections - Socially Isolated (02/07/2024)  Stress: Stress Concern Present (02/07/2024)  Tobacco Use: Low Risk  (03/11/2024)     Readmission Risk Interventions     No data to display

## 2024-03-12 NOTE — Plan of Care (Signed)

## 2024-03-16 ENCOUNTER — Encounter: Payer: Self-pay | Admitting: Dermatology

## 2024-03-16 ENCOUNTER — Ambulatory Visit (INDEPENDENT_AMBULATORY_CARE_PROVIDER_SITE_OTHER): Admitting: Dermatology

## 2024-03-16 ENCOUNTER — Telehealth: Payer: Self-pay

## 2024-03-16 DIAGNOSIS — L539 Erythematous condition, unspecified: Secondary | ICD-10-CM

## 2024-03-16 DIAGNOSIS — L281 Prurigo nodularis: Secondary | ICD-10-CM | POA: Diagnosis not present

## 2024-03-16 DIAGNOSIS — R229 Localized swelling, mass and lump, unspecified: Secondary | ICD-10-CM

## 2024-03-16 DIAGNOSIS — Z79899 Other long term (current) drug therapy: Secondary | ICD-10-CM

## 2024-03-16 DIAGNOSIS — L209 Atopic dermatitis, unspecified: Secondary | ICD-10-CM

## 2024-03-16 DIAGNOSIS — Z7189 Other specified counseling: Secondary | ICD-10-CM

## 2024-03-16 LAB — CULTURE, BLOOD (ROUTINE X 2)
Culture: NO GROWTH
Culture: NO GROWTH
Special Requests: ADEQUATE

## 2024-03-16 MED ORDER — TRIAMCINOLONE ACETONIDE 0.1 % EX OINT: TOPICAL_OINTMENT | CUTANEOUS | 3 refills | Status: AC

## 2024-03-16 NOTE — Patient Instructions (Addendum)
 Start Rinvoq and take 1 tablet daily.  Risk of thrombosis, cardiovascular event, malignancy infection zoster  Go to ER if you become feverish or feel ill.   Start Triamcinolone  0.1% ointment twice a day to affected areas.  Avoid applying to face, groin, and axilla. Use as directed. Long-term use can cause thinning of the skin.    Recommend getting 2nd Shingrix vaccine if Rinvoq helps and you wish to continue.     Gentle Skin Care Guide  1. Bathe no more than once a day.  2. Avoid bathing in hot water  3. Use a mild soap like Dove, Vanicream, Cetaphil, CeraVe. Can use Lever 2000 or Cetaphil antibacterial soap  4. Use soap only where you need it. On most days, use it under your arms, between your legs, and on your feet. Let the water rinse other areas unless visibly dirty.  5. When you get out of the bath/shower, use a towel to gently blot your skin dry, don't rub it.  6. While your skin is still a little damp, apply a moisturizing cream such as Vanicream, CeraVe, Cetaphil, Eucerin, Sarna lotion or plain Vaseline Jelly. For hands apply Neutrogena [place] Hand Cream or Excipial Hand Cream.  7. Reapply moisturizer any time you start to itch or feel dry.  8. Sometimes using free and clear laundry detergents can be helpful. Fabric softener sheets should be avoided. Downy Free & Gentle liquid, or any liquid fabric softener that is free of dyes and perfumes, it acceptable to use  9. If your doctor has given you prescription creams you may apply moisturizers over them       Due to recent changes in healthcare laws, you may see results of your pathology and/or laboratory studies on MyChart before the doctors have had a chance to review them. We understand that in some cases there may be results that are confusing or concerning to you. Please understand that not all results are received at the same time and often the doctors may need to interpret multiple results in order to provide  you with the best plan of care or course of treatment. Therefore, we ask that you please give us  2 business days to thoroughly review all your results before contacting the office for clarification. Should we see a critical lab result, you will be contacted sooner.   If You Need Anything After Your Visit  If you have any questions or concerns for your doctor, please call our main line at [phone] and press option 4 to reach your doctor's medical assistant. If no one answers, please leave a voicemail as directed and we will return your call as soon as possible. Messages left after 4 pm will be answered the following business day.   You may also send us  a message via MyChart. We typically respond to MyChart messages within 1-2 business days.  For prescription refills, please ask your pharmacy to contact our office. Our fax number is [phone].  If you have an urgent issue when the clinic is closed that cannot wait until the next business day, you can page your doctor at the number below.    Please note that while we do our best to be available for urgent issues outside of office hours, we are not available 24/7.   If you have an urgent issue and are unable to reach us , you may choose to seek medical care at your doctor's office, retail clinic, urgent care center, or emergency room.  If you have  a medical emergency, please immediately call 911 or go to the emergency department.  Pager Numbers  - Dr. Hester: [phone]  - Dr. Jackquline: [phone]  - Dr. Claudene: [phone]   In the event of inclement weather, please call our main line at [phone] for an update on the status of any delays or closures.  Dermatology Medication Tips: Please keep the boxes that topical medications come in in order to help keep track of the instructions about where and how to use these. Pharmacies typically print the medication instructions only on the boxes and not directly on the medication  tubes.   If your medication is too expensive, please contact our office at [phone] option 4 or send us  a message through MyChart.   We are unable to tell what your co-pay for medications will be in advance as this is different depending on your insurance coverage. However, we may be able to find a substitute medication at lower cost or fill out paperwork to get insurance to cover a needed medication.   If a prior authorization is required to get your medication covered by your insurance company, please allow us  1-2 business days to complete this process.  Drug prices often vary depending on where the prescription is filled and some pharmacies may offer cheaper prices.  The website www.goodrx.com contains coupons for medications through different pharmacies. The prices here do not account for what the cost may be with help from insurance (it may be cheaper with your insurance), but the website can give you the price if you did not use any insurance.  - You can print the associated coupon and take it with your prescription to the pharmacy.  - You may also stop by our office during regular business hours and pick up a GoodRx coupon card.  - If you need your prescription sent electronically to a different pharmacy, notify our office through [facility] or by phone at [phone] option 4.     Si Usted Necesita Algo Despus de Su Visita  Tambin puede enviarnos un mensaje a travs de Clinical cytogeneticist. Por lo general respondemos a los mensajes de MyChart en el transcurso de 1 a 2 das hbiles.  Para renovar recetas, por favor pida a su farmacia que se ponga en contacto con nuestra oficina. Randi lakes de fax es [place] [phone].  Si tiene un asunto urgente cuando la clnica est cerrada y que no puede esperar hasta el siguiente da hbil, puede llamar/localizar a su doctor(a) al nmero que aparece a continuacin.   Por favor, tenga en cuenta que aunque hacemos todo lo posible para estar  disponibles para asuntos urgentes fuera del horario de [place], no estamos disponibles las 24 horas del da, los 7 [address]  [address] de la [place].   Si tiene un problema urgente y no puede comunicarse con nosotros, puede optar por buscar atencin mdica  en el consultorio de su doctor(a), en una clnica privada, en un centro de atencin urgente o en una sala de emergencias.  Si tiene Engineer, drilling, por favor llame inmediatamente al 911 o vaya a la sala de emergencias.  Nmeros de bper  - Dr. Hester: [phone]  - Dra. Jackquline: [phone]  - Dr. Claudene: [phone]   En caso de inclemencias del tiempo, por favor llame a landry capes principal al [phone] para una actualizacin sobre el [place] de cualquier retraso o cierre.  Consejos para la medicacin en dermatologa: Por favor, guarde las cajas en las que vienen los medicamentos de [place]  tpico para ayudarle a seguir las instrucciones sobre dnde y cmo usarlos. Las farmacias generalmente imprimen las instrucciones del medicamento slo en las cajas y no directamente en los tubos del [place].   Si su medicamento es muy caro, por favor, pngase en contacto con landry rieger llamando al [phone] y presione la opcin 4 o envenos un mensaje a travs de Clinical cytogeneticist.   No podemos decirle cul ser su copago por los medicamentos por adelantado ya que esto es diferente dependiendo de la cobertura de su seguro. Sin embargo, es posible que podamos encontrar un medicamento sustituto a Audiological scientist un formulario para que el seguro cubra el medicamento que se considera necesario.   Si se requiere una autorizacin previa para que su compaa de seguros malta su medicamento, por favor permtanos de 1 a 2 das hbiles para completar este proceso.  Los precios de los medicamentos varan con frecuencia dependiendo del Environmental consultant de dnde se surte la receta y alguna farmacias pueden ofrecer precios ms baratos.  El sitio web www.goodrx.com  tiene cupones para medicamentos de Health and safety inspector. Los precios aqu no tienen en cuenta lo que podra costar con la ayuda del seguro (puede ser ms barato con su seguro), pero el sitio web puede darle el precio si no utiliz Tourist information centre manager.  - Puede imprimir el cupn correspondiente y llevarlo con su receta a la farmacia.  - Tambin puede pasar por nuestra oficina durante el horario de atencin regular y Education officer, museum una tarjeta de cupones de GoodRx.  - Si necesita que su receta se enve electrnicamente a una farmacia diferente, informe a nuestra oficina a travs de MyChart de  o por telfono llamando al [phone] y presione la opcin 4.

## 2024-03-16 NOTE — Progress Notes (Signed)
   Follow Up Visit   Subjective  Frances Davidson is a 63 y.o. female who presents for the following: Rash/redness all over body. Swelling of legs. Admitted to ED 03/11/24 and discharged 03/12/2024 for this issue. States her skin is itching and burning. Painful to sit, can barely walk. Stopped Clobetasol , states it burned her skin and made her peel.  Currently taking cephalexin  250 mg 4 times a day for 6 days.   ED: CTA chest negative for PE and otherwise nonacute. Venous ultrasound negative for DVT.   Husband is with patient and contributes to history.   The following portions of the chart were reviewed this encounter and updated as appropriate: medications, allergies, medical history  Review of Systems:  No other skin or systemic complaints except as noted in HPI or Assessment and Plan.  Objective  Well appearing patient in no apparent distress; mood and affect are within normal limits.  A focused examination was performed of the following areas: Head, torso, limbs   Relevant exam findings are noted in the Assessment and Plan.                 Assessment & Plan   ATOPIC DERMATITIS, UNSPECIFIED TYPE   Related Procedures QuantiFERON-TB Gold Plus LONG-TERM USE OF HIGH-RISK MEDICATION   Related Procedures QuantiFERON-TB Gold Plus COUNSELING AND COORDINATION OF CARE   MEDICATION MANAGEMENT   PRURIGO NODULARIS   ERYTHRODERMA    ATOPIC DERMATITIS with PRURIGO NODULARIS, erythrodermic Severe failed pimecrolimus , clobetasol , triamcinolone , dupixent  Restarted Dupixent  01/24/24  Exam: edematous erythematous scaly confluent plaques of trunk arms legs hands including palms, feet. Severe peri-malleolar edema. 90% BSA  09/06/23 biopsy R arm LSC 03/11/24 AST ALT alk phos normal. Blood culture NG5D. CRP mildly elevated 1.7, ESR normal 03/12/24 CBC low RBC 3.16, normal WBC 03/12/24 BMP normal Cr, low BUN, low Ca 7.6, normal Na K CO2 02/09/24 HIV neg, TSH normal,  lipid panel mildly elevated TG 157 otherwise normal 07/08/21 HBV core Ab surface Ag surface Ab neg 03/10/21 HCV Ab neg 11/12/2008 shingrix x 1 Reviewed 03/11/24 ED note  Treatment Plan: Recommended repeat biopsy given change in presentation, patient declined Stop Dupixent  given lack of efficacy Rinvoq 15 mg sample given today to take once daily.  Lot: [id number] Exp: 08/2025  Risk of thrombosis, cardiovascular event, malignancy infection zoster with rinvoq No personal Hx of blood clots.  Start Triamcinolone  0.1% ointment twice a day to affected areas. Avoid applying to face, groin, and axilla. Use as directed. Long-term use can cause thinning of the skin. Advised patient to not start Rinvoq until receive TB test. Patient states she is low risk for TB and has been negative in the past and wishes to start Rinvoq. Recommend getting 2nd Shingrix vaccine if Rinvoq helps and patient wishes to continue.   Patient instructed to go to ED if becomes feverish or feeling ill while taking rinvoq  Skin is swelling secondary to the inflammation.   Order given today for TB test. Patient states she will go when her skin has improved and is no longer painful.   Return in about 1 week (around 03/23/2024) for Atopic Dermatitis Follow Up.  I, Kate Fought, CMA, am acting as scribe for Boneta Sharps, MD.   Documentation: I have reviewed the above documentation for accuracy and completeness, and I agree with the above.  Boneta Sharps, MD

## 2024-03-16 NOTE — Telephone Encounter (Signed)
 Patient's spouse called stating wife was in excruciating pain, body red, and legs swollen. Patient was seen and admitted into the ED and was told to follow-up with dermatology. Please advise

## 2024-03-20 ENCOUNTER — Encounter: Payer: Self-pay | Admitting: Dermatology

## 2024-03-23 ENCOUNTER — Ambulatory Visit: Admitting: Dermatology

## 2024-03-27 ENCOUNTER — Ambulatory Visit: Payer: Self-pay | Admitting: Dermatology

## 2024-03-27 LAB — QUANTIFERON-TB GOLD PLUS
QuantiFERON Mitogen Value: 5.57 [IU]/mL
QuantiFERON Nil Value: 0.1 [IU]/mL
QuantiFERON TB1 Ag Value: 0.1 [IU]/mL
QuantiFERON TB2 Ag Value: 0.09 [IU]/mL
QuantiFERON-TB Gold Plus: NEGATIVE

## 2024-03-28 ENCOUNTER — Encounter: Admitting: Nurse Practitioner

## 2024-03-29 ENCOUNTER — Telehealth: Payer: Self-pay

## 2024-03-29 NOTE — Telephone Encounter (Signed)
 Patient called about needing Rinvoq sample.  Spoke with Dr. Claudene who Oked 1 sample and patient counseled on cancelling appointments last minute and exceeding limitations as a patient within our clinic.   Patient advised to keep July 21st follow up.  Sample left at front desk for pick up. LOT: [id number] EXP: 10/2024

## 2024-03-31 NOTE — Telephone Encounter (Unsigned)
 Copied from CRM [id number]. Topic: MyChart - Other >> Mar 31, 2024 12:02 PM Mia F wrote: Reason for CRM: Pt called to update her email. She would like to use that email for her My Chart account. New email is in chart

## 2024-04-07 ENCOUNTER — Ambulatory Visit (INDEPENDENT_AMBULATORY_CARE_PROVIDER_SITE_OTHER): Admitting: Certified Nurse Midwife

## 2024-04-07 ENCOUNTER — Encounter: Payer: Self-pay | Admitting: Certified Nurse Midwife

## 2024-04-07 VITALS — BP 99/76 | HR 104 | Ht 65.5 in | Wt 140.0 lb

## 2024-04-07 DIAGNOSIS — N898 Other specified noninflammatory disorders of vagina: Secondary | ICD-10-CM

## 2024-04-07 DIAGNOSIS — N907 Vulvar cyst: Secondary | ICD-10-CM | POA: Diagnosis not present

## 2024-04-07 MED ORDER — AMOXICILLIN 500 MG PO CAPS: 500.0000 mg | ORAL_CAPSULE | Freq: Three times a day (TID) | ORAL | 2 refills | Status: DC

## 2024-04-07 NOTE — Progress Notes (Signed)
    GYNECOLOGY PROGRESS NOTE  Subjective:  PCP: Melvin Pao, NP  Patient ID: Frances Davidson, female    DOB: Sep 01, 1961, 63 y.o.   MRN: [id number]  HPI  Patient is a 63 y.o. [id number] female who presents for a lump on her right labia that has been growing over the last few weeks and becoming very painful.  She says she's had these pimples before and she can usually pop them, but she cannot pop this one. She believes she may have caused a sore to the skin while trying to pop the cyst.     She denies any history of STD.  Pap NILM 02/18/2021.    Patient has connective tissue disorder that does affect her skin.   The following portions of the patient's history were reviewed and updated as appropriate: allergies, current medications, past family history, past medical history, past social history, past surgical history, and problem list.  Review of Systems Pertinent items are noted in HPI.   Objective:   Blood pressure 99/76, pulse (!) 104, height 5' 5.5 (1.664 m), weight 140 lb (63.5 kg), last menstrual period 04/21/2014. Body mass index is 22.94 kg/m.  General appearance: alert Abdomen: soft, non-tender; bowel sounds normal; no masses,  no organomegaly Pelvic: positive findings: right labial 3cm fluid filled area. Quarter sized abrasion with hard exudate on the margins. Painful to touch     Assessment/Plan:   1. Labial cyst      -Reviewed options with patient and she would like it drained. Lidocaine  injected into upper aspect of mass and scaple gently used to open 3mm area. Bloody drainage noted. Will treat with antibiotics for possible infection.  -Follow up in one 1-2 weeks   [name], CNM [facility]

## 2024-04-08 LAB — CBC
Hematocrit: 31.8 % — ABNORMAL LOW (ref 34.0–46.6)
Hemoglobin: 10.2 g/dL — ABNORMAL LOW (ref 11.1–15.9)
MCH: 30.5 pg (ref 26.6–33.0)
MCHC: 32.1 g/dL (ref 31.5–35.7)
MCV: 95 fL (ref 79–97)
Platelets: 185 x10E3/uL (ref 150–450)
RBC: 3.34 x10E6/uL — ABNORMAL LOW (ref 3.77–5.28)
RDW: 13.3 % (ref 11.7–15.4)
WBC: 5 x10E3/uL (ref 3.4–10.8)

## 2024-04-08 LAB — RPR: RPR Ser Ql: NONREACTIVE

## 2024-04-10 ENCOUNTER — Ambulatory Visit (INDEPENDENT_AMBULATORY_CARE_PROVIDER_SITE_OTHER): Admitting: Dermatology

## 2024-04-10 ENCOUNTER — Encounter: Payer: Self-pay | Admitting: Dermatology

## 2024-04-10 DIAGNOSIS — L281 Prurigo nodularis: Secondary | ICD-10-CM

## 2024-04-10 DIAGNOSIS — Z79899 Other long term (current) drug therapy: Secondary | ICD-10-CM

## 2024-04-10 DIAGNOSIS — L209 Atopic dermatitis, unspecified: Secondary | ICD-10-CM

## 2024-04-10 DIAGNOSIS — Z7189 Other specified counseling: Secondary | ICD-10-CM

## 2024-04-10 MED ORDER — UPADACITINIB ER 15 MG PO TB24: 1.0000 | ORAL_TABLET | Freq: Every day | ORAL | 2 refills | Status: DC

## 2024-04-10 NOTE — Patient Instructions (Addendum)
 Recommend using Hydrocortisone  2.5% cream to affected areas at groin.  Continue Rinvoq  15 mg once daily.   Do not use Dupixent  injection since was no longer working.      Gentle Skin Care Guide  1. Bathe no more than once a day.  2. Avoid bathing in hot water  3. Use a mild soap like Dove, Vanicream, Cetaphil, CeraVe. Can use Lever 2000 or Cetaphil antibacterial soap  4. Use soap only where you need it. On most days, use it under your arms, between your legs, and on your feet. Let the water rinse other areas unless visibly dirty.  5. When you get out of the bath/shower, use a towel to gently blot your skin dry, don't rub it.  6. While your skin is still a little damp, apply a moisturizing cream such as Vanicream, CeraVe, Cetaphil, Eucerin, Sarna lotion or plain Vaseline Jelly. For hands apply Neutrogena [place] Hand Cream or Excipial Hand Cream.  7. Reapply moisturizer any time you start to itch or feel dry.  8. Sometimes using free and clear laundry detergents can be helpful. Fabric softener sheets should be avoided. Downy Free & Gentle liquid, or any liquid fabric softener that is free of dyes and perfumes, it acceptable to use  9. If your doctor has given you prescription creams you may apply moisturizers over them       Due to recent changes in healthcare laws, you may see results of your pathology and/or laboratory studies on MyChart before the doctors have had a chance to review them. We understand that in some cases there may be results that are confusing or concerning to you. Please understand that not all results are received at the same time and often the doctors may need to interpret multiple results in order to provide you with the best plan of care or course of treatment. Therefore, we ask that you please give us  2 business days to thoroughly review all your results before contacting the office for clarification. Should we see a critical lab result, you will be  contacted sooner.   If You Need Anything After Your Visit  If you have any questions or concerns for your doctor, please call our main line at [phone] and press option 4 to reach your doctor's medical assistant. If no one answers, please leave a voicemail as directed and we will return your call as soon as possible. Messages left after 4 pm will be answered the following business day.   You may also send us  a message via MyChart. We typically respond to MyChart messages within 1-2 business days.  For prescription refills, please ask your pharmacy to contact our office. Our fax number is [phone].  If you have an urgent issue when the clinic is closed that cannot wait until the next business day, you can page your doctor at the number below.    Please note that while we do our best to be available for urgent issues outside of office hours, we are not available 24/7.   If you have an urgent issue and are unable to reach us , you may choose to seek medical care at your doctor's office, retail clinic, urgent care center, or emergency room.  If you have a medical emergency, please immediately call 911 or go to the emergency department.  Pager Numbers  - Dr. Hester: [phone]  - Dr. Jackquline: [phone]  - Dr. Claudene: [phone]   In the event of inclement weather, please call our main line  at [phone] for an update on the status of any delays or closures.  Dermatology Medication Tips: Please keep the boxes that topical medications come in in order to help keep track of the instructions about where and how to use these. Pharmacies typically print the medication instructions only on the boxes and not directly on the medication tubes.   If your medication is too expensive, please contact our office at [phone] option 4 or send us  a message through MyChart.   We are unable to tell what your co-pay for medications will be in advance as this is different depending on  your insurance coverage. However, we may be able to find a substitute medication at lower cost or fill out paperwork to get insurance to cover a needed medication.   If a prior authorization is required to get your medication covered by your insurance company, please allow us  1-2 business days to complete this process.  Drug prices often vary depending on where the prescription is filled and some pharmacies may offer cheaper prices.  The website www.goodrx.com contains coupons for medications through different pharmacies. The prices here do not account for what the cost may be with help from insurance (it may be cheaper with your insurance), but the website can give you the price if you did not use any insurance.  - You can print the associated coupon and take it with your prescription to the pharmacy.  - You may also stop by our office during regular business hours and pick up a GoodRx coupon card.  - If you need your prescription sent electronically to a different pharmacy, notify our office through [facility] or by phone at [phone] option 4.     Si Usted Necesita Algo Despus de Su Visita  Tambin puede enviarnos un mensaje a travs de Clinical cytogeneticist. Por lo general respondemos a los mensajes de MyChart en el transcurso de 1 a 2 das hbiles.  Para renovar recetas, por favor pida a su farmacia que se ponga en contacto con nuestra oficina. Randi lakes de fax es [place] [phone].  Si tiene un asunto urgente cuando la clnica est cerrada y que no puede esperar hasta el siguiente da hbil, puede llamar/localizar a su doctor(a) al nmero que aparece a continuacin.   Por favor, tenga en cuenta que aunque hacemos todo lo posible para estar disponibles para asuntos urgentes fuera del horario de [place], no estamos disponibles las 24 horas del da, los 7 [address]  [address] de la [place].   Si tiene un problema urgente y no puede comunicarse con nosotros, puede optar por buscar atencin mdica  en el  consultorio de su doctor(a), en una clnica privada, en un centro de atencin urgente o en una sala de emergencias.  Si tiene Engineer, drilling, por favor llame inmediatamente al 911 o vaya a la sala de emergencias.  Nmeros de bper  - Dr. Hester: [phone]  - Dra. Jackquline: [phone]  - Dr. Claudene: [phone]   En caso de inclemencias del tiempo, por favor llame a landry capes principal al [phone] para una actualizacin sobre el [place] de cualquier retraso o cierre.  Consejos para la medicacin en dermatologa: Por favor, guarde las cajas en las que vienen los medicamentos de uso tpico para ayudarle a seguir las instrucciones sobre dnde y cmo usarlos. Las farmacias generalmente imprimen las instrucciones del medicamento slo en las cajas y no directamente en los tubos del [place].   Si su medicamento es [employer], por favor, pngase en  contacto con landry rieger llamando al [phone] y presione la opcin 4 o envenos un mensaje a travs de Clinical cytogeneticist.   No podemos decirle cul ser su copago por los medicamentos por adelantado ya que esto es diferente dependiendo de la cobertura de su seguro. Sin embargo, es posible que podamos encontrar un medicamento sustituto a Audiological scientist un formulario para que el seguro cubra el medicamento que se considera necesario.   Si se requiere una autorizacin previa para que su compaa de seguros malta su medicamento, por favor permtanos de 1 a 2 das hbiles para completar este proceso.  Los precios de los medicamentos varan con frecuencia dependiendo del Environmental consultant de dnde se surte la receta y alguna farmacias pueden ofrecer precios ms baratos.  El sitio web www.goodrx.com tiene cupones para medicamentos de Health and safety inspector. Los precios aqu no tienen en cuenta lo que podra costar con la ayuda del seguro (puede ser ms barato con su seguro), pero el sitio web puede darle el precio si no utiliz Tourist information centre manager.  -  Puede imprimir el cupn correspondiente y llevarlo con su receta a la farmacia.  - Tambin puede pasar por nuestra oficina durante el horario de atencin regular y Education officer, museum una tarjeta de cupones de GoodRx.  - Si necesita que su receta se enve electrnicamente a una farmacia diferente, informe a nuestra oficina a travs de MyChart de [place] o por telfono llamando al [phone] y presione la opcin 4.

## 2024-04-10 NOTE — Progress Notes (Signed)
 Follow-Up Visit   Subjective  Frances Davidson is a 63 y.o. female who presents for the following: Atopic Dermatitis. 1 month follow up. On Rinvoq  15 mg since 03/16/2024. Would like another sample. Has not heard anything from insurance if Rinvoq  is covered.  Patient reports her skin is doing much better. Still itching She is able to walk and sit normally. She states her skin is darker than normal now. Swelling has greatly improved. States ankles will swell at times.   Was not controlled by Dupixent .    Husband is with patient and contributes to history.   The following portions of the chart were reviewed this encounter and updated as appropriate: medications, allergies, medical history  Review of Systems:  No other skin or systemic complaints except as noted in HPI or Assessment and Plan.  Objective  Well appearing patient in no apparent distress; mood and affect are within normal limits.  Areas Examined: Face, arms, legs  Relevant physical exam findings are noted in the Assessment and Plan.    Assessment & Plan   ATOPIC DERMATITIS, UNSPECIFIED TYPE   Related Procedures Lipid Panel CMP Related Medications Upadacitinib  ER 15 MG TB24 Take 1 tablet (15 mg total) by mouth daily. LONG-TERM USE OF HIGH-RISK MEDICATION   Related Procedures Lipid Panel CMP Related Medications Upadacitinib  ER 15 MG TB24 Take 1 tablet (15 mg total) by mouth daily. COUNSELING AND COORDINATION OF CARE   MEDICATION MANAGEMENT   PRURIGO NODULARIS   Related Medications Upadacitinib  ER 15 MG TB24 Take 1 tablet (15 mg total) by mouth daily.   ATOPIC DERMATITIS, previously erythrodermic with prurigo nodularis, severe, significantly improved on Rinvoq   FAILED pimecrolimus , clobetasol , triamcinolone , dupixent    Exam: significant reduction in erythematous edematous scaly confluent plaques. Residual but improved erythematous edematous plaques of lower legs and feet 5% BSA improved from  90%  Chronic and persistent condition with duration or expected duration over one year. Condition is improving with treatment but not currently at goal.  03/22/24 quant gold negative 04/07/24 CBC stable anemia Hgb 10.2, normal WBC Plt, RPR neg 02/09/24 HIV neg, TSH normal, lipid panel mildly elevated TG 157 otherwise normal 07/08/21 HBV core Ab surface Ag surface Ab neg 03/10/21 HCV Ab neg 11/12/2008 shingrix x 1  Atopic dermatitis (eczema) is a chronic, relapsing, pruritic condition that can significantly affect quality of life. It is often associated with allergic rhinitis and/or asthma and can require treatment with topical medications, phototherapy, or in severe cases biologic injectable medication (Dupixent ; Adbry) or Oral JAK inhibitors.  Treatment Plan: Offered to look at groin lesion. Patient defers exam. Offered for Dr Jackquline to check lesion if patient is more comfortable. Patient will call and ask if desired. Recommend using Hydrocortisone  2.5% cream to affected areas at groin.  Continue Rinvoq  15 mg once daily. No side effects. Risks include but not limited to acne infection immunosuppression zoster hepatitis cytopenia. Can increase to 30 mg if patient flares again. Contact us  if worsening. Contact us  for more samples as needed until rinvoq  is approved Sample x1 given today.  Lot: [id number] Exp: 08/2025 Recommend getting 2nd Shingrix vaccine since will be continuing Rinvoq . Patient has prescription for it from PCP Get labs: CMP lipid Do not use Dupixent  injection since was no longer effective.   Recommend gentle skin care.   Return in about 3 months (around 07/11/2024) for Atopic Dermatitis Follow Up.  I, Kate Fought, CMA, am acting as scribe for Boneta Sharps, MD.   Documentation: I have reviewed  the above documentation for accuracy and completeness, and I agree with the above.  Boneta Sharps, MD

## 2024-04-11 LAB — COMPREHENSIVE METABOLIC PANEL WITH GFR
ALT: 18 IU/L (ref 0–32)
AST: 34 IU/L (ref 0–40)
Albumin: 4.2 g/dL (ref 3.9–4.9)
Alkaline Phosphatase: 72 IU/L (ref 44–121)
BUN/Creatinine Ratio: 14 (ref 12–28)
BUN: 11 mg/dL (ref 8–27)
Bilirubin Total: 0.4 mg/dL (ref 0.0–1.2)
CO2: 21 mmol/L (ref 20–29)
Calcium: 9.2 mg/dL (ref 8.7–10.3)
Chloride: 104 mmol/L (ref 96–106)
Creatinine, Ser: 0.78 mg/dL (ref 0.57–1.00)
Globulin, Total: 2.1 g/dL (ref 1.5–4.5)
Glucose: 75 mg/dL (ref 70–99)
Potassium: 4.5 mmol/L (ref 3.5–5.2)
Sodium: 142 mmol/L (ref 134–144)
Total Protein: 6.3 g/dL (ref 6.0–8.5)
eGFR: 84 mL/min/1.73 (ref >59)

## 2024-04-11 LAB — LIPID PANEL
Chol/HDL Ratio: 1.7 ratio (ref 0.0–4.4)
Cholesterol, Total: 240 mg/dL — ABNORMAL HIGH (ref 100–199)
HDL: 139 mg/dL (ref >39)
LDL Chol Calc (NIH): 74 mg/dL (ref 0–99)
Triglycerides: 174 mg/dL — ABNORMAL HIGH (ref 0–149)
VLDL Cholesterol Cal: 27 mg/dL (ref 5–40)

## 2024-04-12 ENCOUNTER — Ambulatory Visit: Payer: Self-pay | Admitting: Dermatology

## 2024-04-19 NOTE — Progress Notes (Unsigned)
    GYNECOLOGY PROGRESS NOTE  Subjective:    Patient ID: Agam Tuohy, female    DOB: 04-03-1961, 63 y.o.   MRN: [id number]  HPI  Patient is a 63 y.o. G34P3003 female who presents for 1 week follow up for labial cyst. She was evaluated on 07/18, she had a cyst on her right labia. Cyst was drained and patient was treated with antibiotics.  {Common ambulatory SmartLinks:19316}  Review of Systems {ros; complete:30496}   Objective:   Last menstrual period 04/21/2014. There is no height or weight on file to calculate BMI. General appearance: {general exam:16600} Abdomen: {abdominal exam:16834} Pelvic: {pelvic exam:16852::cervix normal in appearance,external genitalia normal,no adnexal masses or tenderness,no cervical motion tenderness,rectovaginal septum normal,uterus normal size, shape, and consistency,vagina normal without discharge} Extremities: {extremity exam:5109} Neurologic: {neuro exam:17854}   Assessment:   1. Labial cyst      Plan:   There are no diagnoses linked to this encounter.    [name], CNM [facility]

## 2024-04-21 ENCOUNTER — Ambulatory Visit (INDEPENDENT_AMBULATORY_CARE_PROVIDER_SITE_OTHER): Admitting: Certified Nurse Midwife

## 2024-04-21 ENCOUNTER — Encounter: Payer: Self-pay | Admitting: Certified Nurse Midwife

## 2024-04-21 ENCOUNTER — Other Ambulatory Visit (HOSPITAL_COMMUNITY)
Admission: RE | Admit: 2024-04-21 | Discharge: 2024-04-21 | Disposition: A | Discharge status: Home / Self Care | Source: Ambulatory Visit | Attending: Certified Nurse Midwife | Admitting: Certified Nurse Midwife

## 2024-04-21 VITALS — BP 117/88 | HR 93 | Resp 16 | Ht 65.5 in | Wt 141.9 lb

## 2024-04-21 DIAGNOSIS — N898 Other specified noninflammatory disorders of vagina: Secondary | ICD-10-CM

## 2024-04-21 DIAGNOSIS — N907 Vulvar cyst: Secondary | ICD-10-CM | POA: Diagnosis not present

## 2024-04-21 MED ORDER — HYDROCORTISONE 1 % EX OINT
1.0000 | TOPICAL_OINTMENT | Freq: Two times a day (BID) | CUTANEOUS | 0 refills | Status: DC
Start: 2024-04-21 — End: 2024-08-07

## 2024-04-25 ENCOUNTER — Ambulatory Visit: Payer: Self-pay | Admitting: Obstetrics

## 2024-04-25 LAB — CERVICOVAGINAL ANCILLARY ONLY
Bacterial Vaginitis (gardnerella): POSITIVE — AB
Candida Glabrata: NEGATIVE
Candida Vaginitis: NEGATIVE
Comment: NEGATIVE
Comment: NEGATIVE
Comment: NEGATIVE

## 2024-04-26 ENCOUNTER — Ambulatory Visit: Payer: Self-pay | Admitting: Certified Nurse Midwife

## 2024-04-26 ENCOUNTER — Other Ambulatory Visit: Payer: Self-pay | Admitting: Certified Nurse Midwife

## 2024-04-26 MED ORDER — METRONIDAZOLE 500 MG PO TABS: 500.0000 mg | ORAL_TABLET | Freq: Two times a day (BID) | ORAL | 0 refills | Status: AC

## 2024-04-27 ENCOUNTER — Ambulatory Visit: Admitting: Dermatology

## 2024-05-11 ENCOUNTER — Encounter: Payer: Self-pay | Admitting: Nurse Practitioner

## 2024-05-11 ENCOUNTER — Ambulatory Visit (INDEPENDENT_AMBULATORY_CARE_PROVIDER_SITE_OTHER): Admitting: Nurse Practitioner

## 2024-05-11 VITALS — BP 112/78 | HR 101 | Temp 98.6°F | Resp 16 | Ht 65.51 in | Wt 137.4 lb

## 2024-05-11 DIAGNOSIS — M359 Systemic involvement of connective tissue, unspecified: Secondary | ICD-10-CM

## 2024-05-11 DIAGNOSIS — Z0000 Encounter for general adult medical examination without abnormal findings: Secondary | ICD-10-CM

## 2024-05-11 DIAGNOSIS — Z7189 Other specified counseling: Secondary | ICD-10-CM

## 2024-05-11 DIAGNOSIS — Z780 Asymptomatic menopausal state: Secondary | ICD-10-CM

## 2024-05-11 NOTE — Assessment & Plan Note (Addendum)
 A voluntary discussion about advance care planning including the explanation and discussion of advance directives was extensively discussed with the patient for 5 minutes with patient.  Explanation about the health care proxy and Living will was reviewed and packet with forms with explanation of how to fill them out was given.  During this discussion, the patient was able to identify a health care proxy as her husband and plans to fill out the paperwork required.  Patient was offered a separate Advance Care Planning visit for further assistance with forms.

## 2024-05-11 NOTE — Assessment & Plan Note (Signed)
 Chronic.  Improved with Rinvoq .  Continue with PRN Gabapentin  and Hydroxyzine .  Continue to follow up with specialist. Reviewed specialist note.

## 2024-05-11 NOTE — Progress Notes (Signed)
 BP 112/78 (BP Location: Right Arm, Patient Position: Sitting, Cuff Size: Normal)   Pulse (!) 101   Temp 98.6 F (37 C) (Oral)   Resp 16   Ht 5' 5.51 (1.664 m)   Wt 137 lb 6.4 oz (62.3 kg)   LMP 04/21/2014 (Approximate)   SpO2 99%   BMI 22.51 kg/m    Subjective:    Patient ID: Frances Davidson, female    DOB: [birth year], 63 y.o.   MRN: [id number]  HPI: Frances Davidson is a 63 y.o. female  Chief Complaint  Patient presents with   Welcome to Medicare   Patient states she is having trouble remembering things.  Feels like this is due to having her grand kids and great grand kids living with her.   She is now on Rinvoq  for her connective tissue disorder.  She is no longer itching and having irritated skin.  She does still have some skin discoloration.  She is using Gabapentin  and Hydroxyzine .       05/11/2024   10:48 AM  6CIT Screen  What Year? 0 points  What month? 0 points  What time? 0 points  Count back from 20 0 points  Months in reverse 0 points  Repeat phrase 0 points  Total Score 0 points     Relevant past medical, surgical, family and social history reviewed and updated as indicated. Interim medical history since our last visit reviewed. Allergies and medications reviewed and updated.  Review of Systems  Skin:  Positive for color change. Negative for rash.    Per HPI unless specifically indicated above     Objective:    BP 112/78 (BP Location: Right Arm, Patient Position: Sitting, Cuff Size: Normal)   Pulse (!) 101   Temp 98.6 F (37 C) (Oral)   Resp 16   Ht 5' 5.51 (1.664 m)   Wt 137 lb 6.4 oz (62.3 kg)   LMP 04/21/2014 (Approximate)   SpO2 99%   BMI 22.51 kg/m   Wt Readings from Last 3 Encounters:  05/11/24 137 lb 6.4 oz (62.3 kg)  04/21/24 141 lb 14.4 oz (64.4 kg)  04/07/24 140 lb (63.5 kg)    Physical Exam Vitals and nursing note reviewed.  Constitutional:      General: She is not in acute distress.    Appearance: Normal appearance.  She is normal weight. She is not ill-appearing, toxic-appearing or diaphoretic.  HENT:     Head: Normocephalic.     Right Ear: External ear normal.     Left Ear: External ear normal.     Nose: Nose normal.     Mouth/Throat:     Mouth: Mucous membranes are moist.     Pharynx: Oropharynx is clear.  Eyes:     General:        Right eye: No discharge.        Left eye: No discharge.     Extraocular Movements: Extraocular movements intact.     Conjunctiva/sclera: Conjunctivae normal.     Pupils: Pupils are equal, round, and reactive to light.  Cardiovascular:     Rate and Rhythm: Normal rate and regular rhythm.     Heart sounds: No murmur heard. Pulmonary:     Effort: Pulmonary effort is normal. No respiratory distress.     Breath sounds: Normal breath sounds. No wheezing or rales.  Musculoskeletal:     Cervical back: Normal range of motion and neck supple.  Skin:    General: Skin is warm  and dry.     Capillary Refill: Capillary refill takes less than 2 seconds.  Neurological:     General: No focal deficit present.     Mental Status: She is alert and oriented to person, place, and time. Mental status is at baseline.  Psychiatric:        Mood and Affect: Mood normal.        Behavior: Behavior normal.        Thought Content: Thought content normal.        Judgment: Judgment normal.     Results for orders placed or performed in visit on 04/21/24  Cervicovaginal ancillary only   Collection Time: 04/21/24  1:34 PM  Result Value Ref Range   Bacterial Vaginitis (gardnerella) Positive (A)    Candida Vaginitis Negative    Candida Glabrata Negative    Comment      Normal Reference Range Bacterial Vaginosis - Negative   Comment Normal Reference Range Candida Species - Negative    Comment Normal Reference Range Candida Galbrata - Negative       Assessment & Plan:   Problem List Items Addressed This Visit       Other   Undifferentiated connective tissue disease (HCC)   Chronic.   Improved with Rinvoq .  Continue with PRN Gabapentin  and Hydroxyzine .  Continue to follow up with specialist. Reviewed specialist note.       Advanced care planning/counseling discussion   A voluntary discussion about advance care planning including the explanation and discussion of advance directives was extensively discussed with the patient for 5 minutes with patient.  Explanation about the health care proxy and Living will was reviewed and packet with forms with explanation of how to fill them out was given.  During this discussion, the patient was able to identify a health care proxy as her husband and plans to fill out the paperwork required.  Patient was offered a separate Advance Care Planning visit for further assistance with forms.         Other Visit Diagnoses       Welcome to Medicare preventive visit    -  Primary   EKG reviewed during visit and NSR.   Relevant Orders   EKG 12-Lead     Post-menopause       Bone density ordered.   Relevant Orders   DG Bone Density        Follow up plan: Return in 3 months (on 08/11/2024) for HTN, HLD, DM2 FU.

## 2024-05-11 NOTE — Progress Notes (Signed)
 Subjective:   Frances Davidson is a 63 y.o. female who presents for Medicare Annual (Subsequent) preventive examination.  Visit Complete: In person  Patient Medicare AWV questionnaire was completed by the patient on 05/11/24; I have confirmed that all information answered by patient is correct and no changes since this date.  Cardiac Risk Factors include: advanced age (>63men, >40 women);sedentary lifestyle;family history of premature cardiovascular disease  EKG:  normal EKG, normal sinus rhythm, unchanged from previous tracings    Objective:    Today's Vitals   05/11/24 1037 05/11/24 1041  BP: 112/78   Pulse: (!) 101   Resp: 16   Temp: 98.6 F (37 C)   TempSrc: Oral   SpO2: 99%   Weight: 137 lb 6.4 oz (62.3 kg)   Height: 5' 5.51 (1.664 m)   PainSc: 0-No pain 0-No pain   Body mass index is 22.51 kg/m.     05/11/2024   10:45 AM 03/11/2024   11:00 PM 03/11/2024    3:29 PM 01/04/2024    8:14 AM 12/28/2022    9:25 AM 07/22/2021    9:20 AM 10/21/2016   10:47 AM  Advanced Directives  Does Patient Have a Medical Advance Directive? No No No No No No No   Would patient like information on creating a medical advance directive? Yes (MAU/Ambulatory/Procedural Areas - Information given) No - Patient declined  No - Patient declined No - Patient declined No - Patient declined No - Patient declined      Data saved with a previous flowsheet row definition    Current Medications (verified) Outpatient Encounter Medications as of 05/11/2024  Medication Sig   atorvastatin  (LIPITOR) 20 MG tablet Take 1 tablet (20 mg total) by mouth daily.   Calcium  Carbonate-Vitamin D (CALCIUM -VITAMIN D) 600-3.125 MG-MCG TABS Take 1 capsule by mouth daily.   cetirizine  (ZYRTEC ) 10 MG tablet Take 1 tablet (10 mg total) by mouth daily.   clobetasol  ointment (TEMOVATE ) 0.05 % Apply 1 Application topically 2 (two) times daily. To affected areas body until smooth. Avoid applying to face, groin, and axilla. Use  as directed. Long-term use can cause thinning of the skin.   gabapentin  (NEURONTIN ) 300 MG capsule Take 1 capsule (300 mg total) by mouth at bedtime.   hydrocortisone  1 % ointment Apply 1 Application topically 2 (two) times daily.   hydroxychloroquine (PLAQUENIL) 200 MG tablet TAKE 1 TABLET BY MOUTH TWICE A DAY ON TUESDAY, THURSDAY, SATURDAY, SUNDAY AND TAKE 1 TABLET ON ALL OTHER DAYS .TAKE WITH FOOD   hydrOXYzine  (VISTARIL ) 25 MG capsule Take 1 capsule (25 mg total) by mouth every 8 (eight) hours as needed.   Multiple Vitamin (MULTIVITAMIN) capsule Take 1 capsule by mouth daily.   omeprazole  (PRILOSEC  OTC) 20 MG tablet Take 1 tablet (20 mg total) by mouth daily.   triamcinolone  ointment (KENALOG ) 0.1 % Apply twice daily to affected areas on body   Upadacitinib  ER 15 MG TB24 Take 1 tablet (15 mg total) by mouth daily.   Vibegron  (GEMTESA ) 75 MG TABS Take 1 tablet (75 mg total) by mouth daily.   [DISCONTINUED] amoxicillin  (AMOXIL ) 500 MG capsule Take 1 capsule (500 mg total) by mouth 3 (three) times daily.   [DISCONTINUED] hydrocortisone  2.5 % cream Apply topically 2 (two) times daily as needed (Rash). Bid to aa eczem until smooth on face, axilla, groin, inframammary (Patient not taking: Reported on 04/07/2024)   [DISCONTINUED] pimecrolimus  (ELIDEL ) 1 % cream APPLY TO THE AFFECTED AREA(S) 2 TIMES A DAY (Patient  not taking: Reported on 04/07/2024)   [DISCONTINUED] triamcinolone  cream (KENALOG ) 0.1 % Apply 1 Application topically 2 (two) times daily. Bid to aa eczema on body until smooth, avoid using on face, groin, axilla (Patient not taking: Reported on 04/07/2024)   No facility-administered encounter medications on file as of 05/11/2024.    Allergies (verified) Patient has no known allergies.   History: Past Medical History:  Diagnosis Date   Anemia    Anxiety    Arthritis    Blood transfusion without reported diagnosis    Cataract    Depression    GERD (gastroesophageal reflux disease)     Glaucoma    Hyperlipidemia    Osteoporosis    Ruptured appendix 07/2016   Sleep apnea    Undifferentiated connective tissue disease (HCC)    Past Surgical History:  Procedure Laterality Date   APPENDECTOMY     BIOPSY OF SKIN SUBCUTANEOUS TISSUE AND/OR MUCOUS MEMBRANE  01/04/2024   Procedure: BIOPSY, SKIN, SUBCUTANEOUS TISSUE, OR MUCOUS MEMBRANE;  Surgeon: Therisa Bi, MD;  Location: [facility] ENDOSCOPY;  Service: Gastroenterology;;   BREAST EXCISIONAL BIOPSY Left 1976   COLONOSCOPY N/A 01/04/2024   Procedure: COLONOSCOPY;  Surgeon: Therisa Bi, MD;  Location: [facility] ENDOSCOPY;  Service: Gastroenterology;  Laterality: N/A;   COLONOSCOPY WITH PROPOFOL  N/A 07/22/2021   Procedure: COLONOSCOPY WITH PROPOFOL ;  Surgeon: Janalyn Keene NOVAK, MD;  Location: [facility] ENDOSCOPY;  Service: Endoscopy;  Laterality: N/A;   ESOPHAGOGASTRODUODENOSCOPY N/A 07/22/2021   Procedure: ESOPHAGOGASTRODUODENOSCOPY (EGD);  Surgeon: Janalyn Keene NOVAK, MD;  Location: [facility] ENDOSCOPY;  Service: Endoscopy;  Laterality: N/A;   LAPAROSCOPIC APPENDECTOMY N/A 10/27/2016   Procedure: APPENDECTOMY LAPAROSCOPIC;  Surgeon: Aloysius Plant, MD;  Location: [facility] ORS;  Service: General;  Laterality: N/A;   TUBAL LIGATION  1985   Family History  Problem Relation Age of Onset   Diabetes Mother    Bladder Cancer Mother    Cervical cancer Mother    Thyroid cancer Mother    Lung cancer Father    Hypertension Father    Hypertension Brother    Hypertension Daughter    Hypertension Son    Diabetes Son    Cancer Maternal Grandmother    Diabetes Maternal Grandmother    Cancer Maternal Grandfather    Diabetes Maternal Grandfather    Cancer Paternal Grandmother    Diabetes Paternal Grandmother    Cancer Paternal Grandfather    Diabetes Paternal Grandfather    Lymphoma Brother    Breast cancer Neg Hx    Social History   Socioeconomic History   Marital status: Married    Spouse name: Frances Davidson   Number of children: 3   Years of  education: Not on file   Highest education level: Associate degree: occupational, Scientist, product/process development, or vocational program  Occupational History   Not on file  Tobacco Use   Smoking status: Never    Passive exposure: Never   Smokeless tobacco: Never  Vaping Use   Vaping status: Never Used  Substance and Sexual Activity   Alcohol use: Not Currently    Comment: occasional   Drug use: Never   Sexual activity: Yes    Birth control/protection: Post-menopausal  Other Topics Concern   Not on file  Social History Narrative   ** Merged History Encounter **       Social Drivers of Health   Financial Resource Strain: Low Risk  (05/07/2024)   Overall Financial Resource Strain (CARDIA)    Difficulty of Paying Living Expenses: Not very hard  Recent  Concern: Financial Resource Strain - High Risk (02/07/2024)   Overall Financial Resource Strain (CARDIA)    Difficulty of Paying Living Expenses: Hard  Food Insecurity: Food Insecurity Present (05/07/2024)   Hunger Vital Sign    Worried About Running Out of Food in the Last Year: Sometimes true    Ran Out of Food in the Last Year: Sometimes true  Transportation Needs: No Transportation Needs (05/07/2024)   PRAPARE - Administrator, Civil Service (Medical): No    Lack of Transportation (Non-Medical): No  Physical Activity: Inactive (05/07/2024)   Exercise Vital Sign    Days of Exercise per Week: 0 days    Minutes of Exercise per Session: Not on file  Stress: No Stress Concern Present (05/07/2024)   [employer] of Occupational Health - Occupational Stress Questionnaire    Feeling of Stress: Not at all  Recent Concern: Stress - Stress Concern Present (02/07/2024)   [employer] of Occupational Health - Occupational Stress Questionnaire    Feeling of Stress : To some extent  Social Connections: Moderately Integrated (05/07/2024)   Social Connection and Isolation Panel    Frequency of Communication with Friends and Family: Twice a week     Frequency of Social Gatherings with Friends and Family: Once a week    Attends Religious Services: 1 to 4 times per year    Active Member of [employer] or Organizations: No    Attends Engineer, structural: Not on file    Marital Status: Married  Recent Concern: Social Connections - Socially Isolated (02/07/2024)   Social Connection and Isolation Panel    Frequency of Communication with Friends and Family: Twice a week    Frequency of Social Gatherings with Friends and Family: Never    Attends Religious Services: Never    Database administrator or Organizations: No    Attends Engineer, structural: Not on file    Marital Status: Married    Tobacco Counseling Counseling given: Not Answered   Clinical Intake:  Pre-visit preparation completed: No  Pain : No/denies pain Pain Score: 0-No pain     BMI - recorded: 22.51 Nutritional Status: BMI of 19-24  Normal Nutritional Risks: None Diabetes: No  How often do you need to have someone help you when you read instructions, pamphlets, or other written materials from your doctor or pharmacy?: 1 - Never  Interpreter Needed?: No      Activities of Daily Living    05/11/2024   10:41 AM 05/07/2024    9:46 AM  In your present state of health, do you have any difficulty performing the following activities:  Hearing? 1 0  Comment Yes at times   Vision? 1 1  Comment Worse   Difficulty concentrating or making decisions? 1 1  Walking or climbing stairs? 0 0  Dressing or bathing? 0 0  Doing errands, shopping? 0 0  Preparing Food and eating ? N N  Using the Toilet? N N  In the past six months, have you accidently leaked urine? N N  Do you have problems with loss of bowel control? N N  Managing your Medications? N N  Managing your Finances? N N  Housekeeping or managing your Housekeeping? N N    Patient Care Team: Melvin Pao, NP as PCP - General (Nurse Practitioner) Driscilla Wanda SQUIBB, RN Harl Eleanor LABOR, NP as Nurse Practitioner (Hematology and Oncology)  Indicate any recent Medical Services you may have received from other than  [facility] providers in the past year (date may be approximate).     Assessment:   This is a routine wellness examination for Luanne.  Hearing/Vision screen Vision Screening   Right eye Left eye Both eyes  Without correction 20/70 20/70 20/70   With correction        Goals Addressed               This Visit's Progress     DIET - INCREASE WATER INTAKE (pt-stated)        Strengthen support to assist with stress   On track     Timeframe:  Long-Range Goal Priority:  High Start Date:   03/28/21                          Expected End Date:  08/20/21                     Follow Up Date 8-12 weeks   Patient Goals/Self-Care Activities: Over the next 120 days Attend scheduled appointments Contact clinic with any questions or concerns Complete CAFA application  Utilize strategies discussed to assist with management of symptoms       Depression Screen    05/11/2024   10:48 AM 08/09/2023    2:03 PM 05/06/2023    1:52 PM 01/26/2023    1:17 PM 10/28/2022    1:18 PM 04/24/2022    2:16 PM 04/07/2022   10:31 AM  PHQ 2/9 Scores  PHQ - 2 Score 0 0 0 0 0 0 0  PHQ- 9 Score  0 0 0 0 0 0    Fall Risk    05/11/2024   10:47 AM 05/07/2024    9:46 AM 02/09/2024    1:14 PM 05/25/2023   10:55 AM 05/06/2023    1:52 PM  Fall Risk   Falls in the past year? 0 0 0 0 0  Number falls in past yr: 0  0 0 0  Injury with Fall? 0  0 0 0  Risk for fall due to : No Fall Risks  No Fall Risks No Fall Risks No Fall Risks  Follow up Falls evaluation completed  Falls evaluation completed Falls evaluation completed Falls evaluation completed    MEDICARE RISK AT HOME: Medicare Risk at Home Any stairs in or around the home?: No If so, are there any without handrails?: No Home free of loose throw rugs in walkways, pet beds, electrical cords, etc?: No Adequate lighting in your home  to reduce risk of falls?: Yes Life alert?: No Use of a cane, walker or w/c?: Yes (Random times with cane) Grab bars in the bathroom?: No Shower chair or bench in shower?: No Elevated toilet seat or a handicapped toilet?: No  TIMED UP AND GO:  Was the test performed?  Yes  Length of time to ambulate 10 feet: 10 sec Gait steady and fast with assistive device    Cognitive Function:        05/11/2024   10:48 AM  6CIT Screen  What Year? 0 points  What month? 0 points  What time? 0 points  Count back from 20 0 points  Months in reverse 0 points  Repeat phrase 0 points  Total Score 0 points    Immunizations Immunization History  Administered Date(s) Administered   Influenza, Seasonal, Injecte, Preservative Fre 08/09/2023   PFIZER(Purple Top)SARS-COV-2 Vaccination 11/26/2019, 12/19/2019, 09/05/2020   PNEUMOCOCCAL CONJUGATE-20 10/03/2023    TDAP  status: Due, Education has been provided regarding the importance of this vaccine. Advised may receive this vaccine at local pharmacy or Health Dept. Aware to provide a copy of the vaccination record if obtained from local pharmacy or Health Dept. Verbalized acceptance and understanding.  Flu Vaccine status: Up to date  Pneumococcal vaccine status: Up to date  Covid-19 vaccine status: Completed vaccines  Qualifies for Shingles Vaccine? Yes   Zostavax completed Yes   Shingrix Completed?: Yes  Screening Tests Health Maintenance  Topic Date Due   DTaP/Tdap/Td (1 - Tdap) Never done   DEXA SCAN  Never done   INFLUENZA VACCINE  05/22/2024 (Originally 04/21/2024)   COVID-19 Vaccine (4 - 2024-25 season) 05/27/2024 (Originally 05/23/2023)   Zoster Vaccines- Shingrix (2 of 2) 06/05/2024   Medicare Annual Wellness (AWV)  05/11/2025   MAMMOGRAM  09/02/2025   Cervical Cancer Screening (HPV/Pap Cotest)  02/18/2026   Colonoscopy  01/03/2034   Pneumococcal Vaccine: 50+ Years  Completed   Hepatitis B Vaccines 19-59 Average Risk  Completed    Hepatitis C Screening  Completed   HIV Screening  Completed   HPV VACCINES  Aged Out   Meningococcal B Vaccine  Aged Out    Health Maintenance  Health Maintenance Due  Topic Date Due   DTaP/Tdap/Td (1 - Tdap) Never done   DEXA SCAN  Never done    Colorectal cancer screening: Type of screening: Colonoscopy. Completed 01/04/2024. Repeat every 10 years  Mammogram status: Completed 07/04/2023. Repeat every year  Bone Density status: Ordered 05/11/2024. Pt provided with contact info and advised to call to schedule appt.  Additional Screening:  Hepatitis C Screening: does qualify; Completed 10/28/2022.  Vision Screening: Recommended annual ophthalmology exams for early detection of glaucoma and other disorders of the eye. Is the patient up to date with their annual eye exam?  No  Who is the provider or what is the name of the office in which the patient attends annual eye exams? N/A but advised to schedule based on failed vision screening.  If pt is not established with a provider, would they like to be referred to a provider to establish care? No .   Dental Screening: Recommended annual dental exams for proper oral hygiene  Community Resource Referral / Chronic Care Management: CRR required this visit?  No   CCM required this visit?  No     Plan:     I have personally reviewed and noted the following in the patient's chart:   Medical and social history Use of alcohol, tobacco or illicit drugs  Current medications and supplements including opioid prescriptions. Patient is not currently taking opioid prescriptions. Functional ability and status Nutritional status Physical activity Advanced directives List of other physicians Hospitalizations, surgeries, and ER visits in previous 12 months Vitals Screenings to include cognitive, depression, and falls Referrals and appointments  In addition, I have reviewed and discussed with patient certain preventive protocols,  quality metrics, and best practice recommendations. A written personalized care plan for preventive services as well as general preventive health recommendations were provided to patient.     Comer HERO Airyonna Franklyn, CMA   05/11/2024   After Visit Summary: (In Person-Printed) AVS printed and given to the patient

## 2024-05-11 NOTE — Progress Notes (Deleted)
 Subjective:    Frances Davidson is a 63 y.o. female who presents for a Welcome to Medicare exam.   Cardiac Risk Factors include: advanced age (>39men, >14 women);sedentary lifestyle;family history of premature cardiovascular disease      Objective:    Today's Vitals   05/11/24 1037 05/11/24 1041  BP: 112/78   Pulse: (!) 101   Resp: 16   Temp: 98.6 F (37 C)   TempSrc: Oral   SpO2: 99%   Weight: 137 lb 6.4 oz (62.3 kg)   Height: 5' 5.51 (1.664 m)   PainSc: 0-No pain 0-No pain  Body mass index is 22.51 kg/m.  Medications Outpatient Encounter Medications as of 05/11/2024  Medication Sig   atorvastatin  (LIPITOR) 20 MG tablet Take 1 tablet (20 mg total) by mouth daily.   Calcium  Carbonate-Vitamin D (CALCIUM -VITAMIN D) 600-3.125 MG-MCG TABS Take 1 capsule by mouth daily.   cetirizine  (ZYRTEC ) 10 MG tablet Take 1 tablet (10 mg total) by mouth daily.   clobetasol  ointment (TEMOVATE ) 0.05 % Apply 1 Application topically 2 (two) times daily. To affected areas body until smooth. Avoid applying to face, groin, and axilla. Use as directed. Long-term use can cause thinning of the skin.   gabapentin  (NEURONTIN ) 300 MG capsule Take 1 capsule (300 mg total) by mouth at bedtime.   hydrocortisone  1 % ointment Apply 1 Application topically 2 (two) times daily.   hydroxychloroquine (PLAQUENIL) 200 MG tablet TAKE 1 TABLET BY MOUTH TWICE A DAY ON TUESDAY, THURSDAY, SATURDAY, SUNDAY AND TAKE 1 TABLET ON ALL OTHER DAYS .TAKE WITH FOOD   hydrOXYzine  (VISTARIL ) 25 MG capsule Take 1 capsule (25 mg total) by mouth every 8 (eight) hours as needed.   Multiple Vitamin (MULTIVITAMIN) capsule Take 1 capsule by mouth daily.   omeprazole  (PRILOSEC  OTC) 20 MG tablet Take 1 tablet (20 mg total) by mouth daily.   triamcinolone  ointment (KENALOG ) 0.1 % Apply twice daily to affected areas on body   Upadacitinib  ER 15 MG TB24 Take 1 tablet (15 mg total) by mouth daily.   Vibegron  (GEMTESA ) 75 MG TABS Take 1 tablet  (75 mg total) by mouth daily.   [DISCONTINUED] amoxicillin  (AMOXIL ) 500 MG capsule Take 1 capsule (500 mg total) by mouth 3 (three) times daily.   [DISCONTINUED] hydrocortisone  2.5 % cream Apply topically 2 (two) times daily as needed (Rash). Bid to aa eczem until smooth on face, axilla, groin, inframammary (Patient not taking: Reported on 04/07/2024)   [DISCONTINUED] pimecrolimus  (ELIDEL ) 1 % cream APPLY TO THE AFFECTED AREA(S) 2 TIMES A DAY (Patient not taking: Reported on 04/07/2024)   [DISCONTINUED] triamcinolone  cream (KENALOG ) 0.1 % Apply 1 Application topically 2 (two) times daily. Bid to aa eczema on body until smooth, avoid using on face, groin, axilla (Patient not taking: Reported on 04/07/2024)   No facility-administered encounter medications on file as of 05/11/2024.     History: Past Medical History:  Diagnosis Date   Anemia    Anxiety    Arthritis    Blood transfusion without reported diagnosis    Cataract    Depression    GERD (gastroesophageal reflux disease)    Glaucoma    Hyperlipidemia    Osteoporosis    Ruptured appendix 07/2016   Sleep apnea    Undifferentiated connective tissue disease (HCC)    Past Surgical History:  Procedure Laterality Date   APPENDECTOMY     BIOPSY OF SKIN SUBCUTANEOUS TISSUE AND/OR MUCOUS MEMBRANE  01/04/2024   Procedure: BIOPSY, SKIN, SUBCUTANEOUS  TISSUE, OR MUCOUS MEMBRANE;  Surgeon: Therisa Bi, MD;  Location: [facility] ENDOSCOPY;  Service: Gastroenterology;;   BREAST EXCISIONAL BIOPSY Left 1976   COLONOSCOPY N/A 01/04/2024   Procedure: COLONOSCOPY;  Surgeon: Therisa Bi, MD;  Location: [facility] ENDOSCOPY;  Service: Gastroenterology;  Laterality: N/A;   COLONOSCOPY WITH PROPOFOL  N/A 07/22/2021   Procedure: COLONOSCOPY WITH PROPOFOL ;  Surgeon: Janalyn Keene NOVAK, MD;  Location: [facility] ENDOSCOPY;  Service: Endoscopy;  Laterality: N/A;   ESOPHAGOGASTRODUODENOSCOPY N/A 07/22/2021   Procedure: ESOPHAGOGASTRODUODENOSCOPY (EGD);  Surgeon: Janalyn Keene NOVAK, MD;  Location: [facility] ENDOSCOPY;  Service: Endoscopy;  Laterality: N/A;   LAPAROSCOPIC APPENDECTOMY N/A 10/27/2016   Procedure: APPENDECTOMY LAPAROSCOPIC;  Surgeon: Aloysius Plant, MD;  Location: [facility] ORS;  Service: General;  Laterality: N/A;   TUBAL LIGATION  1985    Family History  Problem Relation Age of Onset   Diabetes Mother    Bladder Cancer Mother    Cervical cancer Mother    Thyroid cancer Mother    Lung cancer Father    Hypertension Father    Hypertension Brother    Hypertension Daughter    Hypertension Son    Diabetes Son    Cancer Maternal Grandmother    Diabetes Maternal Grandmother    Cancer Maternal Grandfather    Diabetes Maternal Grandfather    Cancer Paternal Grandmother    Diabetes Paternal Grandmother    Cancer Paternal Grandfather    Diabetes Paternal Grandfather    Lymphoma Brother    Breast cancer Neg Hx    Social History   Occupational History   Not on file  Tobacco Use   Smoking status: Never    Passive exposure: Never   Smokeless tobacco: Never  Vaping Use   Vaping status: Never Used  Substance and Sexual Activity   Alcohol use: Not Currently    Comment: occasional   Drug use: Never   Sexual activity: Yes    Birth control/protection: Post-menopausal    Tobacco Counseling Counseling given: Not Answered   Immunizations and Health Maintenance Immunization History  Administered Date(s) Administered   Influenza, Seasonal, Injecte, Preservative Fre 08/09/2023   PFIZER(Purple Top)SARS-COV-2 Vaccination 11/26/2019, 12/19/2019, 09/05/2020   PNEUMOCOCCAL CONJUGATE-20 10/03/2023   Health Maintenance Due  Topic Date Due   DTaP/Tdap/Td (1 - Tdap) Never done   DEXA SCAN  Never done    Activities of Daily Living    05/11/2024   10:41 AM 05/07/2024    9:46 AM  In your present state of health, do you have any difficulty performing the following activities:  Hearing? 1 0  Comment Yes at times   Vision? 1 1  Comment Worse    Difficulty concentrating or making decisions? 1 1  Walking or climbing stairs? 0 0  Dressing or bathing? 0 0  Doing errands, shopping? 0 0  Preparing Food and eating ? N N  Using the Toilet? N N  In the past six months, have you accidently leaked urine? N N  Do you have problems with loss of bowel control? N N  Managing your Medications? N N  Managing your Finances? N N  Housekeeping or managing your Housekeeping? N N    Physical Exam   Physical Exam (optional), or other factors deemed appropriate based on the beneficiary's medical and social history and current clinical standards.   Advanced Directives: Does Patient Have a Medical Advance Directive?: No Would patient like information on creating a medical advance directive?: Yes (MAU/Ambulatory/Procedural Areas - Information given)  EKG:  {ekg  findings:315101}      Assessment:    This is a routine wellness examination for this patient . ***  Vision/Hearing screen Vision Screening   Right eye Left eye Both eyes  Without correction 20/70 20/70 20/70   With correction        Goals       DIET - INCREASE WATER INTAKE (pt-stated)      Strengthen support to assist with stress      Timeframe:  Long-Range Goal Priority:  High Start Date:   03/28/21                          Expected End Date:  08/20/21                     Follow Up Date 8-12 weeks   Patient Goals/Self-Care Activities: Over the next 120 days Attend scheduled appointments Contact clinic with any questions or concerns Complete CAFA application  Utilize strategies discussed to assist with management of symptoms        Depression Screen    05/11/2024   10:48 AM 08/09/2023    2:03 PM 05/06/2023    1:52 PM 01/26/2023    1:17 PM  PHQ 2/9 Scores  PHQ - 2 Score 0 0 0 0  PHQ- 9 Score  0 0 0     Fall Risk    05/11/2024   10:47 AM  Fall Risk   Falls in the past year? 0  Number falls in past yr: 0  Injury with Fall? 0  Risk for fall due to : No Fall  Risks  Follow up Falls evaluation completed    Cognitive Function:        05/11/2024   10:48 AM  6CIT Screen  What Year? 0 points  What month? 0 points  What time? 0 points  Count back from 20 0 points  Months in reverse 0 points  Repeat phrase 0 points  Total Score 0 points    Patient Care Team: Melvin Pao, NP as PCP - General (Nurse Practitioner) Driscilla Wanda SQUIBB, RN Harl Eleanor LABOR, NP as Nurse Practitioner (Hematology and Oncology)     Plan:   ***  I have personally reviewed and noted the following in the patient's chart:   Medical and social history Use of alcohol, tobacco or illicit drugs  Current medications and supplements including opioid prescriptions. {Opioid [phone]} Functional ability and status Nutritional status Physical activity Advanced directives List of other physicians Hospitalizations, surgeries, and ER visits in previous 12 months Vitals Screenings to include cognitive, depression, and falls Referrals and appointments  In addition, I have reviewed and discussed with patient certain preventive protocols, quality metrics, and best practice recommendations. A written personalized care plan for preventive services as well as general preventive health recommendations were provided to patient.     Comer HERO Demiya Magno, CMA 05/11/2024

## 2024-05-17 ENCOUNTER — Other Ambulatory Visit: Payer: Self-pay | Admitting: Obstetrics

## 2024-05-17 DIAGNOSIS — N3941 Urge incontinence: Secondary | ICD-10-CM

## 2024-05-17 DIAGNOSIS — R351 Nocturia: Secondary | ICD-10-CM

## 2024-05-23 ENCOUNTER — Ambulatory Visit: Admitting: Obstetrics

## 2024-05-30 ENCOUNTER — Encounter: Payer: Self-pay | Admitting: Obstetrics

## 2024-05-30 ENCOUNTER — Ambulatory Visit (INDEPENDENT_AMBULATORY_CARE_PROVIDER_SITE_OTHER): Admitting: Obstetrics

## 2024-05-30 VITALS — BP 103/69 | HR 93

## 2024-05-30 DIAGNOSIS — R159 Full incontinence of feces: Secondary | ICD-10-CM | POA: Diagnosis not present

## 2024-05-30 DIAGNOSIS — N952 Postmenopausal atrophic vaginitis: Secondary | ICD-10-CM | POA: Diagnosis not present

## 2024-05-30 DIAGNOSIS — N3941 Urge incontinence: Secondary | ICD-10-CM

## 2024-05-30 DIAGNOSIS — N8110 Cystocele, unspecified: Secondary | ICD-10-CM

## 2024-05-30 DIAGNOSIS — R3914 Feeling of incomplete bladder emptying: Secondary | ICD-10-CM | POA: Diagnosis not present

## 2024-05-30 DIAGNOSIS — R351 Nocturia: Secondary | ICD-10-CM

## 2024-05-30 MED ORDER — GEMTESA 75 MG PO TABS: 1.0000 | ORAL_TABLET | Freq: Every day | ORAL | 11 refills | Status: AC

## 2024-05-30 NOTE — Assessment & Plan Note (Signed)
-   02/17/24 PVR 6mL, catheterized for . Repeat PVR 6mL today - repeat if clinical change - Gemtesa  with relief of symptoms, continue with Rx sent

## 2024-05-30 NOTE — Patient Instructions (Addendum)
 Continue Gemtesa .   Please return for change in urinary or vaginal symptoms.

## 2024-05-30 NOTE — Assessment & Plan Note (Signed)
-   resolved since gemtesa  - Treatment options include anti-diarrhea medication (loperamide/ Imodium OTC or prescription lomotil), fiber supplements, physical therapy, and possible sacral neuromodulation or surgery.   - discussed referral for pelvic floor PT, pt declined  - advised to reduce alcohol use - continue Imodium use every 2 days for IBS-D, discussed association of beer consumption with loose stool - consider Trospium  to assess anti-cholinergic effect on bowel consistency if symptoms return

## 2024-05-30 NOTE — Progress Notes (Signed)
 [facility] Return Visit  SUBJECTIVE  History of Present Illness: Frances Davidson is a 63 y.o. female seen in follow-up for urgency urinary incontinence, nocturia, sensation of incomplete emptying, vaginal atrophy, stage II pelvic organ prolapse, and fecal incontinence. Plan at last visit was trial of gemtesa , reduce alcohol consumption, vaginal estrogen, titration of imodium use.   Gemtesa  with relief, cost effective.  UUI 1x/week at night resolved since Gemtesa  Nocturia 2x/night down from 3-4x/night, usually when she drinks beer close to bedtime Drinks 2-4 cans of beers/day, depends on if she is keeping her grandchildren  Reports being happy with decreased urgency and leakage Dislikes vaginal estrogen cream due to odor  Tried size 2 ring with support pessary for grade II pelvic organ prolapse, expelled during straining with constipation  BM 1-3x/day with loose stool, straining, and FI 1x/day resolved since Gemtesa  on Imodium every 2 days  Past Medical History: Patient  has a past medical history of Anemia, Anxiety, Arthritis, Blood transfusion without reported diagnosis, Cataract, Depression, GERD (gastroesophageal reflux disease), Glaucoma, Hyperlipidemia, Osteoporosis, Ruptured appendix (07/2016), Sleep apnea, and Undifferentiated connective tissue disease (HCC).   Past Surgical History: She  has a past surgical history that includes Tubal ligation (1985); laparoscopic appendectomy (N/A, 10/27/2016); Breast excisional biopsy (Left, 1976); Colonoscopy with propofol  (N/A, 07/22/2021); Esophagogastroduodenoscopy (N/A, 07/22/2021); Colonoscopy (N/A, 01/04/2024); Biopsy of skin subcutaneous tissue and/or mucous membrane (01/04/2024); and Appendectomy.   Medications: She has a current medication list which includes the following prescription(s): atorvastatin , calcium -vitamin d, cetirizine , clobetasol  ointment, gabapentin , hydrocortisone , hydroxychloroquine, hydroxyzine ,  multivitamin, omeprazole , triamcinolone  ointment, upadacitinib  er, and gemtesa .   Allergies: Patient has no known allergies.   Social History: Patient  reports that she has never smoked. She has never been exposed to tobacco smoke. She has never used smokeless tobacco. She reports that she does not currently use alcohol. She reports that she does not use drugs.     OBJECTIVE     Physical Exam: Vitals:   05/30/24 1132  BP: 103/69  Pulse: 93   Gen: No apparent distress, A&O x 3.  Detailed Urogynecologic Evaluation:  Deferred.   Post Void Residual - 05/30/24 1146       Post Void Residual   Post Void Residual 6 mL              ASSESSMENT AND PLAN    Frances Davidson is a 63 y.o. with:  1. Incontinence of feces, unspecified fecal incontinence type   2. Pelvic organ prolapse quantification stage 2 cystocele   3. Urge incontinence of urine   4. Nocturia   5. Vaginal atrophy   6. Feeling of incomplete bladder emptying     Incontinence of feces, unspecified fecal incontinence type Assessment & Plan: - resolved since gemtesa  - Treatment options include anti-diarrhea medication (loperamide/ Imodium OTC or prescription lomotil), fiber supplements, physical therapy, and possible sacral neuromodulation or surgery.   - discussed referral for pelvic floor PT, pt declined  - advised to reduce alcohol use - continue Imodium use every 2 days for IBS-D, discussed association of beer consumption with loose stool - consider Trospium  to assess anti-cholinergic effect on bowel consistency if symptoms return  Orders: -     Gemtesa ; Take 1 tablet (75 mg total) by mouth daily.  Dispense: 30 tablet; Refill: 11  Pelvic organ prolapse quantification stage 2 cystocele  Urge incontinence of urine Assessment & Plan: - 02/17/24 POCT UA negative, PVR 6mL - We discussed the symptoms of overactive bladder (OAB), which include urinary  urgency, urinary frequency, nocturia, with or without urge  incontinence.  While we do not know the exact etiology of OAB, several treatment options exist. We discussed management including behavioral therapy (decreasing bladder irritants, urge suppression strategies, timed voids, bladder retraining), physical therapy, medication; for refractory cases posterior tibial nerve stimulation, sacral neuromodulation, and intravesical botulinum toxin injection.  For anticholinergic medications, we discussed the potential side effects of anticholinergics including dry eyes, dry mouth, constipation, cognitive impairment and urinary retention. For Beta-3 agonist medication, we discussed the potential side effect of elevated blood pressure which is more likely to occur in individuals with uncontrolled hypertension. - continue Gemtesa  with Rx provided due to relief - discussed Trospium  if cost prohibitive, consider possible constipation as side effect to help reduce IBS-D symptoms - declines pelvic floor PT - dislikes low dose vaginal estrogen due to odor.  - encouraged to continue to reduce alcohol consumption  - Valsalva during attempt to perform Kegel exercise, advised pt to avoid performing when she has urge to void  Orders: -     Gemtesa ; Take 1 tablet (75 mg total) by mouth daily.  Dispense: 30 tablet; Refill: 11  Nocturia Assessment & Plan: - avoid fluid intake 3 hours before bedtime - reduce alcohol use - gemtesa  with reduction of symptoms - reports snoring, consider sleep study for sleep apnea   Orders: -     Gemtesa ; Take 1 tablet (75 mg total) by mouth daily.  Dispense: 30 tablet; Refill: 11  Vaginal atrophy Assessment & Plan: - For symptomatic vaginal atrophy options include lubrication with a water-based lubricant, personal hygiene measures and barrier protection against wetness, and estrogen replacement in the form of vaginal cream, vaginal tablets, or a time-released vaginal ring.   - dislikes low dose vaginal estrogen cream due to odor -  offered vaginal inserts, patient declines at this time   Feeling of incomplete bladder emptying Assessment & Plan: - 02/17/24 PVR 6mL, catheterized for . Repeat PVR 6mL today - repeat if clinical change - Gemtesa  with relief of symptoms, continue with Rx sent   Time spent: I spent 27 minutes dedicated to the care of this patient on the date of this encounter to include pre-visit review of records, face-to-face time with the patient discussing urgency urinary incontinence, nocturia, sensation of incomplete emptying, vaginal atrophy, stage II pelvic organ prolapse, and fecal incontinence and post visit documentation and ordering medication/ testing.   Lianne ONEIDA Gillis, MD

## 2024-05-30 NOTE — Assessment & Plan Note (Signed)
-   For symptomatic vaginal atrophy options include lubrication with a water-based lubricant, personal hygiene measures and barrier protection against wetness, and estrogen replacement in the form of vaginal cream, vaginal tablets, or a time-released vaginal ring.   - dislikes low dose vaginal estrogen cream due to odor - offered vaginal inserts, patient declines at this time

## 2024-05-30 NOTE — Assessment & Plan Note (Signed)
-   avoid fluid intake 3 hours before bedtime - reduce alcohol use - gemtesa  with reduction of symptoms - reports snoring, consider sleep study for sleep apnea

## 2024-05-30 NOTE — Assessment & Plan Note (Signed)
-   02/17/24 POCT UA negative, PVR 6mL - We discussed the symptoms of overactive bladder (OAB), which include urinary urgency, urinary frequency, nocturia, with or without urge incontinence.  While we do not know the exact etiology of OAB, several treatment options exist. We discussed management including behavioral therapy (decreasing bladder irritants, urge suppression strategies, timed voids, bladder retraining), physical therapy, medication; for refractory cases posterior tibial nerve stimulation, sacral neuromodulation, and intravesical botulinum toxin injection.  For anticholinergic medications, we discussed the potential side effects of anticholinergics including dry eyes, dry mouth, constipation, cognitive impairment and urinary retention. For Beta-3 agonist medication, we discussed the potential side effect of elevated blood pressure which is more likely to occur in individuals with uncontrolled hypertension. - continue Gemtesa  with Rx provided due to relief - discussed Trospium  if cost prohibitive, consider possible constipation as side effect to help reduce IBS-D symptoms - declines pelvic floor PT - dislikes low dose vaginal estrogen due to odor.  - encouraged to continue to reduce alcohol consumption  - Valsalva during attempt to perform Kegel exercise, advised pt to avoid performing when she has urge to void

## 2024-05-31 ENCOUNTER — Ambulatory Visit: Payer: Self-pay | Admitting: Nurse Practitioner

## 2024-06-14 ENCOUNTER — Other Ambulatory Visit: Payer: Self-pay

## 2024-06-14 ENCOUNTER — Emergency Department
Admission: EM | Admit: 2024-06-14 | Discharge: 2024-06-14 | Disposition: A | Discharge status: Home / Self Care | Attending: Emergency Medicine | Admitting: Emergency Medicine

## 2024-06-14 DIAGNOSIS — H9202 Otalgia, left ear: Secondary | ICD-10-CM | POA: Diagnosis present

## 2024-06-14 DIAGNOSIS — H9222 Otorrhagia, left ear: Secondary | ICD-10-CM | POA: Insufficient documentation

## 2024-06-14 MED ORDER — CIPROFLOXACIN-HYDROCORTISONE 0.2-1 % OT SUSP: 3.0000 [drp] | Freq: Two times a day (BID) | OTIC | 0 refills | Status: AC

## 2024-06-14 MED ORDER — ACETAMINOPHEN 325 MG PO TABS
650.0000 mg | ORAL_TABLET | Freq: Once | ORAL | Status: AC
  Administered 2024-06-14: 650 mg via ORAL
  Filled 2024-06-14: qty 2

## 2024-06-14 NOTE — Discharge Instructions (Addendum)
 You were seen in the emergency department for left ear injury with possible eardrum rupture.  Please pick up the antibiotics and take as prescribed.  Please follow-up with the ear nose throat specialist (Dr. Herminio) listed in this paperwork.  Please do not put anything in your ear including Q-tips, water, peroxide or anything else.

## 2024-06-14 NOTE — ED Provider Notes (Signed)
 [facility] Provider Note    Event Date/Time   First MD Initiated Contact with Patient 06/14/24 1156     (approximate)   History   No chief complaint on file.   HPI  Frances Davidson is a 63 y.o. female  with a past medical history of pression, anemia, sleep apnea, GERD, glaucoma, osteoporosis, hyperlipidemia presents to the emergency department with left ear pain and bleeding after an injury sustained with a Q-tip yesterday in the ear around 3 p.m. Patient reports she feels like she has lost some hearing when placing a phone to her left ear. No hx of barotrauma or direct ear trauma prior to this incident.     Physical Exam   Triage Vital Signs: ED Triage Vitals  Encounter Vitals Group     BP 06/14/24 1149 109/76     Girls Systolic BP Percentile --      Girls Diastolic BP Percentile --      Boys Systolic BP Percentile --      Boys Diastolic BP Percentile --      Pulse Rate 06/14/24 1149 (!) 102     Resp 06/14/24 1149 20     Temp 06/14/24 1149 98.9 F (37.2 C)     Temp Source 06/14/24 1149 Oral     SpO2 06/14/24 1149 100 %     Weight 06/14/24 1137 137 lb 5.6 oz (62.3 kg)     Height --      Head Circumference --      Peak Flow --      Pain Score 06/14/24 1137 8     Pain Loc --      Pain Education --      Exclude from Growth Chart --     Most recent vital signs: Vitals:   06/14/24 1149  BP: 109/76  Pulse: (!) 102  Resp: 20  Temp: 98.9 F (37.2 C)  SpO2: 100%    General: Awake, in no acute distress. Appears stated age. Ears/Nose/Throat: Bright red blood noted from 2 'o clock to 6 'o clock position outside the left tympanic membrane. Obvious perforation not visualized. No pain with palpation of the tragus or pinna. No postauricular swelling or skin changes. Nares patent, no nasal discharge. Oropharynx moist, no erythema or exudate. Dentition intact. Neck: Supple, no lymphadenopathy CV: Good peripheral perfusion.  Respiratory:Normal  respiratory effort.  No respiratory distress.  GI: Soft, non-distended.    ED Results / Procedures / Treatments   Labs (all labs ordered are listed, but only abnormal results are displayed) Labs Reviewed - No data to display   EKG     RADIOLOGY    PROCEDURES:  Critical Care performed: No   Procedures   MEDICATIONS ORDERED IN ED: Medications  acetaminophen  (TYLENOL ) tablet 650 mg (650 mg Oral Given 06/14/24 1223)     IMPRESSION / MDM / ASSESSMENT AND PLAN / ED COURSE  I reviewed the triage vital signs and the nursing notes.                              Differential diagnosis includes, but is not limited to, panic membrane perforation/barotrauma, otitis media, mastoiditis  Patient's presentation is most consistent with acute complicated illness / injury requiring diagnostic workup.  Patient is a 63 year old female presenting with left ear injury after Q-tip use in her left ear.  Physical exam shows blood present from the 2-6 o' clock positions in  front of the left tympanic membrane but no perforation seen.  Patient is afebrile, started experiencing pain when physical exam was completed.  Did give her a dose of Tylenol  here.  She is able to hear out of her left ear when closing the right ear on exam. Will send her home with ciprofloxacin -hydrocortisone  eardrop prescription and follow-up with ENT outpatient.  She may use Tylenol  as needed for her pain at home.  The patient may return to the emergency department for any new, worsening, or concerning symptoms. Patient was given the opportunity to ask questions; all questions were answered. Emergency department return precautions were discussed with the patient.  Patient is in agreement to the treatment plan.  Patient is stable for discharge.    FINAL CLINICAL IMPRESSION(S) / ED DIAGNOSES   Final diagnoses:  Bleeding from left ear     Rx / DC Orders   ED Discharge Orders          Ordered     ciprofloxacin -hydrocortisone  (CIPRO  HC OTIC) OTIC suspension  2 times daily        06/14/24 1223             Note:  This document was prepared using Dragon voice recognition software and may include unintentional dictation errors.     Sheron Salm, PA-C 06/14/24 1239    Levander Slate, MD 06/14/24 [phone]

## 2024-06-14 NOTE — ED Triage Notes (Signed)
 Poked left ear with a q-tip and had bleeding last night x 3 hours. C/O left ear pain.

## 2024-07-03 ENCOUNTER — Encounter: Payer: Self-pay | Admitting: Dermatology

## 2024-07-03 ENCOUNTER — Other Ambulatory Visit: Payer: Self-pay

## 2024-07-03 ENCOUNTER — Ambulatory Visit (INDEPENDENT_AMBULATORY_CARE_PROVIDER_SITE_OTHER): Admitting: Dermatology

## 2024-07-03 DIAGNOSIS — L209 Atopic dermatitis, unspecified: Secondary | ICD-10-CM

## 2024-07-03 DIAGNOSIS — Z79899 Other long term (current) drug therapy: Secondary | ICD-10-CM

## 2024-07-03 DIAGNOSIS — L281 Prurigo nodularis: Secondary | ICD-10-CM

## 2024-07-03 DIAGNOSIS — Z7189 Other specified counseling: Secondary | ICD-10-CM

## 2024-07-03 MED ORDER — UPADACITINIB ER 15 MG PO TB24: 1.0000 | ORAL_TABLET | Freq: Every day | ORAL | 2 refills | Status: DC

## 2024-07-03 NOTE — Patient Instructions (Signed)
 Continue Rinvoq  15 mg daily as directed.     Gentle Skin Care Guide  1. Bathe no more than once a day.  2. Avoid bathing in hot water  3. Use a mild soap like Dove, Vanicream, Cetaphil, CeraVe. Can use Lever 2000 or Cetaphil antibacterial soap  4. Use soap only where you need it. On most days, use it under your arms, between your legs, and on your feet. Let the water rinse other areas unless visibly dirty.  5. When you get out of the bath/shower, use a towel to gently blot your skin dry, don't rub it.  6. While your skin is still a little damp, apply a moisturizing cream such as Vanicream, CeraVe, Cetaphil, Eucerin, Sarna lotion or plain Vaseline Jelly. For hands apply Neutrogena [place] Hand Cream or Excipial Hand Cream.  7. Reapply moisturizer any time you start to itch or feel dry.  8. Sometimes using free and clear laundry detergents can be helpful. Fabric softener sheets should be avoided. Downy Free & Gentle liquid, or any liquid fabric softener that is free of dyes and perfumes, it acceptable to use  9. If your doctor has given you prescription creams you may apply moisturizers over them        Due to recent changes in healthcare laws, you may see results of your pathology and/or laboratory studies on MyChart before the doctors have had a chance to review them. We understand that in some cases there may be results that are confusing or concerning to you. Please understand that not all results are received at the same time and often the doctors may need to interpret multiple results in order to provide you with the best plan of care or course of treatment. Therefore, we ask that you please give us  2 business days to thoroughly review all your results before contacting the office for clarification. Should we see a critical lab result, you will be contacted sooner.   If You Need Anything After Your Visit  If you have any questions or concerns for your doctor, please call our  main line at [phone] and press option 4 to reach your doctor's medical assistant. If no one answers, please leave a voicemail as directed and we will return your call as soon as possible. Messages left after 4 pm will be answered the following business day.   You may also send us  a message via MyChart. We typically respond to MyChart messages within 1-2 business days.  For prescription refills, please ask your pharmacy to contact our office. Our fax number is [phone].  If you have an urgent issue when the clinic is closed that cannot wait until the next business day, you can page your doctor at the number below.    Please note that while we do our best to be available for urgent issues outside of office hours, we are not available 24/7.   If you have an urgent issue and are unable to reach us , you may choose to seek medical care at your doctor's office, retail clinic, urgent care center, or emergency room.  If you have a medical emergency, please immediately call 911 or go to the emergency department.  Pager Numbers  - Dr. Hester: [phone]  - Dr. Jackquline: [phone]  - Dr. Claudene: [phone]   - Dr. Raymund: [phone]  In the event of inclement weather, please call our main line at [phone] for an update on the status of any delays or closures.  Dermatology Medication Tips:  Please keep the boxes that topical medications come in in order to help keep track of the instructions about where and how to use these. Pharmacies typically print the medication instructions only on the boxes and not directly on the medication tubes.   If your medication is too expensive, please contact our office at [phone] option 4 or send us  a message through MyChart.   We are unable to tell what your co-pay for medications will be in advance as this is different depending on your insurance coverage. However, we may be able to find a substitute medication at lower cost or fill out  paperwork to get insurance to cover a needed medication.   If a prior authorization is required to get your medication covered by your insurance company, please allow us  1-2 business days to complete this process.  Drug prices often vary depending on where the prescription is filled and some pharmacies may offer cheaper prices.  The website www.goodrx.com contains coupons for medications through different pharmacies. The prices here do not account for what the cost may be with help from insurance (it may be cheaper with your insurance), but the website can give you the price if you did not use any insurance.  - You can print the associated coupon and take it with your prescription to the pharmacy.  - You may also stop by our office during regular business hours and pick up a GoodRx coupon card.  - If you need your prescription sent electronically to a different pharmacy, notify our office through [facility] or by phone at [phone] option 4.     Si Usted Necesita Algo Despus de Su Visita  Tambin puede enviarnos un mensaje a travs de Clinical cytogeneticist. Por lo general respondemos a los mensajes de MyChart en el transcurso de 1 a 2 das hbiles.  Para renovar recetas, por favor pida a su farmacia que se ponga en contacto con nuestra oficina. Randi lakes de fax es [place] [phone].  Si tiene un asunto urgente cuando la clnica est cerrada y que no puede esperar hasta el siguiente da hbil, puede llamar/localizar a su doctor(a) al nmero que aparece a continuacin.   Por favor, tenga en cuenta que aunque hacemos todo lo posible para estar disponibles para asuntos urgentes fuera del horario de [place], no estamos disponibles las 24 horas del da, los 7 [address]  [address] de la [place].   Si tiene un problema urgente y no puede comunicarse con nosotros, puede optar por buscar atencin mdica  en el consultorio de su doctor(a), en una clnica privada, en un centro de atencin urgente o en una sala de  emergencias.  Si tiene Engineer, drilling, por favor llame inmediatamente al 911 o vaya a la sala de emergencias.  Nmeros de bper  - Dr. Hester: [phone]  - Dra. Jackquline: [phone]  - Dr. Claudene: [phone]  - Dra. Kitts: [phone]  En caso de inclemencias del [place], por favor llame a nuestra lnea principal al [phone] para una actualizacin sobre el estado de cualquier retraso o cierre.  Consejos para la medicacin en dermatologa: Por favor, guarde las cajas en las que vienen los medicamentos de uso tpico para ayudarle a seguir las instrucciones sobre dnde y cmo usarlos. Las farmacias generalmente imprimen las instrucciones del medicamento slo en las cajas y no directamente en los tubos del [place].   Si su medicamento es muy caro, por favor, pngase en contacto con landry rieger llamando al [phone] y presione la opcin 4 o  envenos un mensaje a travs de MyChart.   No podemos decirle cul ser su copago por los medicamentos por adelantado ya que esto es diferente dependiendo de la cobertura de su seguro. Sin embargo, es posible que podamos encontrar un medicamento sustituto a Audiological scientist un formulario para que el seguro cubra el medicamento que se considera necesario.   Si se requiere una autorizacin previa para que su compaa de seguros malta su medicamento, por favor permtanos de 1 a 2 das hbiles para completar este proceso.  Los precios de los medicamentos varan con frecuencia dependiendo del Environmental consultant de dnde se surte la receta y alguna farmacias pueden ofrecer precios ms baratos.  El sitio web www.goodrx.com tiene cupones para medicamentos de Health and safety inspector. Los precios aqu no tienen en cuenta lo que podra costar con la ayuda del seguro (puede ser ms barato con su seguro), pero el sitio web puede darle el precio si no utiliz Tourist information centre manager.  - Puede imprimir el cupn correspondiente y llevarlo con su receta a la  farmacia.  - Tambin puede pasar por nuestra oficina durante el horario de atencin regular y Education officer, museum una tarjeta de cupones de GoodRx.  - Si necesita que su receta se enve electrnicamente a una farmacia diferente, informe a nuestra oficina a travs de MyChart de [place] o por telfono llamando al [phone] y presione la opcin 4.

## 2024-07-03 NOTE — Progress Notes (Signed)
 Refill request received from [facility].

## 2024-07-03 NOTE — Progress Notes (Signed)
   Follow-Up Visit   Subjective  Frances Davidson is a 63 y.o. female who presents for the following: Atopic Dermatitis with prurigo nodularis. On Rinvoq  15 mg since 03/16/2024. Tolerating well. States rash is under very good control. Still has dark areas.    The following portions of the chart were reviewed this encounter and updated as appropriate: medications, allergies, medical history  Review of Systems:  No other skin or systemic complaints except as noted in HPI or Assessment and Plan.  Objective  Well appearing patient in no apparent distress; mood and affect are within normal limits.  Areas Examined: Face, arms, legs  Relevant physical exam findings are noted in the Assessment and Plan.    Assessment & Plan   ATOPIC DERMATITIS, UNSPECIFIED TYPE   Related Procedures Lipid Panel Related Medications Upadacitinib  ER 15 MG TB24 Take 1 tablet (15 mg total) by mouth daily. LONG-TERM USE OF HIGH-RISK MEDICATION   Related Procedures Lipid Panel Related Medications Upadacitinib  ER 15 MG TB24 Take 1 tablet (15 mg total) by mouth daily. COUNSELING AND COORDINATION OF CARE   MEDICATION MANAGEMENT     ATOPIC DERMATITIS, previously erythrodermic with prurigo nodularis, severe, clear on Rinvoq   FAILED pimecrolimus , clobetasol , triamcinolone , dupixent     Exam: PIH on arms, legs. No active eczema 0 % BSA  Chronic and persistent condition with duration or expected duration over one year. Condition is improving with treatment but not currently at goal.  Reviewed labs from 05/30/2024 and 06/29/2024. Acceptable blood counts, renal function, liver enzymes, electrolytes  Atopic dermatitis (eczema) is a chronic, relapsing, pruritic condition that can significantly affect quality of life. It is often associated with allergic rhinitis and/or asthma and can require treatment with topical medications, phototherapy, or in severe cases biologic injectable medication (Dupixent ; Adbry)  or Oral JAK inhibitors.  Treatment Plan: Continue Rinvoq  15 mg daily as directed. No side effects Get lipid panel drawn Advised dark coloration should face over time.   Recommend gentle skin care.   Return in about 6 months (around 01/01/2025).  I, Jill Parcell, CMA, am acting as scribe for Boneta Sharps, MD.   Documentation: I have reviewed the above documentation for accuracy and completeness, and I agree with the above.  Boneta Sharps, MD

## 2024-07-13 ENCOUNTER — Ambulatory Visit: Payer: Self-pay | Admitting: Dermatology

## 2024-07-13 LAB — LIPID PANEL
Chol/HDL Ratio: 1.5 ratio (ref 0.0–4.4)
Cholesterol, Total: 199 mg/dL (ref 100–199)
HDL: 133 mg/dL (ref >39)
LDL Chol Calc (NIH): 52 mg/dL (ref 0–99)
Triglycerides: 80 mg/dL (ref 0–149)
VLDL Cholesterol Cal: 14 mg/dL (ref 5–40)

## 2024-08-05 ENCOUNTER — Other Ambulatory Visit: Payer: Self-pay | Admitting: Certified Nurse Midwife

## 2024-08-14 ENCOUNTER — Ambulatory Visit
Admission: RE | Admit: 2024-08-14 | Discharge: 2024-08-14 | Disposition: A | Discharge status: Home / Self Care | Source: Ambulatory Visit | Attending: Nurse Practitioner | Admitting: Nurse Practitioner

## 2024-08-14 ENCOUNTER — Ambulatory Visit (INDEPENDENT_AMBULATORY_CARE_PROVIDER_SITE_OTHER): Admitting: Nurse Practitioner

## 2024-08-14 ENCOUNTER — Encounter: Payer: Self-pay | Admitting: Nurse Practitioner

## 2024-08-14 VITALS — BP 117/80 | HR 84 | Temp 98.9°F | Ht 65.51 in | Wt 144.4 lb

## 2024-08-14 DIAGNOSIS — M25552 Pain in left hip: Secondary | ICD-10-CM | POA: Diagnosis present

## 2024-08-14 DIAGNOSIS — Z780 Asymptomatic menopausal state: Secondary | ICD-10-CM

## 2024-08-14 DIAGNOSIS — E782 Mixed hyperlipidemia: Secondary | ICD-10-CM | POA: Diagnosis not present

## 2024-08-14 DIAGNOSIS — R0681 Apnea, not elsewhere classified: Secondary | ICD-10-CM

## 2024-08-14 DIAGNOSIS — Z1231 Encounter for screening mammogram for malignant neoplasm of breast: Secondary | ICD-10-CM

## 2024-08-14 MED ORDER — METHYLPREDNISOLONE 4 MG PO TBPK: ORAL_TABLET | ORAL | 0 refills | Status: AC

## 2024-08-14 MED ORDER — ATORVASTATIN CALCIUM 80 MG PO TABS: 80.0000 mg | ORAL_TABLET | Freq: Every day | ORAL | 1 refills | Status: AC

## 2024-08-14 NOTE — Patient Instructions (Addendum)
 Please call to schedule your mammogram and/or bone density: [facility] at [facility]  Address: [address], [place], [place] [address] Phone: [phone]  [facility] at [facility] [address] [place],  [place]  [address] Phone: [phone]

## 2024-08-14 NOTE — Progress Notes (Signed)
 BP 117/80 (BP Location: Right Arm, Patient Position: Sitting, Cuff Size: Normal)   Pulse 84   Temp 98.9 F (37.2 C) (Oral)   Ht 5' 5.51 (1.664 m)   Wt 144 lb 6.4 oz (65.5 kg)   LMP 04/21/2014 (Approximate)   SpO2 100%   BMI 23.66 kg/m    Subjective:    Patient ID: Frances Davidson, female    DOB: Feb 11, 1961, 63 y.o.   MRN: [id number]  HPI: Frances Davidson is a 63 y.o. female  Chief Complaint  Patient presents with   Follow-up    Patient has left hip pain for about 16-months    UNDIFFERENTIATED CONNECTIVE TISSUE DISORDER Patient states the itching, dry mouth.  She is taking planquenil She is still having some itching but it is less frequent and less severe than prior.  The Rheumatologist would like her to see Pulmonology.  She is no longer taking her gabapentin .  She is sleeping better.    Patient states she has been having left hip pain for two months.  She is having pain down into her buttocks.  Rates the pain 8/10.  Pain has been ongoing x 2 months.   Patient states she would like a sleep study.  Patient states she is waking up every 2 hours.  She does snore.  Her husband has witness her stop breathing.     08/14/2024    8:00 AM  Results of the Epworth flowsheet  Sitting and reading 1  Watching TV 2  Sitting, inactive in a public place (e.g. a theatre or a meeting) 0  As a passenger in a car for an hour without a break 0  Lying down to rest in the afternoon when circumstances permit 1  Sitting and talking to someone 0  Sitting quietly after a lunch without alcohol 1  In a car, while stopped for a few minutes in traffic 0  Total score 5     Relevant past medical, surgical, family and social history reviewed and updated as indicated. Interim medical history since our last visit reviewed. Allergies and medications reviewed and updated.  Review of Systems  HENT:         Dry mouth  Respiratory:  Positive for apnea.        Snoring  Musculoskeletal:  Positive for  arthralgias.    Per HPI unless specifically indicated above     Objective:    BP 117/80 (BP Location: Right Arm, Patient Position: Sitting, Cuff Size: Normal)   Pulse 84   Temp 98.9 F (37.2 C) (Oral)   Ht 5' 5.51 (1.664 m)   Wt 144 lb 6.4 oz (65.5 kg)   LMP 04/21/2014 (Approximate)   SpO2 100%   BMI 23.66 kg/m   Wt Readings from Last 3 Encounters:  08/14/24 144 lb 6.4 oz (65.5 kg)  06/14/24 137 lb 5.6 oz (62.3 kg)  05/11/24 137 lb 6.4 oz (62.3 kg)    Physical Exam Vitals and nursing note reviewed.  Constitutional:      General: She is not in acute distress.    Appearance: Normal appearance. She is normal weight. She is not ill-appearing, toxic-appearing or diaphoretic.  HENT:     Head: Normocephalic.     Right Ear: External ear normal.     Left Ear: External ear normal.     Nose: Nose normal.     Mouth/Throat:     Mouth: Mucous membranes are moist.     Pharynx: Oropharynx is clear.  Eyes:  General:        Right eye: No discharge.        Left eye: No discharge.     Extraocular Movements: Extraocular movements intact.     Conjunctiva/sclera: Conjunctivae normal.     Pupils: Pupils are equal, round, and reactive to light.  Cardiovascular:     Rate and Rhythm: Normal rate and regular rhythm.     Heart sounds: No murmur heard. Pulmonary:     Effort: Pulmonary effort is normal. No respiratory distress.     Breath sounds: Normal breath sounds. No wheezing or rales.  Musculoskeletal:     Cervical back: Normal range of motion and neck supple.  Skin:    General: Skin is warm and dry.     Capillary Refill: Capillary refill takes less than 2 seconds.  Neurological:     General: No focal deficit present.     Mental Status: She is alert and oriented to person, place, and time. Mental status is at baseline.  Psychiatric:        Mood and Affect: Mood normal.        Behavior: Behavior normal.        Thought Content: Thought content normal.        Judgment: Judgment  normal.    Results for orders placed or performed in visit on 07/03/24  Lipid Panel   Collection Time: 07/12/24  9:37 AM  Result Value Ref Range   Cholesterol, Total 199 100 - 199 mg/dL   Triglycerides 80 0 - 149 mg/dL   HDL 866 >60 mg/dL   VLDL Cholesterol Cal 14 5 - 40 mg/dL   LDL Chol Calc (NIH) 52 0 - 99 mg/dL   Chol/HDL Ratio 1.5 0.0 - 4.4 ratio      Assessment & Plan:   Problem List Items Addressed This Visit       Other   Hyperlipidemia - Primary   Chronic.  Controlled on atorvastatin .  Increased dose to 80mg  daily.  Labs ordered at visit today.  Will make recommendations based on lab results. Follow up in 6 months.  Call sooner if concerns arise.       Relevant Medications   atorvastatin  (LIPITOR) 80 MG tablet   Other Relevant Orders   Comp Met (CMET)   Lipid Profile   Other Visit Diagnoses       Left hip pain       Xray ordered.  Prednisone  given to help with pain.  Follow up if not improved.   Relevant Orders   DG Hip Unilat W OR W/O Pelvis Min 4 Views Left     Post-menopausal       Bone density ordered during visit.   Relevant Orders   DG Bone Density     Apnea       Sleep study ordered.   Relevant Orders   Ambulatory referral to Sleep Studies     Encounter for screening mammogram for malignant neoplasm of breast       Relevant Orders   MM 3D SCREENING MAMMOGRAM BILATERAL BREAST         Follow up plan: Return in about 6 months (around 02/11/2025) for HTN, HLD, DM2 FU.

## 2024-08-14 NOTE — Assessment & Plan Note (Signed)
 Chronic.  Controlled on atorvastatin .  Increased dose to 80mg  daily.  Labs ordered at visit today.  Will make recommendations based on lab results. Follow up in 6 months.  Call sooner if concerns arise.

## 2024-08-15 LAB — COMPREHENSIVE METABOLIC PANEL WITH GFR
ALT: 20 IU/L (ref 0–32)
AST: 41 IU/L — ABNORMAL HIGH (ref 0–40)
Albumin: 4.5 g/dL (ref 3.9–4.9)
Alkaline Phosphatase: 90 IU/L (ref 49–135)
BUN/Creatinine Ratio: 19 (ref 12–28)
BUN: 13 mg/dL (ref 8–27)
Bilirubin Total: 0.6 mg/dL (ref 0.0–1.2)
CO2: 24 mmol/L (ref 20–29)
Calcium: 9.4 mg/dL (ref 8.7–10.3)
Chloride: 99 mmol/L (ref 96–106)
Creatinine, Ser: 0.69 mg/dL (ref 0.57–1.00)
Globulin, Total: 2.4 g/dL (ref 1.5–4.5)
Glucose: 78 mg/dL (ref 70–99)
Potassium: 4.2 mmol/L (ref 3.5–5.2)
Sodium: 139 mmol/L (ref 134–144)
Total Protein: 6.9 g/dL (ref 6.0–8.5)
eGFR: 96 mL/min/1.73 (ref >59)

## 2024-08-15 LAB — LIPID PANEL
Chol/HDL Ratio: 1.5 ratio (ref 0.0–4.4)
Cholesterol, Total: 194 mg/dL (ref 100–199)
HDL: 130 mg/dL (ref >39)
LDL Chol Calc (NIH): 49 mg/dL (ref 0–99)
Triglycerides: 85 mg/dL (ref 0–149)
VLDL Cholesterol Cal: 15 mg/dL (ref 5–40)

## 2024-08-21 ENCOUNTER — Ambulatory Visit: Payer: Self-pay | Admitting: Nurse Practitioner

## 2024-08-21 DIAGNOSIS — M25552 Pain in left hip: Secondary | ICD-10-CM

## 2024-09-12 ENCOUNTER — Ambulatory Visit
Admission: RE | Admit: 2024-09-12 | Discharge: 2024-09-12 | Disposition: A | Discharge status: Home / Self Care | Source: Ambulatory Visit | Attending: Nurse Practitioner | Admitting: Nurse Practitioner

## 2024-09-12 DIAGNOSIS — Z780 Asymptomatic menopausal state: Secondary | ICD-10-CM | POA: Diagnosis present

## 2024-09-12 DIAGNOSIS — Z1231 Encounter for screening mammogram for malignant neoplasm of breast: Secondary | ICD-10-CM | POA: Diagnosis present

## 2024-09-12 MED ORDER — ALENDRONATE SODIUM 70 MG PO TABS: 70.0000 mg | ORAL_TABLET | ORAL | 3 refills | Status: AC

## 2024-09-25 ENCOUNTER — Encounter: Payer: Self-pay | Admitting: Dermatology

## 2024-09-25 ENCOUNTER — Telehealth: Payer: Self-pay | Admitting: Dermatology

## 2024-09-25 DIAGNOSIS — Z79899 Other long term (current) drug therapy: Secondary | ICD-10-CM

## 2024-09-25 DIAGNOSIS — L209 Atopic dermatitis, unspecified: Secondary | ICD-10-CM

## 2024-09-25 DIAGNOSIS — L281 Prurigo nodularis: Secondary | ICD-10-CM

## 2024-09-25 MED ORDER — UPADACITINIB ER 15 MG PO TB24: 1.0000 | ORAL_TABLET | Freq: Every day | ORAL | 2 refills | Status: AC

## 2024-09-25 NOTE — Telephone Encounter (Signed)
 12/10 cbc cmp stable 11/24 lipid panel stable Continue rinvoq 

## 2024-09-27 ENCOUNTER — Other Ambulatory Visit: Payer: Self-pay | Admitting: Nurse Practitioner

## 2025-01-01 ENCOUNTER — Ambulatory Visit: Admitting: Dermatology

## 2025-02-13 ENCOUNTER — Ambulatory Visit: Admitting: Nurse Practitioner
